# Patient Record
Sex: Female | Born: 1965 | Race: Black or African American | Hispanic: No | Marital: Single | Smoking: Never smoker
Health system: Southern US, Community
[De-identification: ages and names within clinical notes are randomized; demographics above are authoritative.]

## PROBLEM LIST (undated history)

## (undated) DIAGNOSIS — R0602 Shortness of breath: Secondary | ICD-10-CM

## (undated) DIAGNOSIS — G932 Benign intracranial hypertension: Secondary | ICD-10-CM

## (undated) DIAGNOSIS — Z9989 Dependence on other enabling machines and devices: Secondary | ICD-10-CM

## (undated) DIAGNOSIS — M549 Dorsalgia, unspecified: Secondary | ICD-10-CM

## (undated) DIAGNOSIS — G4733 Obstructive sleep apnea (adult) (pediatric): Secondary | ICD-10-CM

## (undated) DIAGNOSIS — R1013 Epigastric pain: Secondary | ICD-10-CM

## (undated) DIAGNOSIS — I10 Essential (primary) hypertension: Secondary | ICD-10-CM

## (undated) DIAGNOSIS — G43909 Migraine, unspecified, not intractable, without status migrainosus: Secondary | ICD-10-CM

## (undated) HISTORY — DX: Epigastric pain: R10.13

## (undated) HISTORY — DX: Obstructive sleep apnea (adult) (pediatric): G47.33

## (undated) HISTORY — DX: Dependence on other enabling machines and devices: Z99.89

## (undated) HISTORY — DX: Migraine, unspecified, not intractable, without status migrainosus: G43.909

## (undated) HISTORY — DX: Benign intracranial hypertension: G93.2

## (undated) HISTORY — DX: Dorsalgia, unspecified: M54.9

## (undated) HISTORY — DX: Shortness of breath: R06.02

---

## 2010-02-16 ENCOUNTER — Encounter: Admission: RE | Admit: 2010-02-16 | Discharge: 2010-02-16 | Payer: Self-pay | Admitting: Obstetrics and Gynecology

## 2010-03-04 ENCOUNTER — Ambulatory Visit (HOSPITAL_COMMUNITY): Admission: RE | Admit: 2010-03-04 | Discharge: 2010-03-04 | Payer: Self-pay | Admitting: Cardiology

## 2010-03-04 ENCOUNTER — Encounter (INDEPENDENT_AMBULATORY_CARE_PROVIDER_SITE_OTHER): Payer: Self-pay | Admitting: Cardiology

## 2010-04-12 ENCOUNTER — Encounter: Admission: RE | Admit: 2010-04-12 | Discharge: 2010-04-12 | Payer: Self-pay | Admitting: Obstetrics and Gynecology

## 2010-07-27 ENCOUNTER — Inpatient Hospital Stay (HOSPITAL_COMMUNITY): Admission: AD | Admit: 2010-07-27 | Discharge: 2010-07-27 | Payer: Self-pay | Admitting: Family Medicine

## 2010-10-21 ENCOUNTER — Ambulatory Visit
Admission: RE | Admit: 2010-10-21 | Discharge: 2010-10-21 | Payer: Self-pay | Source: Home / Self Care | Attending: Internal Medicine | Admitting: Internal Medicine

## 2011-01-20 LAB — WET PREP, GENITAL: Yeast Wet Prep HPF POC: NONE SEEN

## 2011-01-20 LAB — URINALYSIS, ROUTINE W REFLEX MICROSCOPIC
Bilirubin Urine: NEGATIVE
Ketones, ur: NEGATIVE mg/dL
Leukocytes, UA: NEGATIVE
Nitrite: NEGATIVE
Protein, ur: NEGATIVE mg/dL
pH: 5.5 (ref 5.0–8.0)

## 2011-01-20 LAB — GC/CHLAMYDIA PROBE AMP, GENITAL
Chlamydia, DNA Probe: NEGATIVE
GC Probe Amp, Genital: NEGATIVE

## 2011-01-20 LAB — URINE MICROSCOPIC-ADD ON

## 2011-03-28 ENCOUNTER — Other Ambulatory Visit: Payer: Self-pay | Admitting: Obstetrics and Gynecology

## 2011-03-30 ENCOUNTER — Other Ambulatory Visit: Payer: Self-pay

## 2011-03-30 ENCOUNTER — Other Ambulatory Visit: Payer: Self-pay | Admitting: Obstetrics and Gynecology

## 2011-03-30 ENCOUNTER — Other Ambulatory Visit (HOSPITAL_COMMUNITY)
Admission: RE | Admit: 2011-03-30 | Discharge: 2011-03-30 | Disposition: A | Discharge status: Home / Self Care | Payer: Medicaid Other | Source: Ambulatory Visit | Attending: Obstetrics and Gynecology | Admitting: Obstetrics and Gynecology

## 2011-03-30 DIAGNOSIS — Z1231 Encounter for screening mammogram for malignant neoplasm of breast: Secondary | ICD-10-CM

## 2011-03-30 DIAGNOSIS — Z113 Encounter for screening for infections with a predominantly sexual mode of transmission: Secondary | ICD-10-CM | POA: Insufficient documentation

## 2011-03-30 DIAGNOSIS — Z124 Encounter for screening for malignant neoplasm of cervix: Secondary | ICD-10-CM | POA: Insufficient documentation

## 2011-04-07 ENCOUNTER — Ambulatory Visit
Admission: RE | Admit: 2011-04-07 | Discharge: 2011-04-07 | Disposition: A | Discharge status: Home / Self Care | Payer: Medicaid Other | Source: Ambulatory Visit | Attending: Obstetrics and Gynecology | Admitting: Obstetrics and Gynecology

## 2011-04-07 DIAGNOSIS — Z1231 Encounter for screening mammogram for malignant neoplasm of breast: Secondary | ICD-10-CM

## 2011-07-29 ENCOUNTER — Emergency Department (HOSPITAL_COMMUNITY): Payer: Medicaid Other

## 2011-07-29 ENCOUNTER — Encounter (HOSPITAL_COMMUNITY): Payer: Self-pay | Admitting: Radiology

## 2011-07-29 ENCOUNTER — Emergency Department (HOSPITAL_COMMUNITY)
Admission: EM | Admit: 2011-07-29 | Discharge: 2011-07-29 | Disposition: A | Discharge status: Home / Self Care | Payer: Medicaid Other | Attending: Emergency Medicine | Admitting: Emergency Medicine

## 2011-07-29 DIAGNOSIS — I10 Essential (primary) hypertension: Secondary | ICD-10-CM | POA: Insufficient documentation

## 2011-07-29 DIAGNOSIS — R079 Chest pain, unspecified: Secondary | ICD-10-CM | POA: Insufficient documentation

## 2011-07-29 DIAGNOSIS — R51 Headache: Secondary | ICD-10-CM | POA: Insufficient documentation

## 2011-07-29 DIAGNOSIS — G932 Benign intracranial hypertension: Secondary | ICD-10-CM | POA: Insufficient documentation

## 2011-07-29 HISTORY — DX: Essential (primary) hypertension: I10

## 2011-07-29 LAB — D-DIMER, QUANTITATIVE: D-Dimer, Quant: 0.39 ug/mL-FEU (ref 0.00–0.48)

## 2011-07-29 LAB — DIFFERENTIAL
Eosinophils Absolute: 0.1 10*3/uL (ref 0.0–0.7)
Eosinophils Relative: 2 % (ref 0–5)
Lymphocytes Relative: 36 % (ref 12–46)
Lymphs Abs: 1.5 10*3/uL (ref 0.7–4.0)
Monocytes Absolute: 0.4 10*3/uL (ref 0.1–1.0)

## 2011-07-29 LAB — COMPREHENSIVE METABOLIC PANEL
BUN: 12 mg/dL (ref 6–23)
CO2: 26 mEq/L (ref 19–32)
Calcium: 8.9 mg/dL (ref 8.4–10.5)
Creatinine, Ser: 0.85 mg/dL (ref 0.50–1.10)
GFR calc Af Amer: >60 mL/min (ref >60)
GFR calc non Af Amer: >60 mL/min (ref >60)
Glucose, Bld: 115 mg/dL — ABNORMAL HIGH (ref 70–99)
Total Protein: 7.4 g/dL (ref 6.0–8.3)

## 2011-07-29 LAB — CBC
HCT: 35.7 % — ABNORMAL LOW (ref 36.0–46.0)
MCHC: 33.3 g/dL (ref 30.0–36.0)
MCV: 87.5 fL (ref 78.0–100.0)
Platelets: 248 10*3/uL (ref 150–400)
RDW: 12.6 % (ref 11.5–15.5)

## 2011-07-29 LAB — POCT I-STAT TROPONIN I: Troponin i, poc: 0 ng/mL (ref 0.00–0.08)

## 2011-12-01 ENCOUNTER — Encounter (HOSPITAL_COMMUNITY): Payer: Self-pay

## 2011-12-01 ENCOUNTER — Other Ambulatory Visit: Payer: Self-pay

## 2011-12-01 ENCOUNTER — Emergency Department (HOSPITAL_COMMUNITY): Payer: Medicaid Other

## 2011-12-01 ENCOUNTER — Emergency Department (HOSPITAL_COMMUNITY)
Admission: EM | Admit: 2011-12-01 | Discharge: 2011-12-01 | Disposition: A | Discharge status: Home / Self Care | Payer: Medicaid Other | Attending: Emergency Medicine | Admitting: Emergency Medicine

## 2011-12-01 DIAGNOSIS — R079 Chest pain, unspecified: Secondary | ICD-10-CM | POA: Insufficient documentation

## 2011-12-01 DIAGNOSIS — R609 Edema, unspecified: Secondary | ICD-10-CM | POA: Insufficient documentation

## 2011-12-01 DIAGNOSIS — M25473 Effusion, unspecified ankle: Secondary | ICD-10-CM | POA: Insufficient documentation

## 2011-12-01 DIAGNOSIS — I10 Essential (primary) hypertension: Secondary | ICD-10-CM | POA: Insufficient documentation

## 2011-12-01 DIAGNOSIS — M25476 Effusion, unspecified foot: Secondary | ICD-10-CM | POA: Insufficient documentation

## 2011-12-01 DIAGNOSIS — R0602 Shortness of breath: Secondary | ICD-10-CM | POA: Insufficient documentation

## 2011-12-01 LAB — COMPREHENSIVE METABOLIC PANEL
AST: 30 U/L (ref 0–37)
BUN: 12 mg/dL (ref 6–23)
CO2: 25 mEq/L (ref 19–32)
Calcium: 9.2 mg/dL (ref 8.4–10.5)
Creatinine, Ser: 0.88 mg/dL (ref 0.50–1.10)
GFR calc Af Amer: >90 mL/min (ref >90)
GFR calc non Af Amer: 79 mL/min — ABNORMAL LOW (ref >90)

## 2011-12-01 LAB — CBC
MCH: 29 pg (ref 26.0–34.0)
MCV: 89.4 fL (ref 78.0–100.0)
Platelets: 248 10*3/uL (ref 150–400)
RBC: 4.17 MIL/uL (ref 3.87–5.11)

## 2011-12-01 LAB — POCT I-STAT TROPONIN I
Troponin i, poc: 0 ng/mL (ref 0.00–0.08)
Troponin i, poc: 0 ng/mL (ref 0.00–0.08)

## 2011-12-01 MED ORDER — ASPIRIN 81 MG PO CHEW
324.0000 mg | CHEWABLE_TABLET | Freq: Once | ORAL | Status: AC
  Administered 2011-12-01: 324 mg via ORAL
  Filled 2011-12-01: qty 4

## 2011-12-01 MED ORDER — ONDANSETRON HCL 4 MG/2ML IJ SOLN
4.0000 mg | Freq: Once | INTRAMUSCULAR | Status: AC
  Administered 2011-12-01: 4 mg via INTRAVENOUS
  Filled 2011-12-01: qty 2

## 2011-12-01 MED ORDER — SODIUM CHLORIDE 0.9 % IV SOLN
20.0000 mL | INTRAVENOUS | Status: DC
  Administered 2011-12-01: 20 mL via INTRAVENOUS

## 2011-12-01 MED ORDER — MORPHINE SULFATE 4 MG/ML IJ SOLN: 4.0000 mg | Freq: Once | INTRAMUSCULAR | Status: DC

## 2011-12-01 NOTE — ED Provider Notes (Signed)
History     CSN: [id number]  Arrival date & time 12/01/11  0907   First MD Initiated Contact with Patient 12/01/11 [phone]      Chief Complaint  Patient presents with  . Chest Pain    (Consider location/radiation/quality/duration/timing/severity/associated sxs/prior treatment) HPI Comments: Patient w a hx of HTN and obesity presents to the emergency department with a chief complaint of left-sided chest pain.  Her pain started last night was intermittent in nature and lasts a couple of minutes.  Patient states she woke up this morning and the pain still comes and goes.  She describes it as a pinching on and off, ranks it as a 4/10 in severity and denies radiation.  The patient did not have any left arm pain or jaw pain.  Patient states that her pain did not get worse with exercise or deep breathing.  Patient has not taken any medications to alleviate symptoms and is unaware of alleviating factors.  Patient denies shortness of breath or dyspnea on exertion and claudication.  She states that she has chronic bilateral ankle swelling occasionally.  Patient also reports that she has sleep apnea but does not currently use a machine.  Patient's last echo was performed on able 28th 2011 showing an ejection fraction of 60-65%.  At this time she had a stress test as well within normal limits.  The patient's cardiologist is Dr. Mayford Knife but she has not seen her since this last evaluation  Patient is a 46 y.o. female presenting with chest pain.  Chest Pain Primary symptoms include shortness of breath. Pertinent negatives for primary symptoms include no fever, no fatigue, no cough, no wheezing, no palpitations, no abdominal pain, no nausea, no vomiting and no dizziness.  Pertinent negatives for associated symptoms include no diaphoresis.     Past Medical History  Diagnosis Date  . Hypertension     History reviewed. No pertinent past surgical history.  No family history on file.  History  Substance Use  Topics  . Smoking status: Not on file  . Smokeless tobacco: Not on file  . Alcohol Use: Not on file    OB History    Grav Para Term Preterm Abortions TAB SAB Ect Mult Living                  Review of Systems  Constitutional: Positive for activity change. Negative for fever, chills, diaphoresis, fatigue and unexpected weight change.  HENT: Negative for congestion, neck pain and neck stiffness.   Eyes: Negative for visual disturbance.  Respiratory: Positive for chest tightness and shortness of breath. Negative for apnea, cough, wheezing and stridor.   Cardiovascular: Positive for chest pain. Negative for palpitations and leg swelling.  Gastrointestinal: Negative for nausea, vomiting, abdominal pain, diarrhea and blood in stool.  Genitourinary: Negative for dysuria, urgency, hematuria and flank pain.  Musculoskeletal: Negative for myalgias, back pain and gait problem.  Skin: Negative for pallor.  Neurological: Negative for dizziness, syncope, light-headedness and headaches.  All other systems reviewed and are negative.    Allergies  Lotrel  Home Medications  No current outpatient prescriptions on file.  BP 131/75  Pulse 86  Temp(Src) 98 F (36.7 C) (Oral)  Resp 21  SpO2 96%  LMP 11/24/2011  Physical Exam  Nursing note and vitals reviewed. Constitutional: She appears well-developed and well-nourished. No distress.  HENT:  Head: Normocephalic and atraumatic.  Eyes: Conjunctivae and EOM are normal. Pupils are equal, round, and reactive to light.  Neck: Normal  range of motion. Neck supple. Normal carotid pulses and no JVD present. Carotid bruit is not present. No rigidity. Normal range of motion present.  Cardiovascular: Normal rate, regular rhythm, S1 normal, S2 normal, normal heart sounds, intact distal pulses and normal pulses.  Exam reveals no gallop and no friction rub.   No murmur heard.      No pitting edema bilaterally, RRR, no aberrant sounds on auscultations,  distal pulses intact, no carotid bruit or JVD.   Pulmonary/Chest: Effort normal and breath sounds normal. No accessory muscle usage or stridor. No respiratory distress. She exhibits no tenderness and no bony tenderness.       Chest symmetry with respirations, no wheezes, rhonchi or rales, No reproducible chest wall tenderness.   Abdominal: Bowel sounds are normal.       Soft non tender. Non pulsatile aorta.   Skin: Skin is warm, dry and intact. No rash noted. She is not diaphoretic. No cyanosis. Nails show no clubbing.    ED Course  Procedures (including critical care time)  Labs Reviewed - No data to display No results found.   No diagnosis found.   Date: 12/01/2011  Rate: 81  Rhythm: normal sinus rhythm  QRS Axis: normal  Intervals: normal  ST/T Wave abnormalities: normal  Conduction Disutrbances:none  Narrative Interpretation:   Old EKG Reviewed: No significant changes noted  Plan is to check troponin x2, labs & CXR. If WNL pt will be dc with instructions to f-u w Dr. Mayford Knife, her cardiologist.  Re evaluated prior to dc: Gen NAD; Heart RRR, nml S1,S2, no m/r/g; Lungs CTAB; Abd soft, NT, no rebound or guarding; Ext 2+ pedal pulses bilaterally, no edema.  MDM  Chest Pain  Pt being discharged with instructions to follow up w cardiologist next week for further work up and labs. Pts pain is non exertional, findings in the ED are negative for acute MI, and pt appears to be reliable to follow up as an OP for her CP. Reasons to return to ED have been discussed. Pt is agreeable with above plan.      Linda Cunningham, [place] 12/01/11 1252

## 2011-12-01 NOTE — ED Notes (Signed)
Patient transported to X-ray 

## 2011-12-01 NOTE — ED Notes (Signed)
Urine sample collected from patient if needed.

## 2011-12-01 NOTE — ED Notes (Signed)
Pt states left upper chest pain started last night continuing through the night into this morning. She also stated that she has had this same pain before over the last few months. No SOB per pt.

## 2011-12-01 NOTE — ED Provider Notes (Signed)
Medical screening examination/treatment/procedure(s) were conducted as a shared visit with non-physician practitioner(s) and myself.  I personally evaluated the patient during the encounter  Cyndra Numbers, MD 12/01/11 2242

## 2011-12-01 NOTE — ED Notes (Signed)
Pt presents with L sided chest pain since last night.  Pt reports pain is intermittent and does not radiate. -shortness of breath or nausea.  Pt has had similar episodes.

## 2011-12-01 NOTE — ED Notes (Signed)
NAD noted at d/c. Pt verbalized understanding of d/c instructions  

## 2012-04-02 ENCOUNTER — Other Ambulatory Visit (HOSPITAL_COMMUNITY)
Admission: RE | Admit: 2012-04-02 | Discharge: 2012-04-02 | Disposition: A | Discharge status: Home / Self Care | Payer: Medicaid Other | Source: Ambulatory Visit | Attending: Obstetrics and Gynecology | Admitting: Obstetrics and Gynecology

## 2012-04-02 ENCOUNTER — Other Ambulatory Visit: Payer: Self-pay | Admitting: Obstetrics and Gynecology

## 2012-04-02 DIAGNOSIS — Z124 Encounter for screening for malignant neoplasm of cervix: Secondary | ICD-10-CM | POA: Insufficient documentation

## 2012-04-04 ENCOUNTER — Other Ambulatory Visit: Payer: Self-pay | Admitting: Obstetrics and Gynecology

## 2012-04-04 DIAGNOSIS — Z1231 Encounter for screening mammogram for malignant neoplasm of breast: Secondary | ICD-10-CM

## 2012-04-13 ENCOUNTER — Ambulatory Visit
Admission: RE | Admit: 2012-04-13 | Discharge: 2012-04-13 | Disposition: A | Discharge status: Home / Self Care | Payer: Medicaid Other | Source: Ambulatory Visit | Attending: Obstetrics and Gynecology | Admitting: Obstetrics and Gynecology

## 2012-04-13 DIAGNOSIS — Z1231 Encounter for screening mammogram for malignant neoplasm of breast: Secondary | ICD-10-CM

## 2012-04-27 ENCOUNTER — Ambulatory Visit: Payer: Medicaid Other

## 2013-03-13 ENCOUNTER — Other Ambulatory Visit: Payer: Self-pay

## 2013-03-18 ENCOUNTER — Other Ambulatory Visit: Payer: Self-pay | Admitting: Family Medicine

## 2013-03-18 DIAGNOSIS — N644 Mastodynia: Secondary | ICD-10-CM

## 2013-04-15 ENCOUNTER — Other Ambulatory Visit: Payer: Self-pay | Admitting: Family Medicine

## 2013-04-15 ENCOUNTER — Ambulatory Visit
Admission: RE | Admit: 2013-04-15 | Discharge: 2013-04-15 | Disposition: A | Discharge status: Home / Self Care | Payer: Medicaid Other | Source: Ambulatory Visit | Attending: Family Medicine | Admitting: Family Medicine

## 2013-04-15 DIAGNOSIS — N644 Mastodynia: Secondary | ICD-10-CM

## 2013-11-15 ENCOUNTER — Other Ambulatory Visit: Payer: Self-pay | Admitting: Family Medicine

## 2013-11-15 DIAGNOSIS — N644 Mastodynia: Secondary | ICD-10-CM

## 2013-11-25 ENCOUNTER — Other Ambulatory Visit: Payer: Self-pay | Admitting: Family Medicine

## 2013-11-25 ENCOUNTER — Ambulatory Visit
Admission: RE | Admit: 2013-11-25 | Discharge: 2013-11-25 | Disposition: A | Discharge status: Home / Self Care | Payer: Medicaid Other | Source: Ambulatory Visit | Attending: Family Medicine | Admitting: Family Medicine

## 2013-11-25 DIAGNOSIS — N644 Mastodynia: Secondary | ICD-10-CM

## 2013-12-09 ENCOUNTER — Other Ambulatory Visit: Payer: Self-pay | Admitting: Family Medicine

## 2013-12-09 DIAGNOSIS — N644 Mastodynia: Secondary | ICD-10-CM

## 2013-12-11 ENCOUNTER — Encounter: Payer: Self-pay | Admitting: Cardiology

## 2013-12-11 ENCOUNTER — Encounter: Payer: Self-pay | Admitting: General Surgery

## 2013-12-11 ENCOUNTER — Ambulatory Visit
Admission: RE | Admit: 2013-12-11 | Discharge: 2013-12-11 | Disposition: A | Discharge status: Home / Self Care | Payer: Medicaid Other | Source: Ambulatory Visit | Attending: Family Medicine | Admitting: Family Medicine

## 2013-12-11 DIAGNOSIS — N644 Mastodynia: Secondary | ICD-10-CM

## 2013-12-11 DIAGNOSIS — I10 Essential (primary) hypertension: Secondary | ICD-10-CM

## 2013-12-11 HISTORY — DX: Essential (primary) hypertension: I10

## 2013-12-24 ENCOUNTER — Ambulatory Visit: Payer: Medicaid Other | Admitting: Cardiology

## 2014-01-21 ENCOUNTER — Encounter: Payer: Self-pay | Admitting: Cardiology

## 2014-01-21 ENCOUNTER — Ambulatory Visit (INDEPENDENT_AMBULATORY_CARE_PROVIDER_SITE_OTHER): Payer: Medicaid Other | Admitting: Cardiology

## 2014-01-21 VITALS — BP 138/74 | HR 82 | Ht 60.0 in | Wt 244.0 lb

## 2014-01-21 DIAGNOSIS — I10 Essential (primary) hypertension: Secondary | ICD-10-CM

## 2014-01-21 NOTE — Patient Instructions (Addendum)
Your physician recommends that you continue on your current medications as directed. Please refer to the Current Medication list given to you today.  Your physician wants you to follow-up in: 12 Months with Dr Turner You will receive a reminder letter in the mail two months in advance. If you don't receive a letter, please call our office to schedule the follow-up appointment.  

## 2014-01-21 NOTE — Progress Notes (Signed)
  [address] [place], [place]  [address] Phone: [phone] Fax:  [phone]  Date:  01/21/2014   ID:  Margarita Ranandrea D Adebayo, DOB 1966/05/23, MRN [id number]  PCP:  Jackie PlumSEI-BONSU,GEORGE, MD  Cardiologist:  Armanda Magicraci Turner, MD     History of Present Illness: Margarita Ranandrea D Borsuk is a 48 y.o. female with a history of HTN who presents today for followup.  She is doing well.  She denies any SOB, DOE, LE edema, dizziness, palpitations or syncope.  She occasionally has a pinching sensation in her chest over her sternum.   Wt Readings from Last 3 Encounters:  01/21/14 244 lb (110.678 kg)     Past Medical History  Diagnosis Date  . Hypertension   . Pseudotumor cerebri     Current Outpatient Prescriptions  Medication Sig Dispense Refill  . acetaZOLAMIDE (DIAMOX) 125 MG tablet Take 125 mg by mouth 2 (two) times daily.      Marland Kitchen. amLODipine (NORVASC) 5 MG tablet Take 5 mg by mouth daily.      Marland Kitchen. ibuprofen (ADVIL,MOTRIN) 200 MG tablet Take 400 mg by mouth every 6 (six) hours as needed. For pain      . olmesartan (BENICAR) 20 MG tablet Take 20 mg by mouth daily.      Marland Kitchen. topiramate (TOPAMAX) 25 MG capsule Take 75 mg by mouth daily.       No current facility-administered medications for this visit.    Allergies:    Allergies  Allergen Reactions  . Amlodipine Besy-Benazepril Hcl Swelling    LIP swelling    Social History:  The patient  reports that she has never smoked. She does not have any smokeless tobacco history on file. She reports that she does not drink alcohol or use illicit drugs.   Family History:  The patient's family history includes Coronary artery disease in her brother; Hypertension in her brother, mother, and sister.   ROS:  Please see the history of present illness.      All other systems reviewed and negative.   PHYSICAL EXAM: VS:  BP 138/74  Pulse 82  Ht 5' (1.524 m)  Wt 244 lb (110.678 kg)  BMI 47.65 kg/m2 Well nourished, well developed, in no acute  distress HEENT: normal Neck: no JVD Cardiac:  normal S1, S2; RRR; no murmur Lungs:  clear to auscultation bilaterally, no wheezing, rhonchi or rales Abd: soft, nontender, no hepatomegaly Ext: no edema Skin: warm and dry Neuro:  CNs 2-12 intact, no focal abnormalities noted      EKG:  NSR  ASSESSMENT AND PLAN:  1. HTN well controlled on current regimen.  - continue amlodipine/Benicar  Followup with me in 6 months  Signed, Armanda Magicraci Turner, MD 01/21/2014 8:28 AM

## 2014-04-09 ENCOUNTER — Other Ambulatory Visit: Payer: Self-pay

## 2014-04-09 DIAGNOSIS — Z1231 Encounter for screening mammogram for malignant neoplasm of breast: Secondary | ICD-10-CM

## 2014-04-21 ENCOUNTER — Ambulatory Visit
Admission: RE | Admit: 2014-04-21 | Discharge: 2014-04-21 | Disposition: A | Discharge status: Home / Self Care | Payer: Medicaid Other | Source: Ambulatory Visit

## 2014-04-21 DIAGNOSIS — Z1231 Encounter for screening mammogram for malignant neoplasm of breast: Secondary | ICD-10-CM

## 2014-07-10 ENCOUNTER — Telehealth: Payer: Self-pay | Admitting: Cardiology

## 2014-07-10 NOTE — Telephone Encounter (Signed)
New message      Pt said we have her PCP listed wrong.  Her PCP is Dr Anna Genre at [facility] [phone]

## 2014-07-10 NOTE — Telephone Encounter (Signed)
Pt's chart updated with correct PCP.

## 2014-07-21 ENCOUNTER — Encounter: Payer: Self-pay | Admitting: *Deleted

## 2014-07-21 ENCOUNTER — Encounter: Payer: Medicaid Other | Attending: Family Medicine | Admitting: *Deleted

## 2014-07-21 VITALS — Ht <= 58 in | Wt 247.6 lb

## 2014-07-21 DIAGNOSIS — Z6841 Body Mass Index (BMI) 40.0 and over, adult: Secondary | ICD-10-CM | POA: Diagnosis not present

## 2014-07-21 DIAGNOSIS — E669 Obesity, unspecified: Secondary | ICD-10-CM | POA: Insufficient documentation

## 2014-07-21 DIAGNOSIS — Z713 Dietary counseling and surveillance: Secondary | ICD-10-CM | POA: Diagnosis not present

## 2014-07-21 NOTE — Progress Notes (Signed)
Medical Nutrition Therapy:  Appt start time: 1045 end time:  1145  Assessment:  Patient here today for weight management. Patient desires weight loss. Her weight is down about 3.5 pounds over the last month. She tries to cook healthy, but states that she isn't always sure what to eat. She also has a sweet tooth, finding it hard to resist eating sweets when they are brought into the house. 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 Although, she does plan on joining the aquatic center to do pool exercises.   MEDICATIONS: See list   DIETARY INTAKE:   Usual eating pattern includes 3 meals and 2 snacks per day.  24-hr recall:  B ( AM): Oatmeal with 2% milk and pumpkin/sweet potato puree, raisins, water, juice Snk ( AM): None L ( PM): Sandwich ([place], cheese, mayo), fruit, water Snk ( PM): Chips, sweets, PB crackers D ( PM): [place] chili/meatloaf, non-starchy vegetables OR spaghetti, water/sweet tea Snk ( PM): Same Beverages: Water, juice, sweet tea, Kool-aid  Usual physical activity: None currently, planning on swimming  Estimated energy needs: 1500 calories 188 g carbohydrates 94 g protein 42 g fat  Progress Towards Goal(s):  In progress.   Nutritional Diagnosis:  [place]-3.3 Overweight/obesity As related to excessive intake of sweets and physical inactivity.  As evidenced by BMI >30.    Intervention:  Nutrition counseling. We discussed strategies for weight loss, including balancing nutrients (carbs, protein, fat), portion control, healthy snacks, and exercise.   Goals:  1. 1 pound weight loss per week. Goal weight: <200 pounds 2. Use plate method for meal planning 3. Read food labels for calories and serving size.  4. Choose healthy snacks 1-2 times daily for a maximum of 300 calories per day (fruit, lactose-free yogurt, graham crackers) 5. Work up to swimming and/or walking 20-30 minutes 3 days weekly.  6. Limit intake of juice and sweetened drinks.   Handouts  given during visit include:  Weight loss tips  Meal plan card  Monitoring/Evaluation:  Dietary intake, exercise, and body weight prn.

## 2014-11-13 ENCOUNTER — Encounter: Payer: Self-pay | Admitting: Cardiology

## 2014-12-15 ENCOUNTER — Telehealth: Payer: Self-pay | Admitting: Cardiology

## 2014-12-15 NOTE — Telephone Encounter (Signed)
Patient  C/o moderate chest tightness for 2 days.  She st it started a couple days ago when [place] and it stayed for the evening. She has had intermittent mild to moderate chest tightness and discomfort since then. Pt st she has gained about 5 pounds in the last couple weeks. She thinks some might be swelling but she has also been eating extremely poorly as well.  She does not have any SOB or other symptoms.  To Dr. Mayford Knifeurner for recommendations.

## 2014-12-15 NOTE — Telephone Encounter (Signed)
Needs to go to ER

## 2014-12-15 NOTE — Telephone Encounter (Signed)
New problem    Pt stated she is having chest pressure for a few days. Pt stated it isn't chest pain. Pt want to speak to nurse.

## 2014-12-15 NOTE — Telephone Encounter (Signed)
Informed patient to go to the ER. Patient st she is not having any symptoms now, but will go if she does. Instructed patient to F/U with us later this week.

## 2014-12-21 ENCOUNTER — Encounter (HOSPITAL_COMMUNITY): Payer: Self-pay | Admitting: Emergency Medicine

## 2014-12-21 ENCOUNTER — Emergency Department (HOSPITAL_COMMUNITY)
Admission: EM | Admit: 2014-12-21 | Discharge: 2014-12-21 | Disposition: A | Discharge status: Home / Self Care | Payer: Medicaid Other | Attending: Emergency Medicine | Admitting: Emergency Medicine

## 2014-12-21 ENCOUNTER — Emergency Department (HOSPITAL_COMMUNITY): Payer: Medicaid Other

## 2014-12-21 DIAGNOSIS — Z79899 Other long term (current) drug therapy: Secondary | ICD-10-CM | POA: Diagnosis not present

## 2014-12-21 DIAGNOSIS — R079 Chest pain, unspecified: Secondary | ICD-10-CM | POA: Insufficient documentation

## 2014-12-21 DIAGNOSIS — I10 Essential (primary) hypertension: Secondary | ICD-10-CM | POA: Diagnosis not present

## 2014-12-21 DIAGNOSIS — Z8669 Personal history of other diseases of the nervous system and sense organs: Secondary | ICD-10-CM | POA: Diagnosis not present

## 2014-12-21 LAB — I-STAT TROPONIN, ED: Troponin i, poc: 0 ng/mL (ref 0.00–0.08)

## 2014-12-21 LAB — CBC
HCT: 37.7 % (ref 36.0–46.0)
Hemoglobin: 11.7 g/dL — ABNORMAL LOW (ref 12.0–15.0)
MCH: 28.6 pg (ref 26.0–34.0)
MCHC: 31 g/dL (ref 30.0–36.0)
MCV: 92.2 fL (ref 78.0–100.0)
PLATELETS: 309 10*3/uL (ref 150–400)
RBC: 4.09 MIL/uL (ref 3.87–5.11)
RDW: 13.1 % (ref 11.5–15.5)
WBC: 4.6 10*3/uL (ref 4.0–10.5)

## 2014-12-21 LAB — BASIC METABOLIC PANEL
Anion gap: 6 (ref 5–15)
BUN: 17 mg/dL (ref 6–23)
CO2: 23 mmol/L (ref 19–32)
Calcium: 9 mg/dL (ref 8.4–10.5)
Chloride: 107 mmol/L (ref 96–112)
Creatinine, Ser: 0.98 mg/dL (ref 0.50–1.10)
GFR calc Af Amer: 79 mL/min — ABNORMAL LOW (ref >90)
GFR, EST NON AFRICAN AMERICAN: 68 mL/min — AB (ref >90)
Glucose, Bld: 119 mg/dL — ABNORMAL HIGH (ref 70–99)
POTASSIUM: 3.9 mmol/L (ref 3.5–5.1)
Sodium: 136 mmol/L (ref 135–145)

## 2014-12-21 LAB — BRAIN NATRIURETIC PEPTIDE: B Natriuretic Peptide: 28.4 pg/mL (ref 0.0–100.0)

## 2014-12-21 MED ORDER — ASPIRIN 81 MG PO CHEW
324.0000 mg | CHEWABLE_TABLET | Freq: Once | ORAL | Status: AC
  Administered 2014-12-21: 324 mg via ORAL
  Filled 2014-12-21: qty 4

## 2014-12-21 MED ORDER — GI COCKTAIL ~~LOC~~
30.0000 mL | Freq: Once | ORAL | Status: AC
  Administered 2014-12-21: 30 mL via ORAL
  Filled 2014-12-21: qty 30

## 2014-12-21 NOTE — Discharge Instructions (Signed)
Take maalox or pepcid at night time. Take aspirin 81mg  daily. Follow up with Dr. Mayford Knifeurner in the office as soon as able. Call tomorrow. Return if worsening symptoms.    Chest Pain (Nonspecific) It is often hard to give a specific diagnosis for the cause of chest pain. There is always a chance that your pain could be related to something serious, such as a heart attack or a blood clot in the lungs. You need to follow up with your health care provider for further evaluation. CAUSES   Heartburn.  Pneumonia or bronchitis.  Anxiety or stress.  Inflammation around your heart (pericarditis) or lung (pleuritis or pleurisy).  A blood clot in the lung.  A collapsed lung (pneumothorax). It can develop suddenly on its own (spontaneous pneumothorax) or from trauma to the chest.  Shingles infection (herpes zoster virus). The chest wall is composed of bones, muscles, and cartilage. Any of these can be the source of the pain.  The bones can be bruised by injury.  The muscles or cartilage can be strained by coughing or overwork.  The cartilage can be affected by inflammation and become sore (costochondritis). DIAGNOSIS  Lab tests or other studies may be needed to find the cause of your pain. Your health care provider may have you take a test called an ambulatory electrocardiogram (ECG). An ECG records your heartbeat patterns over a 24-hour period. You may also have other tests, such as:  Transthoracic echocardiogram (TTE). During echocardiography, sound waves are used to evaluate how blood flows through your heart.  Transesophageal echocardiogram (TEE).  Cardiac monitoring. This allows your health care provider to monitor your heart rate and rhythm in real time.  Holter monitor. This is a portable device that records your heartbeat and can help diagnose heart arrhythmias. It allows your health care provider to track your heart activity for several days, if needed.  Stress tests by exercise or by  giving medicine that makes the heart beat faster. TREATMENT   Treatment depends on what may be causing your chest pain. Treatment may include:  Acid blockers for heartburn.  Anti-inflammatory medicine.  Pain medicine for inflammatory conditions.  Antibiotics if an infection is present.  You may be advised to change lifestyle habits. This includes stopping smoking and avoiding alcohol, caffeine, and chocolate.  You may be advised to keep your head raised (elevated) when sleeping. This reduces the chance of acid going backward from your stomach into your esophagus. Most of the time, nonspecific chest pain will improve within 2-3 days with rest and mild pain medicine.  HOME CARE INSTRUCTIONS   If antibiotics were prescribed, take them as directed. Finish them even if you start to feel better.  For the next few days, avoid physical activities that bring on chest pain. Continue physical activities as directed.  Do not use any tobacco products, including cigarettes, chewing tobacco, or electronic cigarettes.  Avoid drinking alcohol.  Only take medicine as directed by your health care provider.  Follow your health care provider's suggestions for further testing if your chest pain does not go away.  Keep any follow-up appointments you made. If you do not go to an appointment, you could develop lasting (chronic) problems with pain. If there is any problem keeping an appointment, call to reschedule. SEEK MEDICAL CARE IF:   Your chest pain does not go away, even after treatment.  You have a rash with blisters on your chest.  You have a fever. SEEK IMMEDIATE MEDICAL CARE IF:  You have increased chest pain or pain that spreads to your arm, neck, jaw, back, or abdomen.  You have shortness of breath.  You have an increasing cough, or you cough up blood.  You have severe back or abdominal pain.  You feel nauseous or vomit.  You have severe weakness.  You faint.  You have  chills. This is an emergency. Do not wait to see if the pain will go away. Get medical help at once. Call your local emergency services (911 in [place] Do not drive yourself to the hospital. MAKE SURE YOU:   Understand these instructions.  Will watch your condition.  Will get help right away if you are not doing well or get worse. Document Released: 08/03/2005 Document Revised: 10/29/2013 Document Reviewed: 05/29/2008 [facility] Patient Information 2015 [place], [place]. This information is not intended to replace advice given to you by your health care provider. Make sure you discuss any questions you have with your health care provider.

## 2014-12-21 NOTE — ED Provider Notes (Signed)
CSN: [id number]     Arrival date & time 12/21/14  1908 History   First MD Initiated Contact with Patient 12/21/14 1921     Chief Complaint  Patient presents with  . Chest Pain     (Consider location/radiation/quality/duration/timing/severity/associated sxs/prior Treatment) HPI Linda Cunningham is a 49 y.o. female  With hx of hypertension and pseudotumor cerebri, presents to ED complaining of chest pain. Pt states pain is 'more of a discomfort' in the left chest radiating down left arm. States arm feels "tingly." States started this morning. Reports similar episodes in the last several days, that are worse with exertion. Some associated SOB. No dizziness or diaphoresis. Hx of prior work up for cp, states had stress test that was normal several years ago. States she is not a smoker. Unsure of her cholesterol. Heart disease in mother and brother.    Past Medical History  Diagnosis Date  . Hypertension   . Pseudotumor cerebri    Past Surgical History  Procedure Laterality Date  . Cesarean section     Family History  Problem Relation Age of Onset  . Hypertension Mother   . Diabetes Mother   . Hypertension Sister   . Hypertension Brother   . Coronary artery disease Brother    History  Substance Use Topics  . Smoking status: Never Smoker   . Smokeless tobacco: Not on file  . Alcohol Use: No   OB History    No data available     Review of Systems  Constitutional: Negative for fever and chills.  Respiratory: Positive for chest tightness. Negative for cough and shortness of breath.   Cardiovascular: Positive for chest pain. Negative for palpitations and leg swelling.  Gastrointestinal: Negative for nausea, vomiting, abdominal pain and diarrhea.  Genitourinary: Negative for dysuria, flank pain and pelvic pain.  Musculoskeletal: Negative for myalgias, arthralgias, neck pain and neck stiffness.  Skin: Negative for rash.  Neurological: Negative for dizziness, weakness and headaches.   All other systems reviewed and are negative.     Allergies  Amlodipine besy-benazepril hcl  Home Medications   Prior to Admission medications   Medication Sig Start Date End Date Taking? Authorizing Provider  acetaZOLAMIDE (DIAMOX) 125 MG tablet Take 125 mg by mouth 2 (two) times daily.   Yes Historical Provider, MD  amLODipine (NORVASC) 5 MG tablet Take 5 mg by mouth daily.   Yes Historical Provider, MD  Ascorbic Acid (VITAMIN C PO) Take 1 tablet by mouth daily.   Yes Historical Provider, MD  Cholecalciferol (VITAMIN D PO) Take 1 tablet by mouth daily.   Yes Historical Provider, MD  olmesartan (BENICAR) 20 MG tablet Take 20 mg by mouth daily.   Yes Historical Provider, MD  topiramate (TOPAMAX) 25 MG capsule Take 75 mg by mouth daily.   Yes Historical Provider, MD  ibuprofen (ADVIL,MOTRIN) 200 MG tablet Take 400 mg by mouth every 6 (six) hours as needed. For pain    Historical Provider, MD   BP 141/78 mmHg  Pulse 100  Temp(Src) 97.8 F (36.6 C) (Oral)  Resp 18  SpO2 99%  LMP 12/06/2014 (Exact Date) Physical Exam  Constitutional: She is oriented to person, place, and time. She appears well-developed and well-nourished. No distress.  HENT:  Head: Normocephalic.  Eyes: Conjunctivae are normal.  Neck: Neck supple.  Cardiovascular: Normal rate, regular rhythm and normal heart sounds.   Pulmonary/Chest: Effort normal and breath sounds normal. No respiratory distress. She has no wheezes. She has no rales. She  exhibits no tenderness.  Musculoskeletal: She exhibits no edema.  Normal left arm. Distal radial pulses intact.   Neurological: She is alert and oriented to person, place, and time.  Skin: Skin is warm and dry.  Psychiatric: She has a normal mood and affect. Her behavior is normal.  Nursing note and vitals reviewed.   ED Course  Procedures (including critical care time) Labs Review Labs Reviewed  CBC - Abnormal; Notable for the following:    Hemoglobin 11.7 (*)    All  other components within normal limits  BASIC METABOLIC PANEL  BRAIN NATRIURETIC PEPTIDE  I-STAT TROPOININ, ED    Imaging Review Dg Chest 2 View  12/21/2014   CLINICAL DATA:  Left upper chest and left arm pain for at least 2 months.  EXAM: CHEST  2 VIEW  COMPARISON:  PA and lateral chest 10/30/2012.  FINDINGS: Heart size and mediastinal contours are within normal limits. Both lungs are clear. Visualized skeletal structures are unremarkable.  IMPRESSION: Negative exam.   Electronically Signed   By: Drusilla Kannerhomas  Dalessio M.D.   On: 12/21/2014 20:47     EKG Interpretation   Date/Time:  Sunday December 21 2014 19:22:43 EST Ventricular Rate:  77 PR Interval:  136 QRS Duration: 76 QT Interval:  375 QTC Calculation: 424 R Axis:   65 Text Interpretation:  Sinus rhythm Confirmed by Fayrene FearingJAMES  MD, MARK ([id number]) on  12/21/2014 7:44:31 PM      MDM   Final diagnoses:  Chest pain, unspecified chest pain type    Pt here with chest discomfort and left arm discomfort that has been constant since yearly this morning. Exam unremarkable. Pt is overweight. Risk factors includes hypertension, family hx of heard disease. Will get labs, trop, cxr. Aspirin ordered.   9:18 PM Labs and CXR negative. Pt is heart score of 3. Still low risk. Doubt PE, not hypoxic, tachycardic, tachypnec. One trop negative, do not think she needs a delta trop, pain constant since this morning. Pt is followed by Dr. Mayford Knifeurner with Cardiology for htn. Discussed with Dr. Fayrene FearingJames. Will d/c home with close cardiology outpatient follow up.   Filed Vitals:   12/21/14 1917  BP: 141/78  Pulse: 100  Temp: 97.8 F (36.6 C)  TempSrc: Oral  Resp: 18  SpO2: 99%       Lottie Musselatyana A Darriana Deboy, PA-C 12/21/14 2348  Rolland PorterMark James, MD 12/23/14 [phone]

## 2014-12-21 NOTE — ED Notes (Signed)
Pt states she is having some discomfort in her left chest  Pt says it is not pain only some slight discomfort  Denies any other sxs to go with this  Pt states she had the same a couple weeks ago

## 2014-12-23 ENCOUNTER — Telehealth: Payer: Self-pay | Admitting: Cardiology

## 2014-12-23 NOTE — Telephone Encounter (Signed)
New Message        Pt calling stating that she was seen in the ER on Sunday and told to f/u w/ Dr. Mayford Knifeurner for a stress test. No order in Epic, please contact pt and advise.

## 2014-12-23 NOTE — Telephone Encounter (Signed)
Informed patient Dr. Mayford Knifeurner recommends an OV with a provider or NP tomorrow.  Told patient she will be called in the morning with possible appointment times.

## 2014-12-24 NOTE — Telephone Encounter (Signed)
Patient refuses appointment today. 1 year OV with Dr.Turner rescheduled to a sooner date (2/29). Patient agrees with treatment plan.

## 2014-12-25 ENCOUNTER — Ambulatory Visit: Payer: Medicaid Other | Admitting: Physician Assistant

## 2015-01-05 ENCOUNTER — Encounter: Payer: Self-pay | Admitting: Cardiology

## 2015-01-05 ENCOUNTER — Ambulatory Visit (INDEPENDENT_AMBULATORY_CARE_PROVIDER_SITE_OTHER): Payer: Medicaid Other | Admitting: Cardiology

## 2015-01-05 VITALS — BP 108/72 | HR 95 | Ht <= 58 in | Wt 260.0 lb

## 2015-01-05 DIAGNOSIS — R079 Chest pain, unspecified: Secondary | ICD-10-CM

## 2015-01-05 DIAGNOSIS — I10 Essential (primary) hypertension: Secondary | ICD-10-CM

## 2015-01-05 DIAGNOSIS — R0609 Other forms of dyspnea: Secondary | ICD-10-CM

## 2015-01-05 HISTORY — DX: Chest pain, unspecified: R07.9

## 2015-01-05 NOTE — Progress Notes (Signed)
Cardiology Office Note   Date:  01/05/2015   ID:  Linda Ranandrea D Lyster, DOB 1966-08-05, MRN [id number]  PCP:  Ihor GullyPowers, Stephanie J  Cardiologist:   Quintella ReichertURNER,Abdias Hickam R, MD   Chief Complaint  Patient presents with  . Hypertension  . Chest Pain      History of Present Illness: Linda Cunningham is a 49 y.o. female with a history of HTN who presents today for followup. She is doing well. She was seen in the ER on 12/21/2014 with complaints of chest pain that she described as a discomfort with radiation down her left arm that felt tingly.  She had had similar episodes in the days preceeding the ER visit along with SOB.  Pain had been constant for over 6 hours with normal troponin.  She was told to followup with Cardiology.  Since her ER visit she denies any dizziness, palpitations or syncope.  Her CP has improved but she is still having it when she goes up stairs and walking fast.  She also has had some DOE but only with exertion.  She has chronic LE edema that is controlled on diuretics.     Past Medical History  Diagnosis Date  . Hypertension   . Pseudotumor cerebri     Past Surgical History  Procedure Laterality Date  . Cesarean section       Current Outpatient Prescriptions  Medication Sig Dispense Refill  . acetaZOLAMIDE (DIAMOX) 125 MG tablet Take 125 mg by mouth daily.     Marland Kitchen. amLODipine (NORVASC) 5 MG tablet Take 5 mg by mouth daily.    . Ascorbic Acid (VITAMIN C PO) Take 1 tablet by mouth daily.    . Cholecalciferol (VITAMIN D PO) Take 1 tablet by mouth daily.    Marland Kitchen. ibuprofen (ADVIL,MOTRIN) 200 MG tablet Take 400 mg by mouth every 6 (six) hours as needed. For pain    . Multiple Vitamin (MULTIVITAMIN WITH MINERALS) TABS tablet Take 1 tablet by mouth daily.    Marland Kitchen. olmesartan (BENICAR) 20 MG tablet Take 20 mg by mouth daily.    . pantoprazole (PROTONIX) 40 MG tablet Take 40 mg by mouth every morning.    . ranitidine (ZANTAC) 150 MG tablet Take 150 mg by mouth at bedtime.    .  topiramate (TOPAMAX) 25 MG capsule Take 75 mg by mouth daily.     No current facility-administered medications for this visit.    Allergies:   Amlodipine besy-benazepril hcl; Amlodipine besy-benazepril hcl; Dust mite extract; and Lactase    Social History:  The patient  reports that she has never smoked. She does not have any smokeless tobacco history on file. She reports that she does not drink alcohol or use illicit drugs.   Family History:  The patient's family history includes Coronary artery disease in her brother; Diabetes in her mother; Hypertension in her brother, mother, and sister.    ROS:  Please see the history of present illness.   Otherwise, review of systems are positive for none.   All other systems are reviewed and negative.    PHYSICAL EXAM: VS:  BP 108/72 mmHg  Pulse 95  Ht 4\' 10"  (1.473 m)  Wt 260 lb (117.935 kg)  BMI 54.35 kg/m2  SpO2 96%  LMP 12/06/2014 , BMI Body mass index is 54.35 kg/(m^2). GEN: Well nourished, well developed, in no acute distress HEENT: normal Neck: no JVD, carotid bruits, or masses Cardiac: RRR; no murmurs, rubs, or gallops,no edema  Respiratory:  clear  to auscultation bilaterally, normal work of breathing GI: soft, nontender, nondistended, + BS MS: no deformity or atrophy Skin: warm and dry, no rash Neuro:  Strength and sensation are intact Psych: euthymic mood, full affect   EKG:  EKG is not ordered today.    Recent Labs: 12/21/2014: B Natriuretic Peptide 28.4; BUN 17; Creatinine 0.98; Hemoglobin 11.7*; Platelets 309; Potassium 3.9; Sodium 136    Lipid Panel No results found for: CHOL, TRIG, HDL, CHOLHDL, VLDL, LDLCALC, LDLDIRECT    Wt Readings from Last 3 Encounters:  01/05/15 260 lb (117.935 kg)  07/21/14 247 lb 9.6 oz (112.311 kg)  01/21/14 244 lb (110.678 kg)    ASSESSMENT AND PLAN:  1. HTN well controlled on current regimen. - continue amlodipine/Benicar     2.       Chest pain with recent ER  visit with normal Trop despite prolonged chest pain.   - Will get an ETT to rule out ischemia   3.       DOE ? Related to obesity.  Will check 2D echo   Current medicines are reviewed at length with the patient today.  The patient does not have concerns regarding medicines.  The following changes have been made:  no change  Labs/ tests ordered today include: ETT    Disposition:   FU with me in 6 months   Signed, Quintella Reichert, MD  01/05/2015 4:19 PM    [facility] [address] [place], [place], [place]  [address] Phone: [phone]; Fax: [phone]

## 2015-01-05 NOTE — Patient Instructions (Signed)
Your physician has requested that you have an echocardiogram. Echocardiography is a painless test that uses sound waves to create images of your heart. It provides your doctor with information about the size and shape of your heart and how well your heart's chambers and valves are working. This procedure takes approximately one hour. There are no restrictions for this procedure.  Your physician has requested that you have an exercise tolerance test. For further information please visit [email or web address]. Please also follow instruction sheet, as given.  Your physician wants you to follow-up in: 1 year with Dr. Turner. You will receive a reminder letter in the mail two months in advance. If you don't receive a letter, please call our office to schedule the follow-up appointment. 

## 2015-01-21 ENCOUNTER — Ambulatory Visit: Payer: Medicaid Other | Admitting: Cardiology

## 2015-02-09 ENCOUNTER — Ambulatory Visit (HOSPITAL_COMMUNITY): Payer: Medicaid Other | Attending: Cardiology | Admitting: Radiology

## 2015-02-09 ENCOUNTER — Ambulatory Visit (INDEPENDENT_AMBULATORY_CARE_PROVIDER_SITE_OTHER): Payer: Medicaid Other | Admitting: Physician Assistant

## 2015-02-09 DIAGNOSIS — R079 Chest pain, unspecified: Secondary | ICD-10-CM | POA: Diagnosis not present

## 2015-02-09 DIAGNOSIS — R0609 Other forms of dyspnea: Secondary | ICD-10-CM | POA: Diagnosis not present

## 2015-02-09 NOTE — Progress Notes (Signed)
Exercise Treadmill Test  Pre-Exercise Testing Evaluation Rhythm: normal sinus  Rate: 78     Test  Exercise Tolerance Test Ordering MD: Armanda Magicraci Turner, MD  Interpreting MD: Jacolyn ReedyMichele Lenze, PA-C  Unique Test No: 1  Treadmill:  1  Indication for ETT: chest pain - rule out ischemia  Contraindication to ETT: No   Stress Modality: exercise - treadmill  Cardiac Imaging Performed: non   Protocol: standard Bruce - maximal  Max BP:  154/69  Max MPHR (bpm):  174 85% MPR (bpm):  148  MPHR obtained (bpm): 176 % MPHR obtained: 101%  Reached 85% MPHR (min:sec): 1:45 Total Exercise Time (min-sec):  6:05  Workload in METS:  7.1 Borg Scale: 19  Reason ETT Terminated:  dyspnea    ST Segment Analysis At Rest: normal ST segments - no evidence of significant ST depression With Exercise: no evidence of significant ST depression  Other Information Arrhythmia:  No Angina during ETT:  absent (0) Quality of ETT:  diagnostic  ETT Interpretation:  normal - no evidence of ischemia by ST analysis  Comments: Poor exercise tolerance. Obtained 85%  MPHR at 1:45 min into exercise. No EKG changes or chest pain.  Recommendations: Regular exercise program. F/u with Dr. Mayford Knifeurner.

## 2015-02-09 NOTE — Progress Notes (Signed)
Echocardiogram performed.  

## 2015-02-09 NOTE — Progress Notes (Signed)
Please make sure patient knows that stress test was fine.  Please find out if she has had any further CP

## 2015-02-17 ENCOUNTER — Telehealth: Payer: Self-pay

## 2015-02-17 NOTE — Telephone Encounter (Signed)
Quintella Reichertraci R Turner, MD at 02/09/2015 1:35 PM     Status: Signed       Expand All Collapse All   Please make sure patient knows that stress test was fine. Please find out if she has had any further CP        Patient st she has not had any symptoms except for when she went on a hike with her son's [employer] group. She st she over-exerted herself and experienced moderate SOB and a "small chest ache" with pain <5/10.  It resolved with rest and she has not experienced it again.  To Dr. Mayford Knifeurner.

## 2015-02-17 NOTE — Telephone Encounter (Signed)
Please have her let us know if she has any further CP

## 2015-02-17 NOTE — Telephone Encounter (Signed)
Instructed patient to call if she has any further chest pain. Patient agrees with treatment plan and is grateful for callback.

## 2015-02-17 NOTE — Telephone Encounter (Signed)
-----   Message from Quintella Reichertraci R Turner, MD sent at 02/09/2015  1:36 PM EDT -----   ----- Message -----    From: Dyann KiefMichele M Lenze, PA-C    Sent: 02/09/2015  12:40 PM      To: Quintella Reichertraci R Turner, MD

## 2015-03-18 ENCOUNTER — Telehealth: Payer: Self-pay | Admitting: Cardiology

## 2015-03-18 NOTE — Telephone Encounter (Signed)
Walk in pt Form-[facility], Linda BurKaty back Thursday will give to her then

## 2015-03-20 ENCOUNTER — Encounter: Payer: Self-pay | Admitting: Cardiology

## 2015-03-20 NOTE — Telephone Encounter (Signed)
This encounter was created in error - please disregard.

## 2015-04-08 ENCOUNTER — Other Ambulatory Visit: Payer: Self-pay

## 2015-04-08 DIAGNOSIS — Z1231 Encounter for screening mammogram for malignant neoplasm of breast: Secondary | ICD-10-CM

## 2015-04-23 ENCOUNTER — Encounter (INDEPENDENT_AMBULATORY_CARE_PROVIDER_SITE_OTHER): Payer: Self-pay

## 2015-04-23 ENCOUNTER — Ambulatory Visit
Admission: RE | Admit: 2015-04-23 | Discharge: 2015-04-23 | Disposition: A | Discharge status: Home / Self Care | Payer: Medicaid Other | Source: Ambulatory Visit

## 2015-04-23 DIAGNOSIS — Z1231 Encounter for screening mammogram for malignant neoplasm of breast: Secondary | ICD-10-CM

## 2015-08-13 ENCOUNTER — Encounter (HOSPITAL_COMMUNITY): Payer: Self-pay | Admitting: Emergency Medicine

## 2015-08-13 ENCOUNTER — Emergency Department (HOSPITAL_COMMUNITY)
Admission: EM | Admit: 2015-08-13 | Discharge: 2015-08-14 | Disposition: A | Discharge status: Home / Self Care | Payer: Medicaid Other | Attending: Emergency Medicine | Admitting: Emergency Medicine

## 2015-08-13 ENCOUNTER — Emergency Department (HOSPITAL_COMMUNITY): Payer: Medicaid Other

## 2015-08-13 ENCOUNTER — Telehealth: Payer: Self-pay | Admitting: Cardiology

## 2015-08-13 DIAGNOSIS — R0602 Shortness of breath: Secondary | ICD-10-CM | POA: Diagnosis present

## 2015-08-13 DIAGNOSIS — Z8669 Personal history of other diseases of the nervous system and sense organs: Secondary | ICD-10-CM | POA: Insufficient documentation

## 2015-08-13 DIAGNOSIS — R0981 Nasal congestion: Secondary | ICD-10-CM | POA: Insufficient documentation

## 2015-08-13 DIAGNOSIS — Z79899 Other long term (current) drug therapy: Secondary | ICD-10-CM | POA: Insufficient documentation

## 2015-08-13 DIAGNOSIS — R0600 Dyspnea, unspecified: Secondary | ICD-10-CM | POA: Diagnosis not present

## 2015-08-13 DIAGNOSIS — I10 Essential (primary) hypertension: Secondary | ICD-10-CM | POA: Diagnosis not present

## 2015-08-13 LAB — URINE MICROSCOPIC-ADD ON

## 2015-08-13 LAB — I-STAT TROPONIN, ED
Troponin i, poc: 0 ng/mL (ref 0.00–0.08)
Troponin i, poc: 0 ng/mL (ref 0.00–0.08)

## 2015-08-13 LAB — URINALYSIS, ROUTINE W REFLEX MICROSCOPIC
Bilirubin Urine: NEGATIVE
Glucose, UA: NEGATIVE mg/dL
Ketones, ur: 15 mg/dL — AB
NITRITE: NEGATIVE
PROTEIN: 30 mg/dL — AB
SPECIFIC GRAVITY, URINE: 1.02 (ref 1.005–1.030)
UROBILINOGEN UA: 1 mg/dL (ref 0.0–1.0)
pH: 5.5 (ref 5.0–8.0)

## 2015-08-13 LAB — BASIC METABOLIC PANEL
Anion gap: 8 (ref 5–15)
BUN: 13 mg/dL (ref 6–20)
CALCIUM: 9.1 mg/dL (ref 8.9–10.3)
CO2: 20 mmol/L — ABNORMAL LOW (ref 22–32)
Chloride: 110 mmol/L (ref 101–111)
Creatinine, Ser: 1.09 mg/dL — ABNORMAL HIGH (ref 0.44–1.00)
GFR, EST NON AFRICAN AMERICAN: 60 mL/min — AB (ref >60)
Glucose, Bld: 99 mg/dL (ref 65–99)
Potassium: 4.4 mmol/L (ref 3.5–5.1)
Sodium: 138 mmol/L (ref 135–145)

## 2015-08-13 LAB — CBC
HCT: 39 % (ref 36.0–46.0)
HEMOGLOBIN: 12.4 g/dL (ref 12.0–15.0)
MCH: 28.6 pg (ref 26.0–34.0)
MCHC: 31.8 g/dL (ref 30.0–36.0)
MCV: 90.1 fL (ref 78.0–100.0)
Platelets: 295 10*3/uL (ref 150–400)
RBC: 4.33 MIL/uL (ref 3.87–5.11)
RDW: 13.1 % (ref 11.5–15.5)
WBC: 5 10*3/uL (ref 4.0–10.5)

## 2015-08-13 LAB — D-DIMER, QUANTITATIVE: D-Dimer, Quant: 0.44 ug/mL-FEU (ref 0.00–0.48)

## 2015-08-13 NOTE — Telephone Encounter (Signed)
New message    Pt c/o Shortness Of Breath: STAT if SOB developed within the last 24 hours or pt is noticeably SOB on the phone  1. Are you currently SOB (can you hear that pt is SOB on the phone)? No - patient states she having sob now   2. How long have you been experiencing SOB? Couple of week   3. Are you SOB when sitting or when up moving around? Sob   4. Are you currently experiencing any other symptoms? Not feeling like herself

## 2015-08-13 NOTE — Discharge Instructions (Signed)

## 2015-08-13 NOTE — ED Notes (Signed)
Pt c/o shob x couple weeks. Pt states that she went to PCP yesterday and had cxr, walked around office while monitoring her O2 level, pt states everything was good and wasn't prescribed any medications for her trouble breathing but was prescribed for something else. Pt states that she having worsening shob as she wa driving here. Pt answers in complete sentences and appears to be in NAD.

## 2015-08-13 NOTE — ED Provider Notes (Signed)
CSN: [id number]     Arrival date & time 08/13/15  4098 History   First MD Initiated Contact with Patient 08/13/15 [phone]     Chief Complaint  Patient presents with  . Shortness of Breath     (Consider location/radiation/quality/duration/timing/severity/associated sxs/prior Treatment) HPI Comments: 49 y.o. Female with history of HTN, pseudotumor presents for shortness of breath.  The patient reports that over the last few weeks she has been short of breath.  She says that nothing seems to make it better or worse.  Activity does not make it worse.  She denies fever, chills, nausea, vomiting, diarrhea. No chest pain or leg swelling.  No cough..  No recent travel, surgery, or immobility.  Patient has been seen by her PCP for this issue and has a scheduled appointment with a cardiology office tomorrow.  She is not a smoker.  She believes these symptoms started around the time that she was started on CPAP.  On further history taking she does report sinus congestion over this time.   Past Medical History  Diagnosis Date  . Hypertension   . Pseudotumor cerebri    Past Surgical History  Procedure Laterality Date  . Cesarean section     Family History  Problem Relation Age of Onset  . Hypertension Mother   . Diabetes Mother   . Hypertension Sister   . Hypertension Brother   . Coronary artery disease Brother    Social History  Substance Use Topics  . Smoking status: Never Smoker   . Smokeless tobacco: None  . Alcohol Use: No   OB History    No data available     Review of Systems  Constitutional: Negative for fever, chills, appetite change and fatigue.  HENT: Positive for congestion. Negative for ear pain, postnasal drip, rhinorrhea, tinnitus and voice change.   Respiratory: Positive for shortness of breath. Negative for cough, chest tightness and wheezing.   Cardiovascular: Negative for chest pain, palpitations and leg swelling.  Gastrointestinal: Negative for nausea, vomiting,  abdominal pain and diarrhea.  Genitourinary: Negative for dysuria, urgency and flank pain.  Musculoskeletal: Negative for myalgias and back pain.  Skin: Negative for rash.  Neurological: Negative for dizziness, weakness, light-headedness and headaches.  Hematological: Does not bruise/bleed easily.      Allergies  Amlodipine besy-benazepril hcl; Amlodipine besy-benazepril hcl; Dust mite extract; and Lactase  Home Medications   Prior to Admission medications   Medication Sig Start Date End Date Taking? Authorizing Provider  acetaZOLAMIDE (DIAMOX) 125 MG tablet Take 125 mg by mouth daily.    Yes Historical Provider, MD  amLODipine (NORVASC) 5 MG tablet Take 5 mg by mouth daily.   Yes Historical Provider, MD  Ascorbic Acid (VITAMIN C PO) Take 1 tablet by mouth daily.   Yes Historical Provider, MD  Cholecalciferol (VITAMIN D PO) Take 1 tablet by mouth daily.   Yes Historical Provider, MD  ibuprofen (ADVIL,MOTRIN) 200 MG tablet Take 400 mg by mouth every 6 (six) hours as needed. For pain   Yes Historical Provider, MD  levonorgestrel (MIRENA) 20 MCG/24HR IUD 1 each by Intrauterine route once.   Yes Historical Provider, MD  Multiple Vitamin (MULTIVITAMIN WITH MINERALS) TABS tablet Take 1 tablet by mouth daily.   Yes Historical Provider, MD  olmesartan (BENICAR) 40 MG tablet Take 40 mg by mouth daily. 04/01/15  Yes Historical Provider, MD  pantoprazole (PROTONIX) 40 MG tablet Take 40 mg by mouth every morning.   Yes Historical Provider, MD  ranitidine (ZANTAC)  150 MG tablet Take 150 mg by mouth at bedtime.   Yes Historical Provider, MD  topiramate (TOPAMAX) 25 MG capsule Take 75 mg by mouth daily.   Yes Historical Provider, MD   BP 109/74 mmHg  Pulse 81  Temp(Src) 98.4 F (36.9 C) (Oral)  Resp 20  Ht 4\' 11"  (1.499 m)  Wt 255 lb (115.667 kg)  BMI 51.48 kg/m2  SpO2 98% Physical Exam  Constitutional: She is oriented to person, place, and time. She appears well-developed and well-nourished.  No distress.  HENT:  Head: Normocephalic and atraumatic.  Right Ear: External ear normal.  Left Ear: External ear normal.  Nose: Nose normal.  Mouth/Throat: Oropharynx is clear and moist. No oropharyngeal exudate.  Eyes: EOM are normal. Pupils are equal, round, and reactive to light.  Neck: Normal range of motion. Neck supple.  Cardiovascular: Normal rate, regular rhythm, normal heart sounds and intact distal pulses.   No murmur heard. Pulmonary/Chest: Effort normal. No respiratory distress. She has no wheezes. She has no rales.  Abdominal: Soft. She exhibits no distension. There is no tenderness.  Musculoskeletal: Normal range of motion. She exhibits no edema or tenderness.  Neurological: She is alert and oriented to person, place, and time.  Skin: Skin is warm and dry. No rash noted. She is not diaphoretic.  Vitals reviewed.   ED Course  Procedures (including critical care time) Labs Review Labs Reviewed  BASIC METABOLIC PANEL - Abnormal; Notable for the following:    CO2 20 (*)    Creatinine, Ser 1.09 (*)    GFR calc non Af Amer 60 (*)    All other components within normal limits  URINALYSIS, ROUTINE W REFLEX MICROSCOPIC (NOT AT [facility]) - Abnormal; Notable for the following:    APPearance CLOUDY (*)    Hgb urine dipstick LARGE (*)    Ketones, ur 15 (*)    Protein, ur 30 (*)    Leukocytes, UA SMALL (*)    All other components within normal limits  URINE MICROSCOPIC-ADD ON - Abnormal; Notable for the following:    Squamous Epithelial / LPF MANY (*)    Bacteria, UA MANY (*)    All other components within normal limits  CBC  D-DIMER, QUANTITATIVE (NOT AT [facility])  Rosezena Sensor, ED    Imaging Review Dg Chest 2 View  08/13/2015   CLINICAL DATA:  Shortness of breath.  EXAM: CHEST  2 VIEW  COMPARISON:  December 21, 2014.  FINDINGS: The heart size and mediastinal contours are within normal limits. Both lungs are clear. No pneumothorax or pleural effusion is noted. The  visualized skeletal structures are unremarkable.  IMPRESSION: No active cardiopulmonary disease.   Electronically Signed   By: Lupita Raider, M.D.   On: 08/13/2015 10:01   I have personally reviewed and evaluated these images and lab results as part of my medical decision-making.   EKG Interpretation   Date/Time:  Thursday August 13 2015 09:37:45 EDT Ventricular Rate:  91 PR Interval:  133 QRS Duration: 80 QT Interval:  361 QTC Calculation: 444 R Axis:   14 Text Interpretation:  Sinus rhythm Low voltage, precordial leads No  significant change since last tracing Confirmed by NGUYEN, EMILY ([id number])  on 08/13/2015 10:04:31 AM      MDM  Patient was seen and evaluated in stable condition.  Normal vitals.  Patient on hormone replacement with Mirena.  Troponin and D dimer negative.  Chest xray without acute process.  12:12 PM Patient walked in  hallway without difficulty.  Normal vitals afterwards - normal pulse ox and HR.    Serial troponin unremarkable.  Patient with follow up appointment tomorrow with cardiology.  Patient was discharged home in stable condition with instruction to follow up with her PCP and to keep her cardiology appointment.  Strict return precautions given.  All questions answered prior to discharge.  Final diagnoses:  None    1. Dyspnea      Leta Baptist, MD 08/13/15 [phone]

## 2015-08-13 NOTE — Telephone Encounter (Signed)
Patient st she is "not feeling like herself lately." A few weeks ago, she got a bad cold and was really congested. She thinks this is when her breathing issues started. Then, she started her CPAP about a month ago.  She st her breathing "got a little worse" about 2 weeks ago.  Patient has difficulty describing the breathing problems she's having. She st she is NOT short of breath, but maybe she is not holding as much air as before. She is not in distress on the phone. She also st she might still be a little congested.  She saw her PCP yesterday, who cleared her and told her everything is fine. The OV from this visit has been requested. The patient insists something "is not right." Will send Christoper Allegra an order for recent download as well.  Prior to call coming to triage, patient requested an appointment and was scheduled tomorrow with Carlean Jews.

## 2015-08-14 ENCOUNTER — Ambulatory Visit (INDEPENDENT_AMBULATORY_CARE_PROVIDER_SITE_OTHER): Payer: Medicaid Other | Admitting: Physician Assistant

## 2015-08-14 ENCOUNTER — Encounter: Payer: Self-pay | Admitting: Physician Assistant

## 2015-08-14 VITALS — BP 108/60 | HR 99 | Ht 59.0 in | Wt 251.6 lb

## 2015-08-14 DIAGNOSIS — R0602 Shortness of breath: Secondary | ICD-10-CM | POA: Diagnosis not present

## 2015-08-14 NOTE — Patient Instructions (Addendum)
Medication Instructions:   Your physician recommends that you continue on your current medications as directed. Please refer to the Current Medication list given to you today.  Labwork:  NONE ORDER TODAY  Testing/Procedures:  Your physician has requested that you have en exercise stress myoview. NEXT WEEK For further information please visit [email or web address] Please follow instruction sheet, as given.  Follow-Up:  Your physician wants you to follow-up in: ONE YEAR WITH  DR Sherlyn Lick will receive a reminder letter in the mail two months in advance. If you don't receive a letter, please call our office to schedule the follow-up appointment.     Any Other Special Instructions Will Be Listed Below (If Applicable).

## 2015-08-14 NOTE — Progress Notes (Signed)
Cardiology Office Note    Date:  08/14/2015   ID:  Linda Cunningham, DOB 08-Jul-1966, MRN [id number]  PCP:  Delbert Harness, MD  Cardiologist:  Dr. Mayford Knife     History of Present Illness: Linda Cunningham is a 49 y.o. female  with a history of HTN, OSA on CPAP, obesity, pseudotumor cerebri and chest pain who was added onto my schedule today for evaluation of SOB.  She was last seen by Dr. Mayford Knife in 12/2014 after a visit to the ED on 12/21/2014 with complaints of chest pain that she described as a discomfort with radiation down her left arm that felt tingly. She had had similar episodes in the days preceeding the ER visit along with SOB. Pain had been constant for over 6 hours with normal troponin. She was told to followup with Cardiology.Dr. Mayford Knife ordered an ETT and ECHO both of which returned normal.   Seen in the ED on 07/25/15 for SOB per PCP note and CXR normal.   Seen by her PCP on 08/12/15 and then in th ED yesterday. PCP felt dyspnea due to deconditioning and body habitus as well as recent URI. No restrictive lung disease on PFT. In the ED 08/13/15 serial troponin and D dimer negative. CXR clear. Sent home to follow up today.  The patient presents for follow up with me today. The patient reports that over the last few weeks she has been short of breath. She says that nothing seems to make it better or worse. Activity does not make it worse. Sometimes it is all day and sometimes comes and go. She denies fever, chills, nausea, vomiting, diarrhea. No chest pain or leg swelling. No cough. No recent travel, surgery, or immobility. On further history taking she does report sinus congestion over this time. She does not exercise at all.   Studies:  - 2D echo (02/09/15) w/ normal LVF with mildly calcified AV leaflets  - ETT (02/09/15) Normal with no ECG changes or CP.   Recent Labs/Images:   Recent Labs  12/21/14 1926 08/13/15 1049  NA 136 138  K 3.9 4.4  BUN 17 13  CREATININE 0.98  1.09*  HGB 11.7* 12.4  BNP 28.4  --      Dg Chest 2 View  08/13/2015   CLINICAL DATA:  Shortness of breath.  EXAM: CHEST  2 VIEW  COMPARISON:  December 21, 2014.  FINDINGS: The heart size and mediastinal contours are within normal limits. Both lungs are clear. No pneumothorax or pleural effusion is noted. The visualized skeletal structures are unremarkable.  IMPRESSION: No active cardiopulmonary disease.   Electronically Signed   By: Lupita Raider, M.D.   On: 08/13/2015 10:01     Wt Readings from Last 3 Encounters:  08/13/15 255 lb (115.667 kg)  01/05/15 260 lb (117.935 kg)  07/21/14 247 lb 9.6 oz (112.311 kg)     Past Medical History  Diagnosis Date  . Hypertension   . Pseudotumor cerebri     Current Outpatient Prescriptions  Medication Sig Dispense Refill  . acetaZOLAMIDE (DIAMOX) 125 MG tablet Take 125 mg by mouth daily.     Marland Kitchen amLODipine (NORVASC) 5 MG tablet Take 5 mg by mouth daily.    . Ascorbic Acid (VITAMIN C PO) Take 1 tablet by mouth daily.    . Cholecalciferol (VITAMIN D PO) Take 1 tablet by mouth daily.    Marland Kitchen ibuprofen (ADVIL,MOTRIN) 200 MG tablet Take 400 mg by mouth every 6 (six) hours  as needed. For pain    . levonorgestrel (MIRENA) 20 MCG/24HR IUD 1 each by Intrauterine route once.    . Multiple Vitamin (MULTIVITAMIN WITH MINERALS) TABS tablet Take 1 tablet by mouth daily.    Marland Kitchen olmesartan (BENICAR) 40 MG tablet Take 40 mg by mouth daily.    . pantoprazole (PROTONIX) 40 MG tablet Take 40 mg by mouth every morning.    . ranitidine (ZANTAC) 150 MG tablet Take 150 mg by mouth at bedtime.    . topiramate (TOPAMAX) 25 MG capsule Take 75 mg by mouth daily.     No current facility-administered medications for this visit.     Allergies:   Amlodipine besy-benazepril hcl; Amlodipine besy-benazepril hcl; Dust mite extract; and Lactase   Social History:  The patient  reports that she has never smoked. She does not have any smokeless tobacco history on file. She reports  that she does not drink alcohol or use illicit drugs.   Family History:  The patient's family history includes Coronary artery disease in her brother; Diabetes in her mother; Hypertension in her brother, mother, and sister.   ROS:  Please see the history of present illness.    All other systems reviewed and negative.    PHYSICAL EXAM: VS:  There were no vitals taken for this visit. Well nourished, well developed, in no acute distress HEENT: normal Neck: no JVD Cardiac:  normal S1, S2; RRR; no murmur Lungs:  clear to auscultation bilaterally, no wheezing, rhonchi or rales Abd: soft, nontender, no hepatomegaly Ext: no edema Skin: warm and dry Neuro:  CNs 2-12 intact, no focal abnormalities noted  EKG:  None today      ASSESSMENT AND PLAN:  Linda Cunningham is a 49 y.o. female  with a history of HTN, OSA on CPAP, obesity, pseudotumor cerebri and chest pain who was added onto my schedule today for evaluation of SOB  Chest pain- today she does not complain of chest pain. More concerned with SOB. Recent negative GXT and normal 2D ECHO (01/2015). Seen in the ED last night and ruled out. Doubt ischemia but will plan for ETT nuclear study for further evaluation.   DOE ? Related to obesity.Maybe an anxiety component. 2D echo (02/09/15) w/ normal LVF with mildly calcified AV leaflets. Counseled on diet and exercise. Loosing weight will probably help her dyspnea. Stress test with nuclear imaging as above.  HTN well controlled on current regimen. 108/60 today -- Continue amlodipine/Benicar   OSA on CPAP- follow up with Dr. Catalina Lunger, her neurologist who ordered the sleep study.   Disposition:  FU with Dr. Mayford Knife in 1 year if stress test negative. Otherwise we will her come back to talk about results.    Signed, Eustace Pen, PA-C, MHS 08/14/2015 6:33 AM    [facility] [address] [place], [place], [place]  [address] Phone: [phone]; Fax: [phone]

## 2015-08-26 ENCOUNTER — Ambulatory Visit (HOSPITAL_COMMUNITY): Payer: Medicaid Other

## 2015-08-27 ENCOUNTER — Ambulatory Visit (HOSPITAL_COMMUNITY): Payer: Medicaid Other

## 2015-09-01 ENCOUNTER — Ambulatory Visit (HOSPITAL_COMMUNITY): Payer: Medicaid Other

## 2015-09-02 ENCOUNTER — Ambulatory Visit (HOSPITAL_COMMUNITY): Payer: Medicaid Other

## 2015-09-04 ENCOUNTER — Telehealth (HOSPITAL_COMMUNITY): Payer: Self-pay

## 2015-09-04 NOTE — Telephone Encounter (Signed)
Encounter complete. 

## 2015-09-09 ENCOUNTER — Ambulatory Visit (HOSPITAL_COMMUNITY)
Admission: RE | Admit: 2015-09-09 | Discharge: 2015-09-09 | Disposition: A | Discharge status: Home / Self Care | Payer: Medicaid Other | Source: Ambulatory Visit | Attending: Internal Medicine | Admitting: Internal Medicine

## 2015-09-09 DIAGNOSIS — E669 Obesity, unspecified: Secondary | ICD-10-CM | POA: Diagnosis not present

## 2015-09-09 DIAGNOSIS — Z8249 Family history of ischemic heart disease and other diseases of the circulatory system: Secondary | ICD-10-CM | POA: Diagnosis not present

## 2015-09-09 DIAGNOSIS — I10 Essential (primary) hypertension: Secondary | ICD-10-CM | POA: Insufficient documentation

## 2015-09-09 DIAGNOSIS — Z6841 Body Mass Index (BMI) 40.0 and over, adult: Secondary | ICD-10-CM | POA: Diagnosis not present

## 2015-09-09 DIAGNOSIS — G4733 Obstructive sleep apnea (adult) (pediatric): Secondary | ICD-10-CM | POA: Insufficient documentation

## 2015-09-09 DIAGNOSIS — R9439 Abnormal result of other cardiovascular function study: Secondary | ICD-10-CM | POA: Diagnosis not present

## 2015-09-09 DIAGNOSIS — R0602 Shortness of breath: Secondary | ICD-10-CM

## 2015-09-09 DIAGNOSIS — R0609 Other forms of dyspnea: Secondary | ICD-10-CM | POA: Diagnosis not present

## 2015-09-09 MED ORDER — TECHNETIUM TC 99M SESTAMIBI GENERIC - CARDIOLITE
29.0000 | Freq: Once | INTRAVENOUS | Status: AC | PRN
  Administered 2015-09-09: 29 via INTRAVENOUS

## 2015-09-09 MED ORDER — REGADENOSON 0.4 MG/5ML IV SOLN
0.4000 mg | Freq: Once | INTRAVENOUS | Status: AC
  Administered 2015-09-09: 0.4 mg via INTRAVENOUS

## 2015-09-10 ENCOUNTER — Encounter: Payer: Self-pay | Admitting: Cardiology

## 2015-09-10 ENCOUNTER — Ambulatory Visit (HOSPITAL_COMMUNITY)
Admission: RE | Admit: 2015-09-10 | Discharge: 2015-09-10 | Disposition: A | Discharge status: Home / Self Care | Payer: Medicaid Other | Source: Ambulatory Visit | Attending: Cardiovascular Disease | Admitting: Cardiovascular Disease

## 2015-09-10 LAB — MYOCARDIAL PERFUSION IMAGING
CHL CUP NUCLEAR SRS: 5
CHL CUP NUCLEAR SSS: 6
LV dias vol: 83 mL
LV sys vol: 36 mL
NUC STRESS TID: 1.15
Peak HR: 126 {beats}/min
Rest HR: 72 {beats}/min
SDS: 1

## 2015-09-10 MED ORDER — REGADENOSON 0.4 MG/5ML IV SOLN: 0.4000 mg | Freq: Once | INTRAVENOUS | Status: DC

## 2015-09-10 MED ORDER — TECHNETIUM TC 99M SESTAMIBI GENERIC - CARDIOLITE
31.2000 | Freq: Once | INTRAVENOUS | Status: AC | PRN
  Administered 2015-09-10: 31.2 via INTRAVENOUS

## 2015-09-14 NOTE — Progress Notes (Signed)
Cardiology Office Note   Date:  09/16/2015   ID:  Linda Cunningham, DOB 13-Jan-1966, MRN [id number]  PCP:  Delbert Harness, MD  Cardiologist:  Dr. Mayford Knife    Abnormal nuclear stress test.     History of Present Illness: Linda Cunningham is a 49 y.o. female with a history of HTN, OSA on CPAP, obesity, pseudotumor cerebri and chest pain who was added onto my schedule today to discuss recent abnormal nuclear perfusion study.  She was last seen by Dr. Mayford Knife in 12/2014 after a visit to the ED on 12/21/2014 with complaints of chest pain that she described as a discomfort with radiation down her left arm that felt tingly. She had had similar episodes in the days preceeding the ER visit along with SOB. Pain had been constant for over 6 hours with normal troponin. She was told to followup with Cardiology.Dr. Mayford Knife ordered an ETT and ECHO both of which returned normal.   Seen in the ED on 07/25/15 for SOB per PCP note and CXR normal.   Seen by her PCP on 08/12/15 and then in th ED 08/13/15. PCP felt dyspnea due to deconditioning and body habitus as well as recent URI. No restrictive lung disease on PFT. In the ED 08/13/15 serial troponin and D dimer negative. CXR clear. Sent home to follow up with me on 08/14/15. She continued to have DOE and I ordered an exercise nuclear stress test which returned abnormal and consistent with ischemia ( report below).   The patient presents for follow up with me today to discuss her abnormal stress test. She still has some SOB but no chest pain. Currently feeling well. No LE edema, orthopnea, PND, palpations on syncope. We talked about cardiac catheterization as next step. She has many questions and would like to think about it.   Studies: - 2D ECHO (02/09/15): w/ normal LVF with mildly calcified AV leaflets - ETT (02/09/15): Normal with no ECG changes or CP.  - ETT Myoview (09/09/15): EF 56%, No ST segment deviation during stress. Defect 1: There is a medium defect of  moderate severity present in the basal inferoseptal, basal inferior, mid inferior and apex location. This is an intermediate risk study. Findings consistent with ischemia.   Past Medical History  Diagnosis Date  . Hypertension   . Pseudotumor cerebri     Past Surgical History  Procedure Laterality Date  . Cesarean section       Current Outpatient Prescriptions  Medication Sig Dispense Refill  . acetaZOLAMIDE (DIAMOX) 125 MG tablet Take 125 mg by mouth daily.     Marland Kitchen amLODipine (NORVASC) 5 MG tablet Take 5 mg by mouth daily.    . Ascorbic Acid (VITAMIN C PO) Take 1 tablet by mouth daily. Pt do not take every day    . Cholecalciferol (VITAMIN D PO) Take 1 tablet by mouth daily. Pt do not take every day    . ibuprofen (ADVIL,MOTRIN) 200 MG tablet Take 400 mg by mouth every 6 (six) hours as needed. For pain    . olmesartan (BENICAR) 40 MG tablet Take 40 mg by mouth daily.    . ranitidine (ZANTAC) 150 MG tablet Take 150 mg by mouth at bedtime.    . topiramate (TOPAMAX) 25 MG capsule Take 75 mg by mouth daily.     No current facility-administered medications for this visit.    Allergies:   Amlodipine besy-benazepril hcl; Amlodipine besy-benazepril hcl; Dust mite extract; and Lactase  Social History:  The patient  reports that she has never smoked. She does not have any smokeless tobacco history on file. She reports that she does not drink alcohol or use illicit drugs.   Family History:  The patient's family history includes Coronary artery disease in her brother; Diabetes in her mother; Hypertension in her brother, mother, and sister.    ROS:  Please see the history of present illness.   Otherwise, review of systems are positive for NONE.   All other systems are reviewed and negative.    PHYSICAL EXAM: VS:  BP 115/78 mmHg  Pulse 96  Ht  (1.499 m)  Wt 253 lb 12.8 oz (115.123 kg)  BMI 51.23 kg/m2 , BMI Body mass index is 51.23 kg/(m^2). GEN: Well nourished, well  developed, in no acute distressobese HEENT: normal Neck: no JVD, carotid bruits, or masses Cardiac: RRR; no murmurs, rubs, or gallops,no edema  Respiratory:  clear to auscultation bilaterally, normal work of breathing GI: soft, nontender, nondistended, + BS MS: no deformity or atrophy Skin: warm and dry, no rash Neuro:  Strength and sensation are intact Psych: euthymic mood, full affect   EKG:  EKG is not ordered today.    Recent Labs: 12/21/2014: B Natriuretic Peptide 28.4 08/13/2015: BUN 13; Creatinine, Ser 1.09*; Hemoglobin 12.4; Platelets 295; Potassium 4.4; Sodium 138    Lipid Panel No results found for: CHOL, TRIG, HDL, CHOLHDL, VLDL, LDLCALC, LDLDIRECT    Wt Readings from Last 3 Encounters:  09/16/15 253 lb 12.8 oz (115.123 kg)  09/09/15 251 lb (113.853 kg)  08/14/15 251 lb 9.6 oz (114.125 kg)      Other studies Reviewed: Additional studies/ records that were reviewed today include: 2D ECHO, Ett nuclear study, GXT Review of the above records demonstrates: see HPI   ASSESSMENT AND PLAN:  Linda Cunningham is a 49 y.o. female with a history of HTN, OSA on CPAP, obesity, pseudotumor cerebri and chest pain who was added onto my schedule today to discuss recent abnormal nuclear perfusion study.  Abnormal stress- ETT Myoview (09/09/15): EF 56%, No ST segment deviation during stress. Defect 1: There is a medium defect of moderate severity present in the basal inferoseptal, basal inferior, mid inferior and apex location. This is an intermediate risk study. Findings consistent with ischemia. -- I have reviewed the risks, indications, and alternatives to cardiac catheterization and possible angioplasty/stenting with the patient. Risks include but are not limited to bleeding, infection, vascular injury, stroke, myocardial infection, arrhythmia, kidney injury, radiation-related injury in the case of prolonged fluoroscopy use, emergency cardiac surgery, and death. The patient  understands the risks of serious complication is low (<1%).  -- She is unsure she wants to proceed with cardiac catheterization. She would like to discuss with her mother and call us back to arrange. She is aware that she will need to come back in for lab work.   HTN well controlled on current regimen. 115/78 mm Hg today -- Continue amlodipine/Benicar   OSA on CPAP- follow up with Dr. Catalina Lunger, her neurologist who ordered the sleep study.   Disposition:Disposition based on cath findings.    Current medicines are reviewed at length with the patient today.  The patient does not have concerns regarding medicines.  The following changes have been made:  no change  Labs/ tests ordered today include:  No orders of the defined types were placed in this encounter.     Disposition:   FU with Dr. Mayford Knife based on decision  to proceed with cardiac cath.   Charlestine MassedSigned, Baeleigh Devincent R, PA-C  09/16/2015 8:56 AM    [facility] [address], [place], [place]  [address] Phone: [phone]; Fax: [phone]

## 2015-09-16 ENCOUNTER — Encounter: Payer: Self-pay | Admitting: Physician Assistant

## 2015-09-16 ENCOUNTER — Ambulatory Visit (INDEPENDENT_AMBULATORY_CARE_PROVIDER_SITE_OTHER): Payer: Medicaid Other | Admitting: Physician Assistant

## 2015-09-16 VITALS — BP 115/78 | HR 96 | Ht 59.0 in | Wt 253.8 lb

## 2015-09-16 DIAGNOSIS — R9439 Abnormal result of other cardiovascular function study: Secondary | ICD-10-CM | POA: Diagnosis not present

## 2015-09-16 LAB — CBC WITH DIFFERENTIAL/PLATELET
BASOS ABS: 0 10*3/uL (ref 0.0–0.1)
BASOS PCT: 1 % (ref 0–1)
Eosinophils Absolute: 0 10*3/uL (ref 0.0–0.7)
Eosinophils Relative: 1 % (ref 0–5)
HCT: 38.8 % (ref 36.0–46.0)
HEMOGLOBIN: 12.6 g/dL (ref 12.0–15.0)
LYMPHS ABS: 1.4 10*3/uL (ref 0.7–4.0)
Lymphocytes Relative: 30 % (ref 12–46)
MCH: 29.2 pg (ref 26.0–34.0)
MCHC: 32.5 g/dL (ref 30.0–36.0)
MCV: 89.8 fL (ref 78.0–100.0)
MONOS PCT: 9 % (ref 3–12)
MPV: 9.7 fL (ref 8.6–12.4)
Monocytes Absolute: 0.4 10*3/uL (ref 0.1–1.0)
NEUTROS ABS: 2.8 10*3/uL (ref 1.7–7.7)
NEUTROS PCT: 59 % (ref 43–77)
Platelets: 323 10*3/uL (ref 150–400)
RBC: 4.32 MIL/uL (ref 3.87–5.11)
RDW: 13.2 % (ref 11.5–15.5)
WBC: 4.8 10*3/uL (ref 4.0–10.5)

## 2015-09-16 LAB — BASIC METABOLIC PANEL
BUN: 14 mg/dL (ref 7–25)
CHLORIDE: 108 mmol/L (ref 98–110)
CO2: 21 mmol/L (ref 20–31)
Calcium: 8.8 mg/dL (ref 8.6–10.2)
Creat: 0.95 mg/dL (ref 0.50–1.10)
Glucose, Bld: 100 mg/dL — ABNORMAL HIGH (ref 65–99)
Potassium: 3.9 mmol/L (ref 3.5–5.3)
SODIUM: 138 mmol/L (ref 135–146)

## 2015-09-16 LAB — PROTIME-INR
INR: 0.99 (ref <1.50)
Prothrombin Time: 13.2 seconds (ref 11.6–15.2)

## 2015-09-16 LAB — APTT: APTT: 32 s (ref 24–37)

## 2015-09-16 NOTE — Addendum Note (Signed)
Addended by: Brien MatesGONZALEZ, Thersea Manfredonia R on: 09/16/2015 10:10 AM   Modules accepted: Orders

## 2015-09-16 NOTE — Patient Instructions (Addendum)
Medication Instructions:   Your physician recommends that you continue on your current medications as directed. Please refer to the Current Medication list given to you today.   Labwork: Your physician recommends that you have lab work in: bmet/cbc/pt/inr/ptt   Testing/Procedures: You are scheduled for a cardiac catheterization on Friday, November 18 with Dr. Thomasene LotJordon or associate.  [facility] Friday, November 18  At 5:30 am .  Enter thru [address] A No food or drink after midnight on Thursday, November 17. You may take your medications with a sip of water on the day of your procedure.    Follow-Up: Your physician recommends that you keep your scheduled  follow-up appointment with Dr. Mayford Knifeurner.   Any Other Special Instructions Will Be Listed Below (If Applicable).      If you need a refill on your cardiac medications before your next appointment, please call your pharmacy.  Coronary Angiogram A coronary angiogram, also called coronary angiography, is an X-ray procedure used to look at the arteries in the heart. In this procedure, a dye (contrast dye) is injected through a long, hollow tube (catheter). The catheter is about the size of a piece of cooked spaghetti and is inserted through your groin, wrist, or arm. The dye is injected into each artery, and X-rays are then taken to show if there is a blockage in the arteries of your heart. LET [facility] Franziska Podgurski KNOW ABOUT:  Any allergies you have, including allergies to shellfish or contrast dye.   All medicines you are taking, including vitamins, herbs, eye drops, creams, and over-the-counter medicines.   Previous problems you or members of your family have had with the use of anesthetics.   Any blood disorders you have.   Previous surgeries you have had.  History of kidney problems or failure.   Other medical conditions you have. RISKS AND COMPLICATIONS  Generally, a  coronary angiogram is a safe procedure. However, problems can occur and include:  Allergic reaction to the dye.  Bleeding from the access site or other locations.  Kidney injury, especially in people with impaired kidney function.  Stroke (rare).  Heart attack (rare). BEFORE THE PROCEDURE   Do not eat or drink anything after midnight the night before the procedure or as directed by your health care Ladale Sherburn.   Ask your health care Shulamis Wenberg about changing or stopping your regular medicines. This is especially important if you are taking diabetes medicines or blood thinners. PROCEDURE  You may be given a medicine to help you relax (sedative) before the procedure. This medicine is given through an intravenous (IV) access tube that is inserted into one of your veins.   The area where the catheter will be inserted will be washed and shaved. This is usually done in the groin but may be done in the fold of your arm (near your elbow) or in the wrist.   A medicine will be given to numb the area where the catheter will be inserted (local anesthetic).   The health care Selmer Adduci will insert the catheter into an artery. The catheter will be guided by using a special type of X-ray (fluoroscopy) of the blood vessel being examined.   A special dye will then be injected into the catheter, and X-rays will be taken. The dye will help to show where any narrowing or blockages are located in the heart arteries.  AFTER THE PROCEDURE   If the procedure is done  through the leg, you will be kept in bed lying flat for several hours. You will be instructed to not bend or cross your legs.  The insertion site will be checked frequently.   The pulse in your feet or wrist will be checked frequently.   Additional blood tests, X-rays, and an electrocardiogram may be done.    This information is not intended to replace advice given to you by your health care Ryett Hamman. Make sure you discuss any questions  you have with your health care Lesa Vandall.   Document Released: 04/30/2003 Document Revised: 11/14/2014 Document Reviewed: 03/18/2013 Elsevier Interactive Patient Education [employer]

## 2015-09-21 ENCOUNTER — Telehealth: Payer: Self-pay | Admitting: Cardiology

## 2015-09-21 NOTE — Telephone Encounter (Signed)
Informed patient of results and verbal understanding expressed.   Patient has questions regarding how long cath will last to give her ride a time to pick her up. Informed patient that since her procedure is first case, she will be there at least until lunch time recovering.  Patient understands that if they have to place a stent or if anything does not go as planned she will have to stay longer. Patient grateful for callback.

## 2015-09-21 NOTE — Telephone Encounter (Signed)
New problem    Pt want to know results of her labs she had done 11.8.16. Pt also as questions concerning upcoming procedure. Please advise pt.

## 2015-09-25 ENCOUNTER — Encounter (HOSPITAL_COMMUNITY)
Admission: RE | Disposition: A | Discharge status: Home / Self Care | Payer: Self-pay | Source: Ambulatory Visit | Attending: Cardiology

## 2015-09-25 ENCOUNTER — Ambulatory Visit (HOSPITAL_COMMUNITY)
Admission: RE | Admit: 2015-09-25 | Discharge: 2015-09-25 | Disposition: A | Discharge status: Home / Self Care | Payer: Medicaid Other | Source: Ambulatory Visit | Attending: Cardiology | Admitting: Cardiology

## 2015-09-25 ENCOUNTER — Encounter (HOSPITAL_COMMUNITY): Payer: Self-pay | Admitting: Cardiology

## 2015-09-25 DIAGNOSIS — Z8249 Family history of ischemic heart disease and other diseases of the circulatory system: Secondary | ICD-10-CM | POA: Diagnosis not present

## 2015-09-25 DIAGNOSIS — R079 Chest pain, unspecified: Secondary | ICD-10-CM | POA: Diagnosis present

## 2015-09-25 DIAGNOSIS — R9439 Abnormal result of other cardiovascular function study: Secondary | ICD-10-CM

## 2015-09-25 DIAGNOSIS — G932 Benign intracranial hypertension: Secondary | ICD-10-CM | POA: Diagnosis not present

## 2015-09-25 DIAGNOSIS — G4733 Obstructive sleep apnea (adult) (pediatric): Secondary | ICD-10-CM | POA: Diagnosis not present

## 2015-09-25 DIAGNOSIS — E669 Obesity, unspecified: Secondary | ICD-10-CM | POA: Diagnosis not present

## 2015-09-25 DIAGNOSIS — Z6841 Body Mass Index (BMI) 40.0 and over, adult: Secondary | ICD-10-CM | POA: Insufficient documentation

## 2015-09-25 DIAGNOSIS — R072 Precordial pain: Secondary | ICD-10-CM | POA: Diagnosis not present

## 2015-09-25 DIAGNOSIS — R931 Abnormal findings on diagnostic imaging of heart and coronary circulation: Secondary | ICD-10-CM | POA: Diagnosis not present

## 2015-09-25 DIAGNOSIS — I10 Essential (primary) hypertension: Secondary | ICD-10-CM | POA: Diagnosis present

## 2015-09-25 HISTORY — PX: CARDIAC CATHETERIZATION: SHX172

## 2015-09-25 HISTORY — DX: Abnormal result of other cardiovascular function study: R94.39

## 2015-09-25 LAB — PREGNANCY, URINE: PREG TEST UR: NEGATIVE

## 2015-09-25 SURGERY — LEFT HEART CATH AND CORONARY ANGIOGRAPHY

## 2015-09-25 MED ORDER — VERAPAMIL HCL 2.5 MG/ML IV SOLN
INTRAVENOUS | Status: AC
  Filled 2015-09-25: qty 4

## 2015-09-25 MED ORDER — SODIUM CHLORIDE 0.9 % IV SOLN: 250.0000 mL | INTRAVENOUS | Status: DC | PRN

## 2015-09-25 MED ORDER — HEPARIN SODIUM (PORCINE) 1000 UNIT/ML IJ SOLN
INTRAMUSCULAR | Status: DC | PRN
  Administered 2015-09-25: 5000 [IU] via INTRAVENOUS

## 2015-09-25 MED ORDER — ASPIRIN 81 MG PO CHEW
81.0000 mg | CHEWABLE_TABLET | ORAL | Status: AC
  Administered 2015-09-25: 81 mg via ORAL

## 2015-09-25 MED ORDER — LIDOCAINE HCL (PF) 1 % IJ SOLN
INTRAMUSCULAR | Status: AC
  Filled 2015-09-25: qty 30

## 2015-09-25 MED ORDER — SODIUM CHLORIDE 0.9 % WEIGHT BASED INFUSION
3.0000 mL/kg/h | INTRAVENOUS | Status: AC
  Administered 2015-09-25: 3 mL/kg/h via INTRAVENOUS

## 2015-09-25 MED ORDER — MIDAZOLAM HCL 2 MG/2ML IJ SOLN
INTRAMUSCULAR | Status: AC
  Filled 2015-09-25: qty 2

## 2015-09-25 MED ORDER — VERAPAMIL HCL 2.5 MG/ML IV SOLN
INTRAVENOUS | Status: DC | PRN
  Administered 2015-09-25: 08:00:00 via INTRA_ARTERIAL

## 2015-09-25 MED ORDER — NITROGLYCERIN 1 MG/10 ML FOR IR/CATH LAB
INTRA_ARTERIAL | Status: DC | PRN
  Administered 2015-09-25: 08:00:00

## 2015-09-25 MED ORDER — SODIUM CHLORIDE 0.9 % IJ SOLN: 3.0000 mL | INTRAMUSCULAR | Status: DC | PRN

## 2015-09-25 MED ORDER — SODIUM CHLORIDE 0.9 % WEIGHT BASED INFUSION: 3.0000 mL/kg/h | INTRAVENOUS | Status: AC

## 2015-09-25 MED ORDER — SODIUM CHLORIDE 0.9 % IJ SOLN: 3.0000 mL | Freq: Two times a day (BID) | INTRAMUSCULAR | Status: DC

## 2015-09-25 MED ORDER — SODIUM CHLORIDE 0.9 % WEIGHT BASED INFUSION: 1.0000 mL/kg/h | INTRAVENOUS | Status: DC

## 2015-09-25 MED ORDER — FENTANYL CITRATE (PF) 100 MCG/2ML IJ SOLN
INTRAMUSCULAR | Status: AC
Start: 2015-09-25 — End: 2015-09-25
  Filled 2015-09-25: qty 2

## 2015-09-25 MED ORDER — LIDOCAINE HCL (PF) 1 % IJ SOLN
INTRAMUSCULAR | Status: DC | PRN
  Administered 2015-09-25: 3 mL

## 2015-09-25 MED ORDER — FENTANYL CITRATE (PF) 100 MCG/2ML IJ SOLN
INTRAMUSCULAR | Status: DC | PRN
  Administered 2015-09-25: 25 ug via INTRAVENOUS

## 2015-09-25 MED ORDER — IOHEXOL 350 MG/ML SOLN
INTRAVENOUS | Status: DC | PRN
  Administered 2015-09-25: 100 mL via INTRAVENOUS

## 2015-09-25 MED ORDER — ASPIRIN 81 MG PO CHEW
CHEWABLE_TABLET | ORAL | Status: AC
  Administered 2015-09-25: 81 mg via ORAL
  Filled 2015-09-25: qty 1

## 2015-09-25 MED ORDER — HEPARIN (PORCINE) IN NACL 2-0.9 UNIT/ML-% IJ SOLN
INTRAMUSCULAR | Status: AC
  Filled 2015-09-25: qty 1000

## 2015-09-25 MED ORDER — NITROGLYCERIN 1 MG/10 ML FOR IR/CATH LAB
INTRA_ARTERIAL | Status: AC
Start: 2015-09-25 — End: 2015-09-25
  Filled 2015-09-25: qty 10

## 2015-09-25 MED ORDER — MIDAZOLAM HCL 2 MG/2ML IJ SOLN
INTRAMUSCULAR | Status: DC | PRN
  Administered 2015-09-25: 2 mg via INTRAVENOUS

## 2015-09-25 MED ORDER — HEPARIN SODIUM (PORCINE) 1000 UNIT/ML IJ SOLN
INTRAMUSCULAR | Status: AC
  Filled 2015-09-25: qty 1

## 2015-09-25 SURGICAL SUPPLY — 12 items
CATH INFINITI 5 FR JL3.5 (CATHETERS) ×3 IMPLANT
CATH INFINITI 5FR ANG PIGTAIL (CATHETERS) ×3 IMPLANT
CATH INFINITI JR4 5F (CATHETERS) ×3 IMPLANT
DEVICE RAD COMP TR BAND LRG (VASCULAR PRODUCTS) ×3 IMPLANT
GLIDESHEATH SLEND SS 6F .021 (SHEATH) ×3 IMPLANT
HOVERMATT SINGLE USE (MISCELLANEOUS) ×3 IMPLANT
KIT HEART LEFT (KITS) ×3 IMPLANT
PACK CARDIAC CATHETERIZATION (CUSTOM PROCEDURE TRAY) ×3 IMPLANT
SYR MEDRAD MARK V 150ML (SYRINGE) ×3 IMPLANT
TRANSDUCER W/STOPCOCK (MISCELLANEOUS) ×3 IMPLANT
TUBING CIL FLEX 10 FLL-RA (TUBING) ×3 IMPLANT
WIRE SAFE-T 1.5MM-J .035X260CM (WIRE) ×3 IMPLANT

## 2015-09-25 NOTE — H&P (View-Only) (Signed)
Cardiology Office Note   Date:  09/16/2015   ID:  Linda Cunningham, DOB February 02, 1966, MRN [id number]  PCP:  Delbert Harness, MD  Cardiologist:  Dr. Mayford Knife    Abnormal nuclear stress test.     History of Present Illness: Linda Cunningham is a 49 y.o. female with a history of HTN, OSA on CPAP, obesity, pseudotumor cerebri and chest pain who was added onto my schedule today to discuss recent abnormal nuclear perfusion study.  She was last seen by Dr. Mayford Knife in 12/2014 after a visit to the ED on 12/21/2014 with complaints of chest pain that she described as a discomfort with radiation down her left arm that felt tingly. She had had similar episodes in the days preceeding the ER visit along with SOB. Pain had been constant for over 6 hours with normal troponin. She was told to followup with Cardiology.Dr. Mayford Knife ordered an ETT and ECHO both of which returned normal.   Seen in the ED on 07/25/15 for SOB per PCP note and CXR normal.   Seen by her PCP on 08/12/15 and then in th ED 08/13/15. PCP felt dyspnea due to deconditioning and body habitus as well as recent URI. No restrictive lung disease on PFT. In the ED 08/13/15 serial troponin and D dimer negative. CXR clear. Sent home to follow up with me on 08/14/15. She continued to have DOE and I ordered an exercise nuclear stress test which returned abnormal and consistent with ischemia ( report below).   The patient presents for follow up with me today to discuss her abnormal stress test. She still has some SOB but no chest pain. Currently feeling well. No LE edema, orthopnea, PND, palpations on syncope. We talked about cardiac catheterization as next step. She has many questions and would like to think about it.   Studies: - 2D ECHO (02/09/15): w/ normal LVF with mildly calcified AV leaflets - ETT (02/09/15): Normal with no ECG changes or CP.  - ETT Myoview (09/09/15): EF 56%, No ST segment deviation during stress. Defect 1: There is a medium defect of  moderate severity present in the basal inferoseptal, basal inferior, mid inferior and apex location. This is an intermediate risk study. Findings consistent with ischemia.   Past Medical History  Diagnosis Date  . Hypertension   . Pseudotumor cerebri     Past Surgical History  Procedure Laterality Date  . Cesarean section       Current Outpatient Prescriptions  Medication Sig Dispense Refill  . acetaZOLAMIDE (DIAMOX) 125 MG tablet Take 125 mg by mouth daily.     Marland Kitchen amLODipine (NORVASC) 5 MG tablet Take 5 mg by mouth daily.    . Ascorbic Acid (VITAMIN C PO) Take 1 tablet by mouth daily. Pt do not take every day    . Cholecalciferol (VITAMIN D PO) Take 1 tablet by mouth daily. Pt do not take every day    . ibuprofen (ADVIL,MOTRIN) 200 MG tablet Take 400 mg by mouth every 6 (six) hours as needed. For pain    . olmesartan (BENICAR) 40 MG tablet Take 40 mg by mouth daily.    . ranitidine (ZANTAC) 150 MG tablet Take 150 mg by mouth at bedtime.    . topiramate (TOPAMAX) 25 MG capsule Take 75 mg by mouth daily.     No current facility-administered medications for this visit.    Allergies:   Amlodipine besy-benazepril hcl; Amlodipine besy-benazepril hcl; Dust mite extract; and Lactase  Social History:  The patient  reports that she has never smoked. She does not have any smokeless tobacco history on file. She reports that she does not drink alcohol or use illicit drugs.   Family History:  The patient's family history includes Coronary artery disease in her brother; Diabetes in her mother; Hypertension in her brother, mother, and sister.    ROS:  Please see the history of present illness.   Otherwise, review of systems are positive for NONE.   All other systems are reviewed and negative.    PHYSICAL EXAM: VS:  BP 115/78 mmHg  Pulse 96  Ht 4' 11" (1.499 m)  Wt 253 lb 12.8 oz (115.123 kg)  BMI 51.23 kg/m2 , BMI Body mass index is 51.23 kg/(m^2). GEN: Well nourished, well  developed, in no acute distressobese HEENT: normal Neck: no JVD, carotid bruits, or masses Cardiac: RRR; no murmurs, rubs, or gallops,no edema  Respiratory:  clear to auscultation bilaterally, normal work of breathing GI: soft, nontender, nondistended, + BS MS: no deformity or atrophy Skin: warm and dry, no rash Neuro:  Strength and sensation are intact Psych: euthymic mood, full affect   EKG:  EKG is not ordered today.    Recent Labs: 12/21/2014: B Natriuretic Peptide 28.4 08/13/2015: BUN 13; Creatinine, Ser 1.09*; Hemoglobin 12.4; Platelets 295; Potassium 4.4; Sodium 138    Lipid Panel No results found for: CHOL, TRIG, HDL, CHOLHDL, VLDL, LDLCALC, LDLDIRECT    Wt Readings from Last 3 Encounters:  09/16/15 253 lb 12.8 oz (115.123 kg)  09/09/15 251 lb (113.853 kg)  08/14/15 251 lb 9.6 oz (114.125 kg)      Other studies Reviewed: Additional studies/ records that were reviewed today include: 2D ECHO, Ett nuclear study, GXT Review of the above records demonstrates: see HPI   ASSESSMENT AND PLAN:  Linda Cunningham is a 48 y.o. female with a history of HTN, OSA on CPAP, obesity, pseudotumor cerebri and chest pain who was added onto my schedule today to discuss recent abnormal nuclear perfusion study.  Abnormal stress- ETT Myoview (09/09/15): EF 56%, No ST segment deviation during stress. Defect 1: There is a medium defect of moderate severity present in the basal inferoseptal, basal inferior, mid inferior and apex location. This is an intermediate risk study. Findings consistent with ischemia. -- I have reviewed the risks, indications, and alternatives to cardiac catheterization and possible angioplasty/stenting with the patient. Risks include but are not limited to bleeding, infection, vascular injury, stroke, myocardial infection, arrhythmia, kidney injury, radiation-related injury in the case of prolonged fluoroscopy use, emergency cardiac surgery, and death. The patient  understands the risks of serious complication is low (<1%).  -- She is unsure she wants to proceed with cardiac catheterization. She would like to discuss with her mother and call us back to arrange. She is aware that she will need to come back in for lab work.   HTN well controlled on current regimen. 115/78 mm Hg today -- Continue amlodipine/Benicar   OSA on CPAP- follow up with Dr. Hagan, her neurologist who ordered the sleep study.   Disposition:Disposition based on cath findings.    Current medicines are reviewed at length with the patient today.  The patient does not have concerns regarding medicines.  The following changes have been made:  no change  Labs/ tests ordered today include:  No orders of the defined types were placed in this encounter.     Disposition:   FU with Dr. Turner based on decision   to proceed with cardiac cath.   Charlestine MassedSigned, Brindley Madarang R, PA-C  09/16/2015 8:56 AM    [facility] [address], [place], [place]  [address] Phone: [phone]; Fax: [phone]

## 2015-09-25 NOTE — Interval H&P Note (Signed)
History and Physical Interval Note:  09/25/2015 7:25 AM  Linda RanaAndrea D Cunningham  has presented today for surgery, with the diagnosis of abnormal stress  The various methods of treatment have been discussed with the patient and family. After consideration of risks, benefits and other options for treatment, the patient has consented to  Procedure(s): Left Heart Cath and Coronary Angiography (N/A) as a surgical intervention .  The patient's history has been reviewed, patient examined, no change in status, stable for surgery.  I have reviewed the patient's chart and labs.  Questions were answered to the patient's satisfaction.   Cath Lab Visit (complete for each Cath Lab visit)  Clinical Evaluation Leading to the Procedure:   ACS: No.  Non-ACS:    Anginal Classification: CCS III  Anti-ischemic medical therapy: Minimal Therapy (1 class of medications)  Non-Invasive Test Results: Intermediate-risk stress test findings: cardiac mortality 1-3%/year  Prior CABG: No previous CABG        Theron AristaPeter Northeast Rehabilitation HospitalJordanMD,FACC 09/25/2015 7:25 AM

## 2015-09-25 NOTE — Discharge Instructions (Signed)
Radial Site Care °Refer to this sheet in the next few weeks. These instructions provide you with information about caring for yourself after your procedure. Your health care provider may also give you more specific instructions. Your treatment has been planned according to current medical practices, but problems sometimes occur. Call your health care provider if you have any problems or questions after your procedure. °WHAT TO EXPECT AFTER THE PROCEDURE °After your procedure, it is typical to have the following: °· Bruising at the radial site that usually fades within 1-2 weeks. °· Blood collecting in the tissue (hematoma) that may be painful to the touch. It should usually decrease in size and tenderness within 1-2 weeks. °HOME CARE INSTRUCTIONS °· Take medicines only as directed by your health care provider. °· You may shower 24-48 hours after the procedure or as directed by your health care provider. Remove the bandage (dressing) and gently wash the site with plain soap and water. Pat the area dry with a clean towel. Do not rub the site, because this may cause bleeding. °· Do not take baths, swim, or use a hot tub until your health care provider approves. °· Check your insertion site every day for redness, swelling, or drainage. °· Do not apply powder or lotion to the site. °· Do not flex or bend the affected arm for 24 hours or as directed by your health care provider. °· Do not push or pull heavy objects with the affected arm for 24 hours or as directed by your health care provider. °· Do not lift over 10 lb (4.5 kg) for 5 days after your procedure or as directed by your health care provider. °· Ask your health care provider when it is okay to: °¨ Return to work or school. °¨ Resume usual physical activities or sports. °¨ Resume sexual activity. °· Do not drive home if you are discharged the same day as the procedure. Have someone else drive you. °· You may drive 24 hours after the procedure unless otherwise  instructed by your health care provider. °· Do not operate machinery or power tools for 24 hours after the procedure. °· If your procedure was done as an outpatient procedure, which means that you went home the same day as your procedure, a responsible adult should be with you for the first 24 hours after you arrive home. °· Keep all follow-up visits as directed by your health care provider. This is important. °SEEK MEDICAL CARE IF: °· You have a fever. °· You have chills. °· You have increased bleeding from the radial site. Hold pressure on the site. °SEEK IMMEDIATE MEDICAL CARE IF: °· You have unusual pain at the radial site. °· You have redness, warmth, or swelling at the radial site. °· You have drainage (other than a small amount of blood on the dressing) from the radial site. °· The radial site is bleeding, and the bleeding does not stop after 30 minutes of holding steady pressure on the site. °· Your arm or hand becomes pale, cool, tingly, or numb. °  °This information is not intended to replace advice given to you by your health care provider. Make sure you discuss any questions you have with your health care provider. °  °Document Released: 11/26/2010 Document Revised: 11/14/2014 Document Reviewed: 05/12/2014 °Elsevier Interactive Patient Education ©2016 [employer] ° °

## 2015-09-28 ENCOUNTER — Telehealth: Payer: Self-pay

## 2015-09-28 NOTE — Telephone Encounter (Signed)
F/u with PCP.  

## 2015-09-28 NOTE — Telephone Encounter (Signed)
Normal cath. Patient does not need followup with me in January. SHe can followup with PCP for her HTN         Traci    ----- Message -----     From: Janetta HoraKathryn R Thompson, PA-C     Sent: 09/25/2015 10:12 AM      To: Quintella Reichertraci R Turner, MD        Normal cath. I don't think she requires long term cardiology follow up. She has an office visit with you in Jan.            ----- Message -----     From: Peter M SwazilandJordan, MD     Sent: 09/25/2015  8:14 AM      To: Quintella Reichertraci R Turner, MD, Janetta HoraKathryn R Thompson, PA-C         Informed patient of results and that January OV will be cancelled. Patient still concerned because she is having SOB "when she talks a lot." She wants further recommendations from Dr. Mayford Knifeurner before cancelling appointment.  To Dr. Mayford Knifeurner.

## 2015-09-29 NOTE — Telephone Encounter (Signed)
Instructed patient to follow-up with her PCP for SOB. Patient agrees with treatment plan.

## 2015-10-05 ENCOUNTER — Telehealth: Payer: Self-pay | Admitting: Cardiology

## 2015-10-05 NOTE — Telephone Encounter (Signed)
Per Dr. Mayford Knifeurner, informed patient January appointment is cancelled. (see 11/21 phone encounter)

## 2015-10-05 NOTE — Telephone Encounter (Signed)
New Message     Pt calling wanting to know if she needs to come in for her January appt w/ Dr. Mayford Knifeurner. Please call back and advise.

## 2015-10-20 ENCOUNTER — Ambulatory Visit: Payer: Medicaid Other | Admitting: Cardiology

## 2015-11-04 NOTE — Progress Notes (Unsigned)
Glucose: Non Fasting : 102 taken by Marcelline DeistSandra RakestrawRN

## 2015-11-18 ENCOUNTER — Ambulatory Visit: Payer: Medicaid Other | Admitting: Cardiology

## 2016-02-12 ENCOUNTER — Other Ambulatory Visit: Payer: Self-pay | Admitting: Family Medicine

## 2016-02-12 DIAGNOSIS — R5381 Other malaise: Secondary | ICD-10-CM

## 2016-03-03 ENCOUNTER — Other Ambulatory Visit: Payer: Self-pay | Admitting: Family Medicine

## 2016-03-03 DIAGNOSIS — N644 Mastodynia: Secondary | ICD-10-CM

## 2016-03-03 DIAGNOSIS — R5381 Other malaise: Secondary | ICD-10-CM

## 2016-03-09 ENCOUNTER — Ambulatory Visit
Admission: RE | Admit: 2016-03-09 | Discharge: 2016-03-09 | Disposition: A | Discharge status: Home / Self Care | Payer: Medicaid Other | Source: Ambulatory Visit | Attending: Family Medicine | Admitting: Family Medicine

## 2016-03-09 DIAGNOSIS — R5381 Other malaise: Secondary | ICD-10-CM

## 2016-03-09 DIAGNOSIS — N644 Mastodynia: Secondary | ICD-10-CM

## 2016-04-21 ENCOUNTER — Other Ambulatory Visit: Payer: Self-pay | Admitting: Family Medicine

## 2016-04-21 DIAGNOSIS — Z1231 Encounter for screening mammogram for malignant neoplasm of breast: Secondary | ICD-10-CM

## 2016-05-04 ENCOUNTER — Ambulatory Visit
Admission: RE | Admit: 2016-05-04 | Discharge: 2016-05-04 | Disposition: A | Discharge status: Home / Self Care | Payer: Medicaid Other | Source: Ambulatory Visit | Attending: Family Medicine | Admitting: Family Medicine

## 2016-05-04 DIAGNOSIS — Z1231 Encounter for screening mammogram for malignant neoplasm of breast: Secondary | ICD-10-CM

## 2016-05-09 IMAGING — CR DG CHEST 2V
2 series · 2 of 2 positions shown · non-contrast
Comparison: December 21, 2014.

CLINICAL DATA: Shortness of breath.

EXAM:
CHEST  2 VIEW

[w chest pa]
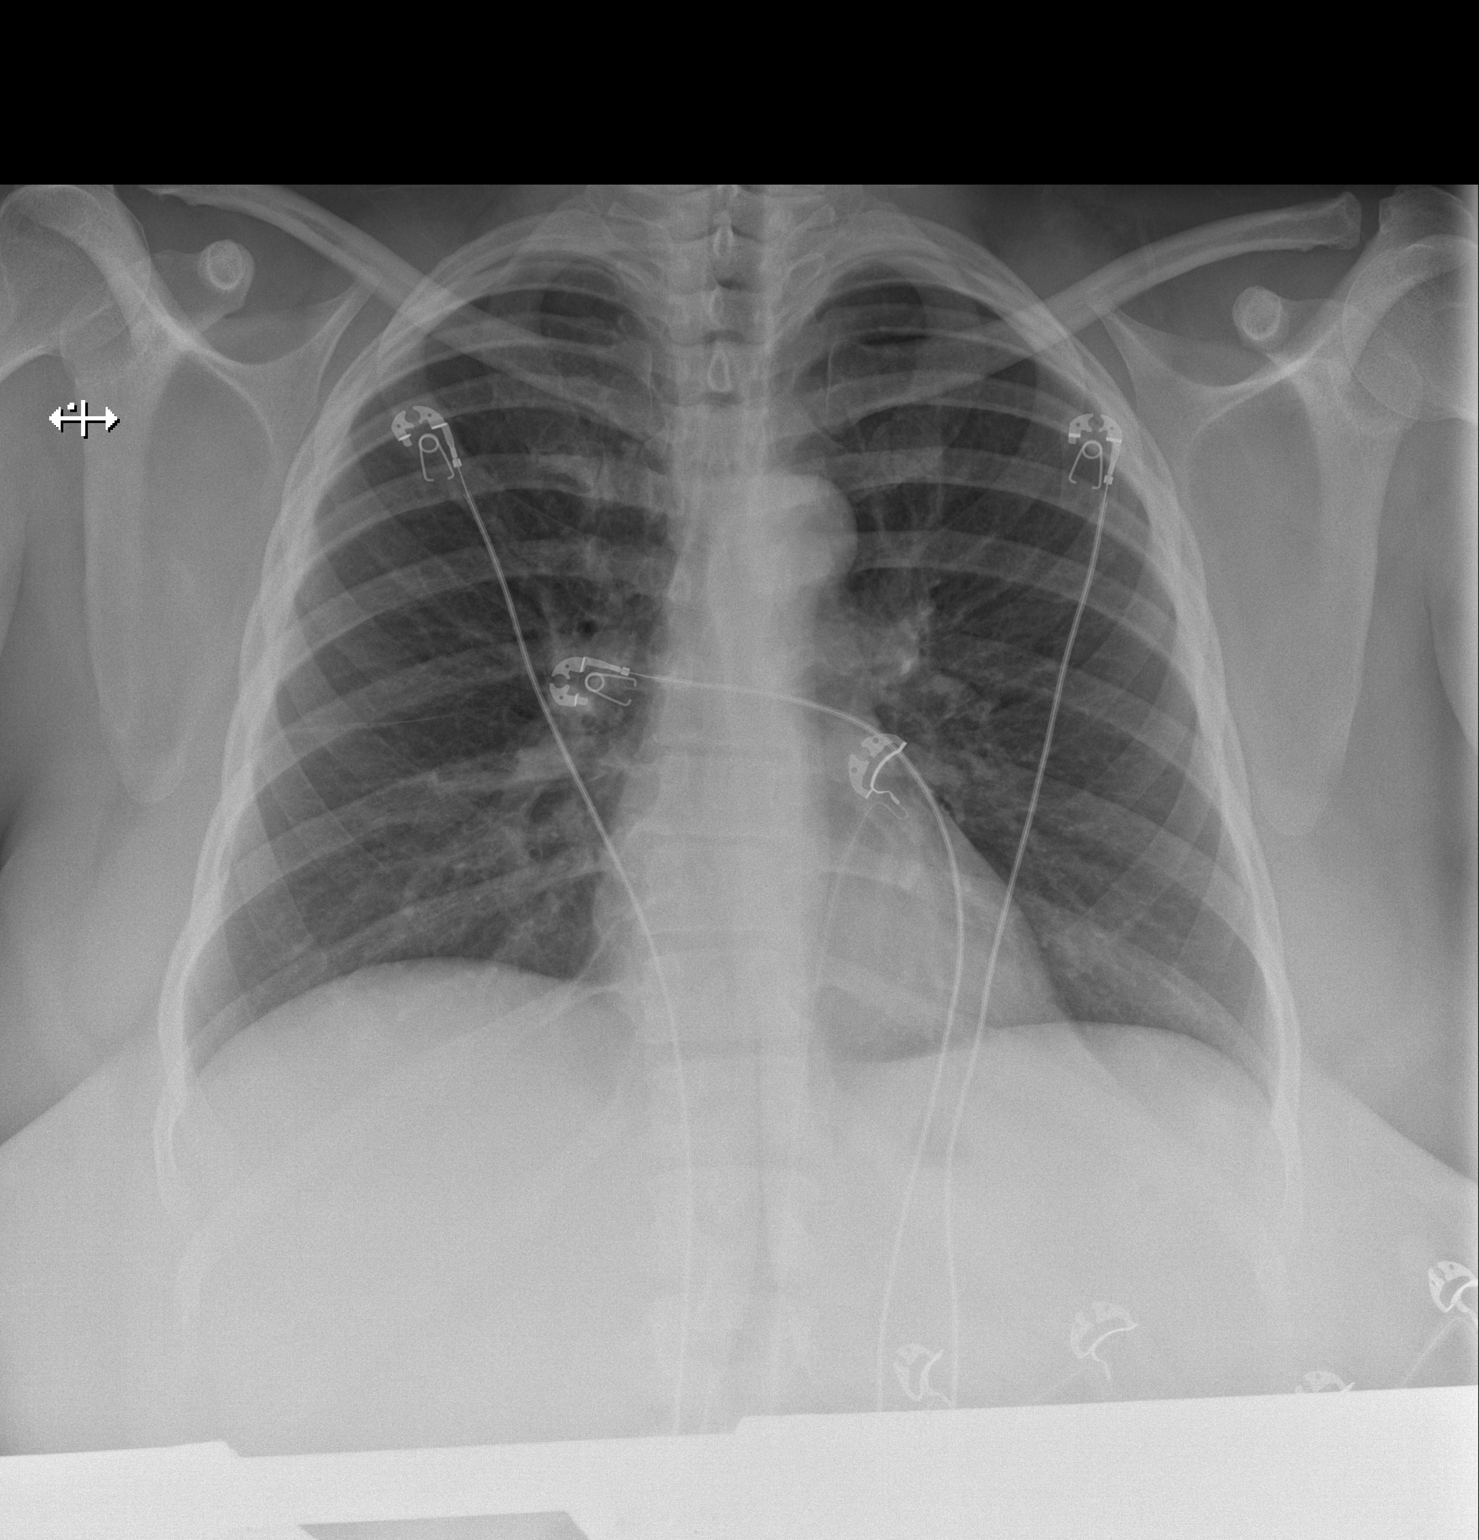

[w chest lat]
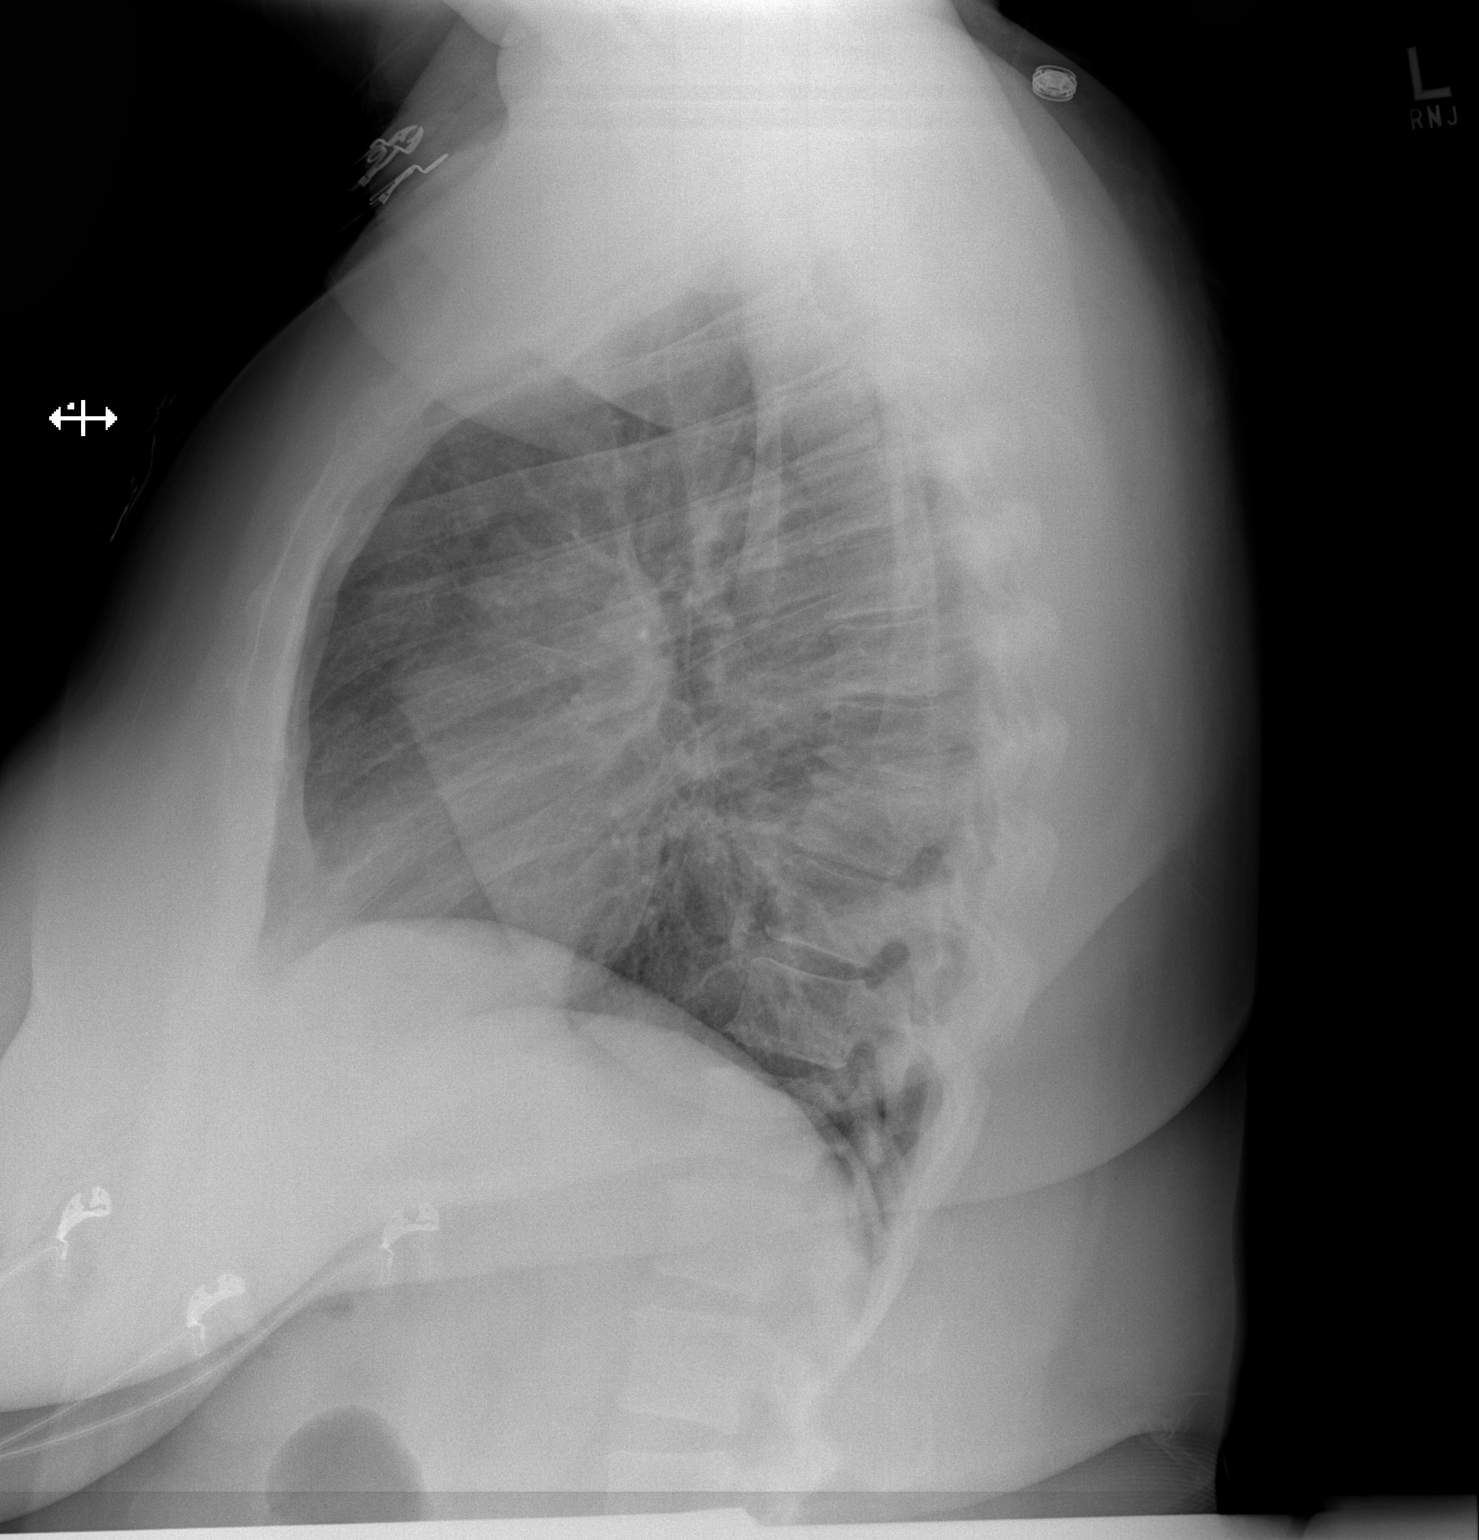

[2 of 2 positions shown; findings below may reference images not displayed]

FINDINGS: The heart size and mediastinal contours are within normal limits.
Both lungs are clear. No pneumothorax or pleural effusion is noted.
The visualized skeletal structures are unremarkable.
IMPRESSION: No active cardiopulmonary disease.

## 2016-09-16 ENCOUNTER — Encounter: Payer: Self-pay | Admitting: Cardiology

## 2016-09-16 ENCOUNTER — Ambulatory Visit (INDEPENDENT_AMBULATORY_CARE_PROVIDER_SITE_OTHER): Payer: Medicaid Other | Admitting: Cardiology

## 2016-09-16 VITALS — BP 138/84 | HR 81 | Ht 59.0 in | Wt 243.8 lb

## 2016-09-16 DIAGNOSIS — R079 Chest pain, unspecified: Secondary | ICD-10-CM

## 2016-09-16 DIAGNOSIS — R0602 Shortness of breath: Secondary | ICD-10-CM

## 2016-09-16 DIAGNOSIS — I10 Essential (primary) hypertension: Secondary | ICD-10-CM | POA: Diagnosis not present

## 2016-09-16 DIAGNOSIS — Z9989 Dependence on other enabling machines and devices: Secondary | ICD-10-CM

## 2016-09-16 DIAGNOSIS — G4733 Obstructive sleep apnea (adult) (pediatric): Secondary | ICD-10-CM | POA: Insufficient documentation

## 2016-09-16 HISTORY — DX: Shortness of breath: R06.02

## 2016-09-16 NOTE — Progress Notes (Signed)
Cardiology Office Note    Date:  09/16/2016   ID:  Linda Ranandrea D Macht, DOB [birth year], MRN [id number]  PCP:  Delbert HarnessBRISCOE, KIM, MD  Cardiologist:  Armanda Magicraci Turner, MD   No chief complaint on file.   History of Present Illness:  Linda Cunningham is a 50 y.o. female with a history of HTN and CP in 2016 with normal ETT and normal coronary arteries by cath 09/2015 (LVEDP 15mmHg) and SOB with normal 2D echo who presents today for followup.  She has lost 10lbs since I saw her a year ago.  She is doing well. She denies any anginal chest pain.  Occasionally she will have some discomfort in her epigastrium related to belching and has significant GERD.  She has chronic DOE that has not changed any.  This is likely from morbid obesity and deconditioning from not exercising.  She denies any LE edema, dizziness or syncope.      Past Medical History:  Diagnosis Date  . Hypertension   . OSA on CPAP    [facility]  . Pseudotumor cerebri   . SOB (shortness of breath) 09/16/2016    Past Surgical History:  Procedure Laterality Date  . CARDIAC CATHETERIZATION N/A 09/25/2015   Procedure: Left Heart Cath and Coronary Angiography;  Surgeon: Peter M SwazilandJordan, MD;  [facility];  Service: Cardiovascular;  Laterality: N/A;  . CESAREAN SECTION      Current Medications: Outpatient Medications Prior to Visit  Medication Sig Dispense Refill  . amLODipine (NORVASC) 5 MG tablet Take 5 mg by mouth daily.    . Ascorbic Acid (VITAMIN C PO) Take 1 tablet by mouth daily. Pt do not take every day    . Cholecalciferol (VITAMIN D PO) Take 1 tablet by mouth daily. Pt do not take every day    . ranitidine (ZANTAC) 150 MG tablet Take 150 mg by mouth at bedtime.    . topiramate (TOPAMAX) 25 MG capsule Take 75 mg by mouth daily.    Marland Kitchen. acetaZOLAMIDE (DIAMOX) 125 MG tablet Take 125 mg by mouth daily.     Marland Kitchen. ibuprofen (ADVIL,MOTRIN) 200 MG tablet Take 400 mg by mouth every 6 (six) hours as  needed. For pain    . olmesartan (BENICAR) 40 MG tablet Take 40 mg by mouth daily.     No facility-administered medications prior to visit.      Allergies:   Amlodipine besy-benazepril hcl; Amlodipine besy-benazepril hcl; Dust mite extract; and Lactase   Social History   Social History  . Marital status: Single    Spouse name: N/A  . Number of children: N/A  . Years of education: N/A   Social History Main Topics  . Smoking status: Never Smoker  . Smokeless tobacco: Never Used  . Alcohol use No  . Drug use: No  . Sexual activity: Not Asked   Other Topics Concern  . None   Social History Narrative  . None     Family History:  The patient's family history includes Coronary artery disease in her brother; Diabetes in her mother; Hypertension in her brother, mother, and sister.   ROS:   Please see the history of present illness.    ROS All other systems reviewed and are negative.  No flowsheet data found.     PHYSICAL EXAM:   VS:  BP 138/84   Pulse 81   Ht 4\' 11"  (1.499 m)   Wt 243 lb 12.8 oz (  110.6 kg)   BMI 49.24 kg/m    GEN: Well nourished, well developed, in no acute distress  HEENT: normal  Neck: no JVD, carotid bruits, or masses Cardiac: RRR; no murmurs, rubs, or gallops,no edema.  Intact distal pulses bilaterally.  Respiratory:  clear to auscultation bilaterally, normal work of breathing GI: soft, nontender, nondistended, + BS MS: no deformity or atrophy  Skin: warm and dry, no rash Neuro:  Alert and Oriented x 3, Strength and sensation are intact Psych: euthymic mood, full affect  Wt Readings from Last 3 Encounters:  09/16/16 243 lb 12.8 oz (110.6 kg)  09/25/15 252 lb 4 oz (114.4 kg)  09/16/15 253 lb 12.8 oz (115.1 kg)      Studies/Labs Reviewed:   EKG:  EKG is  ordered today.  The ekg ordered today demonstrates NSR with no ST changes and normal intervals  Recent Labs: No results found for requested labs within last 8760 hours.   Lipid  Panel No results found for: CHOL, TRIG, HDL, CHOLHDL, VLDL, LDLCALC, LDLDIRECT  Additional studies/ records that were reviewed today include:  none    ASSESSMENT:    1. Chest pain, unspecified type   2. Essential hypertension, benign   3. SOB (shortness of breath)      PLAN:  In order of problems listed above:  1. Chest pain - noncardiac with normal coronary arteries by cath and normal LVEDP.   2. HTN - Bp controlled on current meds. Continue amlodipine/ARB 3. SOB - this is chronic and related to morbid obesity and sedentary state.  Echo was normal.   I will see her back on a PRN basis  Medication Adjustments/Labs and Tests Ordered: Current medicines are reviewed at length with the patient today.  Concerns regarding medicines are outlined above.  Medication changes, Labs and Tests ordered today are listed in the Patient Instructions below.  Patient Instructions  Medication Instructions:  Your physician recommends that you continue on your current medications as directed. Please refer to the Current Medication list given to you today.   Labwork: None  Testing/Procedures: None  Follow-Up: Your physician recommends that you schedule a follow-up appointment AS NEEDED with Dr. Mayford Knifeurner.  Any Other Special Instructions Will Be Listed Below (If Applicable).     If you need a refill on your cardiac medications before your next appointment, please call your pharmacy.      Signed, Armanda Magicraci Turner, MD  09/16/2016 10:57 AM    [facility] [address], [place], [place]  [address] Phone: [phone]; Fax: [phone]

## 2016-09-16 NOTE — Patient Instructions (Signed)
Medication Instructions:  Your physician recommends that you continue on your current medications as directed. Please refer to the Current Medication list given to you today.   Labwork: None  Testing/Procedures: None  Follow-Up: Your physician recommends that you schedule a follow-up appointment AS NEEDED with Dr. Turner.  Any Other Special Instructions Will Be Listed Below (If Applicable).     If you need a refill on your cardiac medications before your next appointment, please call your pharmacy.   

## 2016-09-18 ENCOUNTER — Emergency Department (HOSPITAL_COMMUNITY)
Admission: EM | Admit: 2016-09-18 | Discharge: 2016-09-18 | Disposition: A | Discharge status: Home / Self Care | Payer: Medicaid Other | Attending: Emergency Medicine | Admitting: Emergency Medicine

## 2016-09-18 ENCOUNTER — Encounter (HOSPITAL_COMMUNITY): Payer: Self-pay | Admitting: *Deleted

## 2016-09-18 DIAGNOSIS — R51 Headache: Secondary | ICD-10-CM

## 2016-09-18 DIAGNOSIS — I10 Essential (primary) hypertension: Secondary | ICD-10-CM | POA: Diagnosis not present

## 2016-09-18 DIAGNOSIS — R519 Headache, unspecified: Secondary | ICD-10-CM

## 2016-09-18 MED ORDER — KETOROLAC TROMETHAMINE 60 MG/2ML IM SOLN
60.0000 mg | Freq: Once | INTRAMUSCULAR | Status: AC
  Administered 2016-09-18: 60 mg via INTRAMUSCULAR
  Filled 2016-09-18: qty 2
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
## 2016-09-18 NOTE — ED Triage Notes (Signed)
Pt woke up with high blood pressure (130/101) and also states she feels weird like when she has a high BP.  Pt also reports a headache.  Pt a/o x 4 and ambulatory. Pt reports taking all of her medications today.

## 2016-09-18 NOTE — Discharge Instructions (Signed)
Recommend to check your BP daily.  Ideally same time every day for most accurate comparisons. Follow-up with your primary care doctor about your blood pressure.  Also recommend to follow-up with your neurologist regarding your headaches as well. Return here for new concerns-- chest pain, shortness of breath, dizziness, weakness, etc

## 2016-10-11 ENCOUNTER — Emergency Department (HOSPITAL_COMMUNITY): Payer: Medicaid Other

## 2016-10-11 ENCOUNTER — Emergency Department (HOSPITAL_COMMUNITY)
Admission: EM | Admit: 2016-10-11 | Discharge: 2016-10-11 | Disposition: A | Discharge status: Home / Self Care | Payer: Medicaid Other | Attending: Emergency Medicine | Admitting: Emergency Medicine

## 2016-10-11 ENCOUNTER — Encounter (HOSPITAL_COMMUNITY): Payer: Self-pay | Admitting: Emergency Medicine

## 2016-10-11 DIAGNOSIS — I10 Essential (primary) hypertension: Secondary | ICD-10-CM | POA: Diagnosis not present

## 2016-10-11 DIAGNOSIS — Z79899 Other long term (current) drug therapy: Secondary | ICD-10-CM | POA: Insufficient documentation

## 2016-10-11 DIAGNOSIS — R0789 Other chest pain: Secondary | ICD-10-CM

## 2016-10-11 DIAGNOSIS — R0602 Shortness of breath: Secondary | ICD-10-CM | POA: Diagnosis present

## 2016-10-11 LAB — COMPREHENSIVE METABOLIC PANEL
ALK PHOS: 70 U/L (ref 38–126)
ALT: 16 U/L (ref 14–54)
AST: 21 U/L (ref 15–41)
Albumin: 4.2 g/dL (ref 3.5–5.0)
Anion gap: 6 (ref 5–15)
BUN: 16 mg/dL (ref 6–20)
CALCIUM: 8.9 mg/dL (ref 8.9–10.3)
CO2: 24 mmol/L (ref 22–32)
CREATININE: 1.04 mg/dL — AB (ref 0.44–1.00)
Chloride: 108 mmol/L (ref 101–111)
GFR calc non Af Amer: >60 mL/min (ref >60)
GLUCOSE: 135 mg/dL — AB (ref 65–99)
Potassium: 3.6 mmol/L (ref 3.5–5.1)
SODIUM: 138 mmol/L (ref 135–145)
Total Bilirubin: 0.5 mg/dL (ref 0.3–1.2)
Total Protein: 7.5 g/dL (ref 6.5–8.1)

## 2016-10-11 LAB — CBC WITH DIFFERENTIAL/PLATELET
Basophils Absolute: 0 10*3/uL (ref 0.0–0.1)
Basophils Relative: 1 %
EOS ABS: 0.1 10*3/uL (ref 0.0–0.7)
Eosinophils Relative: 1 %
HEMATOCRIT: 40.5 % (ref 36.0–46.0)
HEMOGLOBIN: 13.1 g/dL (ref 12.0–15.0)
LYMPHS ABS: 1.7 10*3/uL (ref 0.7–4.0)
LYMPHS PCT: 33 %
MCH: 29.8 pg (ref 26.0–34.0)
MCHC: 32.3 g/dL (ref 30.0–36.0)
MCV: 92 fL (ref 78.0–100.0)
Monocytes Absolute: 0.3 10*3/uL (ref 0.1–1.0)
Monocytes Relative: 6 %
NEUTROS ABS: 3.2 10*3/uL (ref 1.7–7.7)
NEUTROS PCT: 59 %
Platelets: 276 10*3/uL (ref 150–400)
RBC: 4.4 MIL/uL (ref 3.87–5.11)
RDW: 12.7 % (ref 11.5–15.5)
WBC: 5.3 10*3/uL (ref 4.0–10.5)

## 2016-10-11 LAB — D-DIMER, QUANTITATIVE: D-Dimer, Quant: 0.34 ug/mL-FEU (ref 0.00–0.50)

## 2016-10-11 LAB — I-STAT TROPONIN, ED
TROPONIN I, POC: 0 ng/mL (ref 0.00–0.08)
Troponin i, poc: 0 ng/mL (ref 0.00–0.08)

## 2016-10-11 NOTE — Discharge Instructions (Addendum)
The lab work and imaging we did in the ED today was negative.  Please see discharge paper to see the work up we did today.    The likelihood that you will have a major adverse cardiac event after discharge is less than 2%.   Please read attached discharge information on chest wall pain, non-specific chest pain, heartburn and angina pectoris.    It is important that you follow up with your primary care doctor within the next 2 days for further discussion of your symptoms and consideration of further cardiac work up and possible cardiology referral.  Please return to ED if you develop worsening chest pain associated with shortness of breath, light headedness, nausea, vomiting, sweating.

## 2016-10-11 NOTE — ED Notes (Signed)
Bed: WA21 Expected date:  Expected time:  Means of arrival:  Comments: 

## 2016-10-11 NOTE — ED Notes (Signed)
SANDWICH AND JUICE GIVEN AS REQUESTED.

## 2016-10-11 NOTE — ED Notes (Signed)
PA states to hold off on Blood work until she talks with MD

## 2016-10-11 NOTE — ED Provider Notes (Signed)
[facility] DEPT Provider Note   CSN: [id number] Arrival date & time: 10/11/16  1029  History   Chief Complaint Chief Complaint  Patient presents with  . Shortness of Breath    HPI Linda Cunningham is a 50 y.o. 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  Pt first noticed vague/dull L sided chest discomfort last week when she twisted/turned trunk to the R, this chest sensation has since changed and is more of an all over "chest heaviness" now.  Chest discomfort does not radiate to arms, jaw or back.  Pt has not found a pattern to chest discomfort, states there are no aggravating factors except pt occasionally feel this chest discomfort when taking deep breaths.  Pt states this chest sensation is makes taking deep breaths difficult causing her to feel short of breath.  Pt denies personal h/o Mi, PCI/CABG, CVA/TIA, PAD, tobacco use, high cholesterol, diabetes, family h/o CAD before age 52.  Pt reports adherence to GERD and HTN medications recently.  Pt reports acid reflux has been getting worse the last few days, especially at night, although she is adherent to BID PPis.   No fevers, n/v/d/c, no body aches, sweating, weakness/numbness of upper/lower extremities.  Pt denies lower extremity swelling or calf tenderness, no recent prolong travel, immobilization, surgery, no h/o PE/DVTs in the past. Pt never used tobacco, oral birth control medications.  No hemoptysis.    HPI  Past Medical History:  Diagnosis Date  . Hypertension   . OSA on CPAP    Follow[facility] by Dr. Catalina Lunger with [facility]  . Pseudotumor cerebri   . SOB (shortness of breath) 09/16/2016    Patient Active Problem List   Diagnosis Date Noted  . SOB (shortness of breath) 09/16/2016  . OSA on CPAP   . Abnormal nuclear stress test 09/25/2015  . Chest pain  01/05/2015  . Essential hypertension, benign 12/11/2013    Past Surgical History:  Procedure Laterality Date  . CARDIAC CATHETERIZATION N/A 09/25/2015   Procedure: Left Heart Cath and Coronary Angiography;  Surgeon: Peter M Swaziland, MD;  Location: [facility] INVASIVE CV LAB;  Service: Cardiovascular;  Laterality: N/A;  . CESAREAN SECTION      OB History    No data available       Home Medications    Prior to Admission medications   Medication Sig Start Date End Date Taking? Authorizing Provider  acetaZOLAMIDE (DIAMOX) 125 MG tablet Take 125 mg by mouth 2 (two) times daily. 09/22/16 09/22/17 Yes Historical Provider, MD  amLODipine (NORVASC) 5 MG tablet Take 5 mg by mouth every morning.    Yes Historical Provider, MD  Levonorgestrel (MIRENA, 52 MG, IU) by Intrauterine route.   Yes Historical Provider, MD  losartan (COZAAR) 100 MG tablet Take 100 mg by mouth every morning.    Yes Historical Provider, MD  pantoprazole (PROTONIX) 40 MG tablet Take 40 mg by mouth daily as needed (relux).    Yes Historical Provider, MD  ranitidine (ZANTAC) 150 MG tablet Take 150 mg by mouth at bedtime.   Yes Historical Provider, MD  rizatriptan (MAXALT) 10 MG tablet Take 10 mg by mouth as needed for migraine. May repeat in 2 hours if needed   Yes Historical Provider, MD  topiramate (TOPAMAX) 25 MG capsule Take 75 mg by mouth daily.   Yes Historical Provider, MD  Family History Family History  Problem Relation Age of Onset  . Hypertension Mother   . Diabetes Mother   . Hypertension Sister   . Hypertension Brother   . Coronary artery disease Brother     Social History Social History  Substance Use Topics  . Smoking status: Never Smoker  . Smokeless tobacco: Never Used  . Alcohol use No     Allergies   Amlodipine besy-benazepril hcl; Amlodipine besy-benazepril hcl; Dust mite extract; and Lactase   Review of Systems Review of Systems  All other systems reviewed and are negative.    Physical  Exam Updated Vital Signs BP 121/68 (BP Location: Right Arm)   Pulse 86   Temp 98.1 F (36.7 C) (Oral)   Resp 18   Ht 4\' 11"  (1.499 m)   Wt 110.2 kg   LMP 10/10/2016   SpO2 100%   BMI 49.08 kg/m   Physical Exam  Constitutional: She is oriented to person, place, and time. Vital signs are normal. She appears well-developed and well-nourished. No distress.  Obese pt found sitting at end of bed in no acute respiratory distress.  HENT:  Head: Normocephalic and atraumatic.  Nose: Nose normal.  Mouth/Throat: Oropharynx is clear and moist. No oropharyngeal exudate.  Eyes: EOM are normal. Pupils are equal, round, and reactive to light.  Neck: Normal range of motion. Neck supple. No JVD present.  Cardiovascular: Normal rate, regular rhythm, normal heart sounds and intact distal pulses.   No murmur heard. Pulmonary/Chest: Effort normal and breath sounds normal. No respiratory distress. She has no wheezes.  Abdominal: Soft. She exhibits no distension. There is no tenderness.  Musculoskeletal: Normal range of motion. She exhibits no edema (no lower extremity edema or calf tenderness) or tenderness.  Lymphadenopathy:    She has no cervical adenopathy.  Neurological: She is alert and oriented to person, place, and time.  Skin: Skin is warm and dry. Capillary refill takes less than 2 seconds.  Psychiatric: She has a normal mood and affect. Her behavior is normal.  Nursing note and vitals reviewed.    ED Treatments / Results  Labs (all labs ordered are listed, but only abnormal results are displayed) Labs Reviewed  COMPREHENSIVE METABOLIC PANEL - Abnormal; Notable for the following:       Result Value   Glucose, Bld 135 (*)    Creatinine, Ser 1.04 (*)    All other components within normal limits  CBC WITH DIFFERENTIAL/PLATELET  D-DIMER, QUANTITATIVE (NOT AT [facility])  Rosezena SensorI-STAT TROPOININ, ED  I-STAT TROPOININ, ED    EKG  EKG Interpretation  Date/Time:  Tuesday October 11 2016 10:46:52  EST Ventricular Rate:  80 PR Interval:    QRS Duration: 84 QT Interval:  360 QTC Calculation: 416 R Axis:   39 Text Interpretation:  Sinus rhythm Low voltage, precordial leads Confirmed by HAVILAND MD, JULIE ([id number]) on 10/11/2016 2:26:06 PM       Radiology Dg Chest 2 View  Result Date: 10/11/2016 CLINICAL DATA:  Chest pain EXAM: CHEST  2 VIEW COMPARISON:  08/13/2015 FINDINGS: The heart size and mediastinal contours are within normal limits. Both lungs are clear. The visualized skeletal structures are unremarkable. IMPRESSION: No active cardiopulmonary disease. Electronically Signed   By: Alcide CleverMark  Lukens M.D.   On: 10/11/2016 10:56    Procedures Procedures (including critical care time)  Medications Ordered in ED Medications - No data to display   Initial Impression / Assessment and Plan / ED Course  I have reviewed the  triage vital signs and the nursing notes.  Pertinent labs & imaging results that were available during my care of the patient were reviewed by me and considered in my medical decision making (see chart for details).  Clinical Course as of Oct 12 1136  Tue Oct 11, 2016  1437 Spoke to pt, pt initially wanted to leave to pick up son.  Explained to pt it would be too risky to discharge her with only a CXR and EKG.  Recommended she make phone call to get son a ride to wait for pending lab work.  Pt agreeable, understood and agreed to stay.  [CG]  1602 Re-evaluated pt, pt on the phone eating crackers.  VSS with slightly elevated SBP in 130s.  RRR, lung clear.  Pt denies chest discomfort currently.  Pending repeat troponin.  [CG]  1719 Pt discussed and signed off to oncoming PA, Fayrene Helper, who will follow up on repeat troponin.  If repeat troponin negative, VSS, dispo plan is to d/c home with close PCP follow up for further evaluation, possible cardiology referral and further testing.  [CG]    Clinical Course User Index [CG] Liberty Handy, PA-C    50 yo female  presents with intermittent, non exertional chest pain aggravated by taking deep breaths causing her to feel short of breath. Risk factors include obesity, HTN and prior stress test and L heart cath (09/2015) which showed medium defect c/w with ischemia with normal LVF with EF 56%.  I thoroughly reviewed patient's chart and it appears she presented to PCP and ED with similar chest pain last year, which started full cardiology work up, stress test and L heart cath.  Pt states today's chest pain is similar in character as previous episodes of chest pain last year however now she is having difficulty taking deep breaths, which concerned her and is why she came in to ED. From chart review, pt has seen a PCP every month this past year.  Pt denied chest pain and shortness of breath while in ED and was not administered any medications in ED. Chest pain is not likely of cardiac or pulmonary etiology d/t presentation, PERC negative, VSS, no tracheal deviation, no JVD or new murmur, RRR, breath sounds equal bilaterally, EKG without acute abnormalities, negative initial troponin (PENDING REPEAT TROPONIN), negative CXR, d-dimer negative.  CBC and BMP unremarkable. Heart Score=2, low with risk of MACE <2%. Pt has been advised continue taking PPI and return to the ED if CP becomes exertional, associated with diaphoresis or nausea, radiates to left jaw/arm, worsens or becomes concerning in any way or if shortness of breath worsens and/or occurs at rest. Pt appears reliable for follow up with PCP within the next 2-3 days, and is agreeable to discharge. Patient is in no acute distress. Vital Signs are stable prior to discharge. Patient is able to ambulate. Patient able to tolerate Po.  Patient is to be discharged with recommendation to follow up with PCP in regards to today's hospital visit. Discussed ED return precautions, pt verbalized understanding and agreed to dispo plan and close f/u with PCP.   Repeat troponin pending at end  of shift.  Discussed pending lab with pt, who understands dispo plan pending troponing.  Pt signed off to oncoming PA, Fayrene Helper, pending delta troponin.  If repeat troponin negative pt is to be discharged with close PCP f/u and ED return precautions.   Final Clinical Impressions(s) / ED Diagnoses   Final diagnoses:  Chest discomfort  New Prescriptions Discharge Medication List as of 10/11/2016  6:14 PM       Liberty HandyClaudia J Anelise Staron, PA-C 10/12/16 1137    Jacalyn LefevreJulie Haviland, MD 10/12/16 1358

## 2016-10-11 NOTE — ED Triage Notes (Signed)
Patient reports shortness of breath and chest congestion "for a few days."

## 2017-03-29 LAB — GLUCOSE, POCT (MANUAL RESULT ENTRY): POC Glucose: 106 mg/dl — AB (ref 70–99)

## 2017-03-30 ENCOUNTER — Other Ambulatory Visit: Payer: Self-pay | Admitting: Family Medicine

## 2017-03-30 DIAGNOSIS — Z1231 Encounter for screening mammogram for malignant neoplasm of breast: Secondary | ICD-10-CM

## 2017-05-05 ENCOUNTER — Ambulatory Visit
Admission: RE | Admit: 2017-05-05 | Discharge: 2017-05-05 | Disposition: A | Discharge status: Home / Self Care | Payer: Medicaid Other | Source: Ambulatory Visit | Attending: Family Medicine | Admitting: Family Medicine

## 2017-05-05 DIAGNOSIS — Z1231 Encounter for screening mammogram for malignant neoplasm of breast: Secondary | ICD-10-CM

## 2017-07-08 IMAGING — CR DG CHEST 2V
2 series · 2 of 2 positions shown · non-contrast
Comparison: 08/13/2015

CLINICAL DATA: Chest pain

EXAM:
CHEST  2 VIEW

[w chest pa]
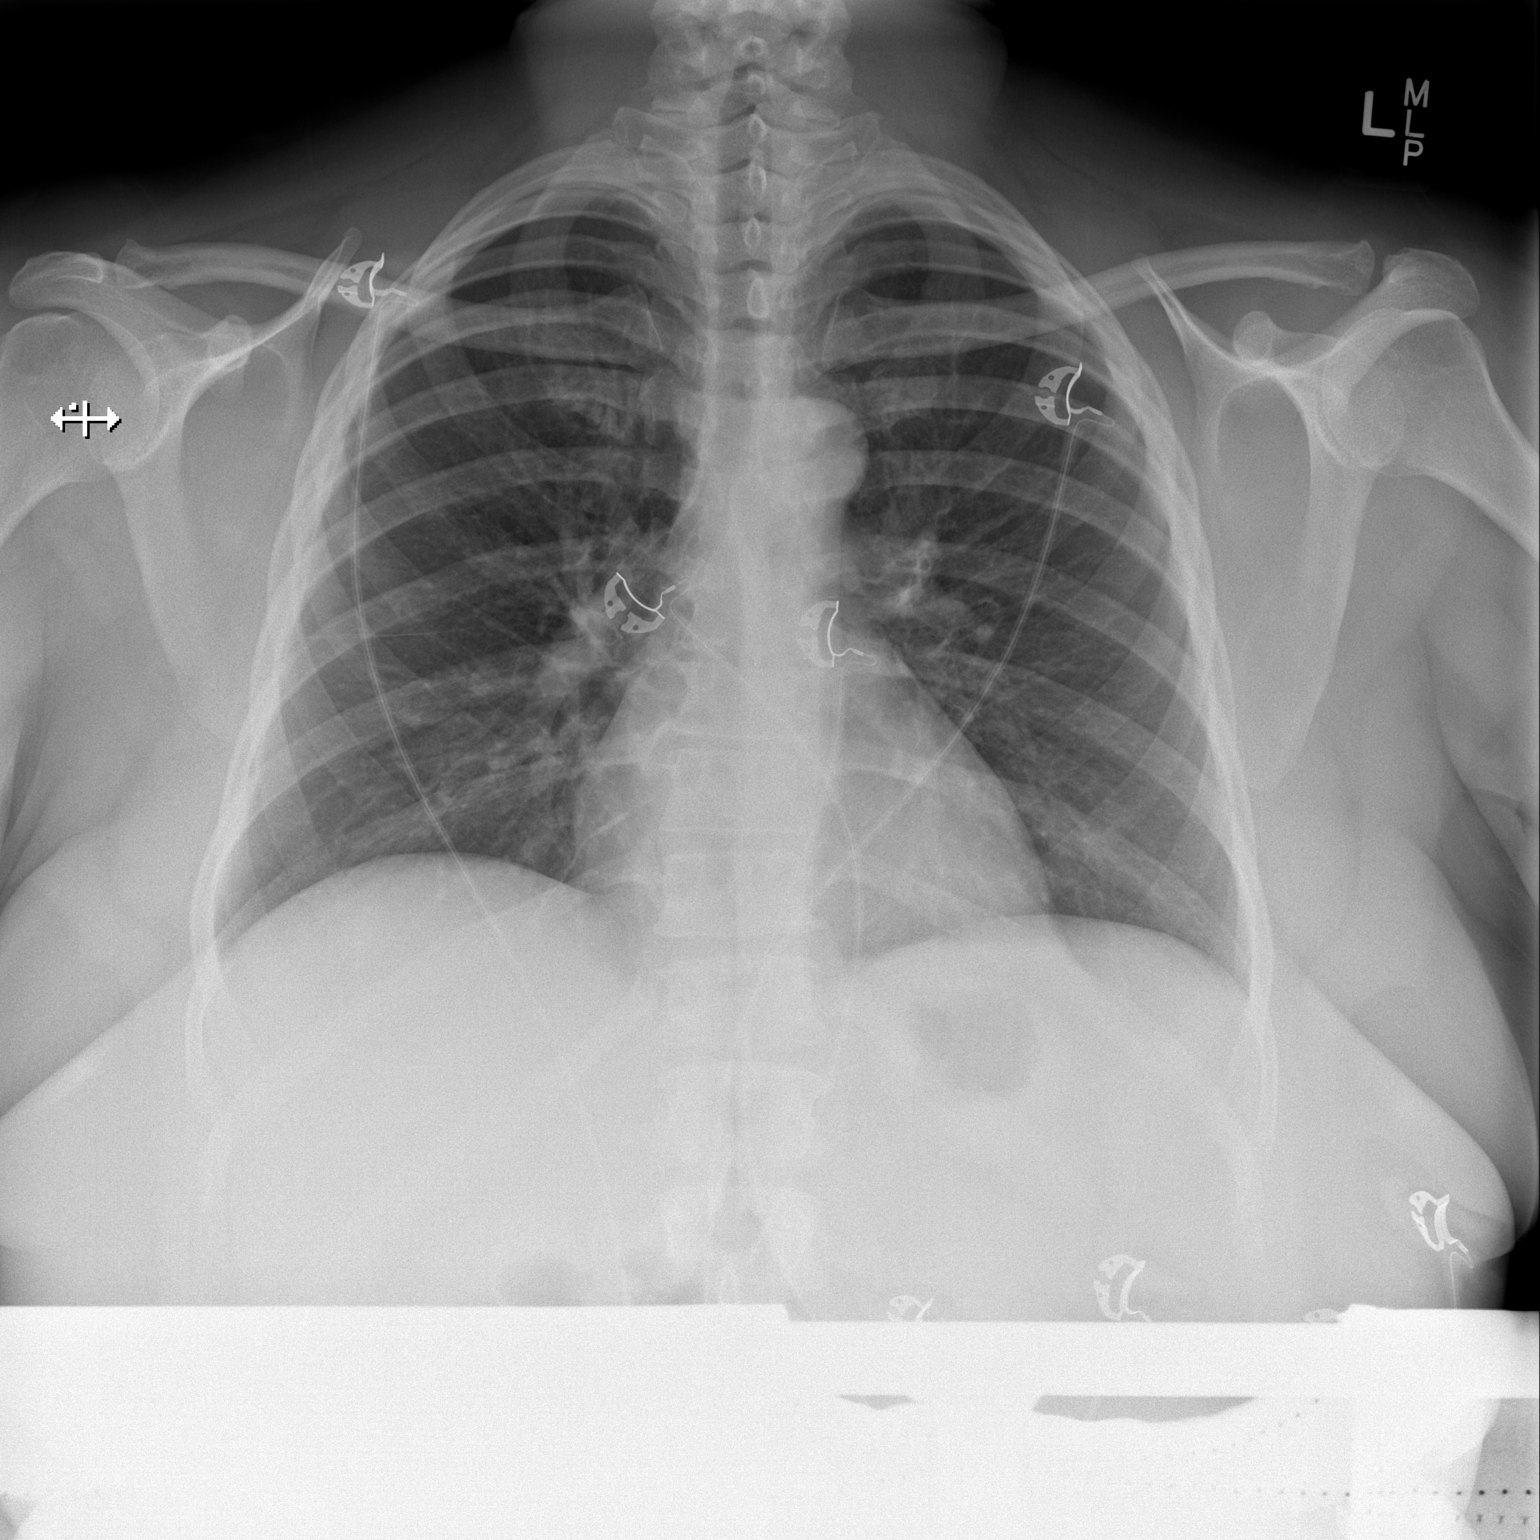

[w chest lat]
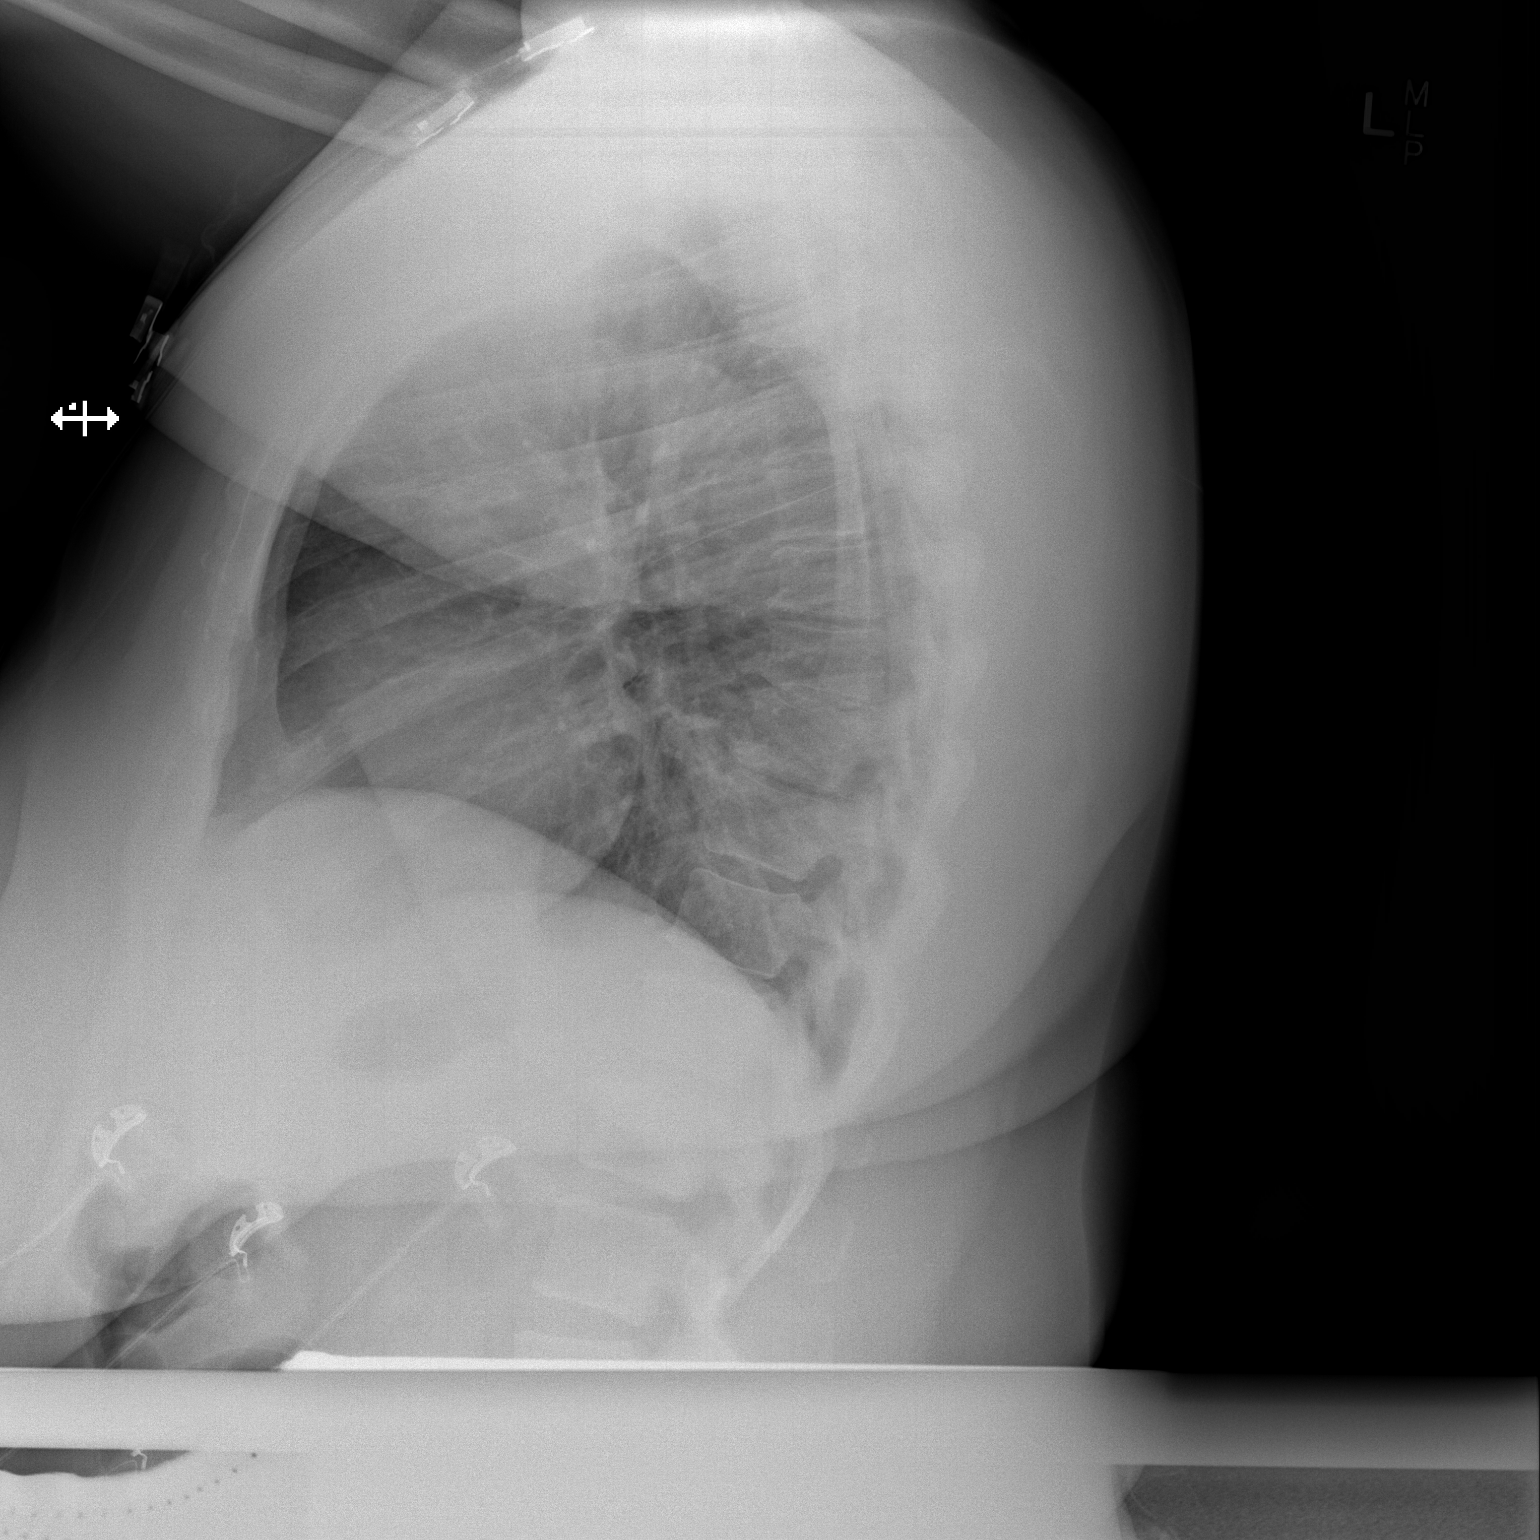

[2 of 2 positions shown; findings below may reference images not displayed]

FINDINGS: The heart size and mediastinal contours are within normal limits.
Both lungs are clear. The visualized skeletal structures are
unremarkable.
IMPRESSION: No active cardiopulmonary disease.

## 2018-04-10 ENCOUNTER — Other Ambulatory Visit: Payer: Self-pay | Admitting: Family Medicine

## 2018-04-10 DIAGNOSIS — Z1231 Encounter for screening mammogram for malignant neoplasm of breast: Secondary | ICD-10-CM

## 2018-05-07 ENCOUNTER — Ambulatory Visit
Admission: RE | Admit: 2018-05-07 | Discharge: 2018-05-07 | Disposition: A | Discharge status: Home / Self Care | Payer: Medicaid Other | Source: Ambulatory Visit | Attending: Family Medicine | Admitting: Family Medicine

## 2018-05-07 ENCOUNTER — Encounter: Payer: Self-pay | Admitting: Radiology

## 2018-05-07 DIAGNOSIS — Z1231 Encounter for screening mammogram for malignant neoplasm of breast: Secondary | ICD-10-CM

## 2019-04-08 ENCOUNTER — Other Ambulatory Visit: Payer: Self-pay | Admitting: Family Medicine

## 2019-04-08 DIAGNOSIS — Z1231 Encounter for screening mammogram for malignant neoplasm of breast: Secondary | ICD-10-CM

## 2019-05-24 ENCOUNTER — Ambulatory Visit
Admission: RE | Admit: 2019-05-24 | Discharge: 2019-05-24 | Disposition: A | Discharge status: Home / Self Care | Payer: Medicaid Other | Source: Ambulatory Visit | Attending: Family Medicine | Admitting: Family Medicine

## 2019-05-24 DIAGNOSIS — Z1231 Encounter for screening mammogram for malignant neoplasm of breast: Secondary | ICD-10-CM

## 2019-12-18 ENCOUNTER — Telehealth: Payer: Self-pay

## 2019-12-18 NOTE — Telephone Encounter (Signed)
NOTES ON FILE FROM [facility] [phone]

## 2020-01-07 DIAGNOSIS — M79603 Pain in arm, unspecified: Secondary | ICD-10-CM

## 2020-01-07 HISTORY — DX: Pain in arm, unspecified: M79.603

## 2020-01-07 NOTE — Progress Notes (Signed)
Cardiology Office Consult Note    Date:  01/08/2020   ID:  Linda Cunningham, DOB 10-18-1966, MRN [id number]  PCP:  [name], MD  Cardiologist:  Fransico Him, MD   No chief complaint on file.   History of Present Illness:  Linda Cunningham is a 54 y.o. female who is being seen today for the evaluation of chest pain at the request of [name], MD.  This is a 54yo obese female with a hx of HTN, GERD, OSA on PAP, Pseudotumor cerebri who is referred for evaluation of chest pain.  She has a hx of chest pain in the past and was evaluated by me about 4 years ago.  She is here today for evaluation of a "strange feeling" in her chest that she cannot really describe.  She says that mainly it occurs when she lays down at night to sleep.  It feels like she is getting cold in her chest with cough but she is not sick.  She has a lot of phlegm in her throat and is seeing GI for her GERD.  She is on CPAP for her OSA and is doing well with it. She tells me that she gets this sensation even when using her PAP device.  It usually starts at night and can last into the next day.  It is not worsened with exertion and not associated with palpitations, nausea or diaphoresis. She denies any dizziness.    Past Medical History:  Diagnosis Date  . Abnormal nuclear stress test 09/25/2015  . Chest pain 01/05/2015  . Epigastric pain   . Essential hypertension, benign 12/11/2013  . Hypertension   . OSA on CPAP    Followed by Dr. Sima Matas with [facility]  . Pain of upper extremity 01/07/2020  . Pseudotumor cerebri   . SOB (shortness of breath) 09/16/2016    Past Surgical History:  Procedure Laterality Date  . CARDIAC CATHETERIZATION N/A 09/25/2015   Procedure: Left Heart Cath and Coronary Angiography;  Surgeon: Peter M Martinique, MD;  Location: [place] [facility];  Service: Cardiovascular;  Laterality: N/A;  . CESAREAN SECTION      Current Medications: Current Meds  Medication Sig  . acetaminophen  (TYLENOL) 325 MG tablet Take 650 mg by mouth every 6 (six) hours as needed.  Marland Kitchen acetaZOLAMIDE (DIAMOX) 125 MG tablet Take 125 mg by mouth 2 (two) times daily.  Marland Kitchen amLODipine (NORVASC) 5 MG tablet Take 5 mg by mouth every morning.   . baclofen (LIORESAL) 10 MG tablet Take 10 mg by mouth as needed for muscle spasms.  . cetirizine (ZYRTEC) 10 MG tablet Take 10 mg by mouth daily.  . diclofenac Sodium (VOLTAREN) 1 % GEL Apply topically.  Eduard Roux (AIMOVIG) 140 MG/ML SOAJ Inject into the skin every 30 (thirty) days.  . famotidine (PEPCID) 20 MG tablet Take by mouth.  . fluticasone (FLONASE) 50 MCG/ACT nasal spray Place into the nose.  Marland Kitchen Fluticasone Furoate 50 MCG/ACT AEPB Inhale 50 mcg into the lungs daily.  . hydrocortisone 2.5 % cream Apply topically 2 (two) times daily.  Marland Kitchen ketoconazole (NIZORAL) 2 % cream Apply 1 application topically 2 (two) times daily.  . Levonorgestrel (MIRENA, 52 MG, IU) by Intrauterine route.  . lidocaine (XYLOCAINE) 1 % (with preservative) injection Inject into the muscle.  . losartan (COZAAR) 100 MG tablet Take 100 mg by mouth every morning.   . montelukast (SINGULAIR) 10 MG tablet Take by mouth.  . pantoprazole (PROTONIX)  40 MG tablet Take 40 mg by mouth daily as needed (relux).   . ranitidine (ZANTAC) 150 MG tablet Take 150 mg by mouth at bedtime.  . rizatriptan (MAXALT) 10 MG tablet Take 10 mg by mouth as needed for migraine. May repeat in 2 hours if needed  . topiramate (TOPAMAX) 25 MG capsule Take 75 mg by mouth daily.  Marland Kitchen triamcinolone cream (KENALOG) 0.1 % APPLY TO AREA ON LEG TWICE DAILY UNTIL IMPROVED    Allergies:   Amlodipine besy-benazepril hcl, Dust mite extract, Other, Amlodipine besy-benazepril hcl, and Lactase   Social History   Socioeconomic History  . Marital status: Single    Spouse name: Not on file  . Number of children: Not on file  . Years of education: Not on file  . Highest education level: Not on file  Occupational History  . Not  on file  Tobacco Use  . Smoking status: Never Smoker  . Smokeless tobacco: Never Used  Substance and Sexual Activity  . Alcohol use: No  . Drug use: No  . Sexual activity: Not on file  Other Topics Concern  . Not on file  Social History Narrative  . Not on file   Social Determinants of Health   Financial Resource Strain:   . Difficulty of Paying Living Expenses: Not on file  Food Insecurity:   . Worried About Programme researcher, broadcasting/film/video in the Last Year: Not on file  . Ran Out of Food in the Last Year: Not on file  Transportation Needs:   . Lack of Transportation (Medical): Not on file  . Lack of Transportation (Non-Medical): Not on file  Physical Activity:   . Days of Exercise per Week: Not on file  . Minutes of Exercise per Session: Not on file  Stress:   . Feeling of Stress : Not on file  Social Connections:   . Frequency of Communication with Friends and Family: Not on file  . Frequency of Social Gatherings with Friends and Family: Not on file  . Attends Religious Services: Not on file  . Active Member of Clubs or Organizations: Not on file  . Attends Banker Meetings: Not on file  . Marital Status: Not on file     Family History:  The patient's family history includes Coronary artery disease in her brother; Diabetes in her mother; Hypertension in her brother, mother, and sister.   ROS:   Please see the history of present illness.    ROS All other systems reviewed and are negative.  No flowsheet data found.     PHYSICAL EXAM:   VS:  BP 116/82   Pulse 75   Ht 4\' 11"  (1.499 m)   Wt 222 lb 12.8 oz (101.1 kg)   BMI 45.00 kg/m    GEN: Well nourished, well developed, in no acute distress  HEENT: normal  Neck: no JVD, carotid bruits, or masses Cardiac: RRR; no murmurs, rubs, or gallops,no edema.  Intact distal pulses bilaterally.  Respiratory:  clear to auscultation bilaterally, normal work of breathing GI: soft, nontender, nondistended, + BS MS: no  deformity or atrophy  Skin: warm and dry, no rash Neuro:  Alert and Oriented x 3, Strength and sensation are intact Psych: euthymic mood, full affect  Wt Readings from Last 3 Encounters:  01/08/20 222 lb 12.8 oz (101.1 kg)  10/11/16 243 lb (110.2 kg)  09/18/16 243 lb (110.2 kg)      Studies/Labs Reviewed:   EKG:  EKG is  ordered today.  The ekg ordered today demonstrates NSR with no ST changes  Recent Labs: No results found for requested labs within last 8760 hours.   Lipid Panel No results found for: CHOL, TRIG, HDL, CHOLHDL, VLDL, LDLCALC, LDLDIRECT  Additional studies/ records that were reviewed today include:  Office notes from PCP    ASSESSMENT:    1. Chest pain of uncertain etiology   2. SOB (shortness of breath)      PLAN:  In order of problems listed above:  1. Chest pain -her description is not really pain but a feeling that she is getting cold in her chest -she had a normal cath in 2016 so I do not think this is coronary ischemia -likely related to GERD -will check a chest xray and also get a 2D echo to assess LVF  2.  SOB -this occurs mainly at night but again, does not really sound like she is SOB but that she feels she is getting chest cold but is not sick - see above #1  2. Morbid obesity -I have encouraged her to get into a routine exercise program and cut back on carbs and portions.      Medication Adjustments/Labs and Tests Ordered: Current medicines are reviewed at length with the patient today.  Concerns regarding medicines are outlined above.  Medication changes, Labs and Tests ordered today are listed in the Patient Instructions below.  Patient Instructions  Medication Instructions:  Your physician recommends that you continue on your current medications as directed. Please refer to the Current Medication list given to you today.  *If you need a refill on your cardiac medications before your next appointment, please call your  pharmacy*   Testing/Procedures: A chest x-ray takes a picture of the organs and structures inside the chest, including the heart, lungs, and blood vessels. This test can show several things, including, whether the heart is enlarges; whether fluid is building up in the lungs; and whether pacemaker / defibrillator leads are still in place.  Your physician has requested that you have an echocardiogram. Echocardiography is a painless test that uses sound waves to create images of your heart. It provides your doctor with information about the size and shape of your heart and how well your heart's chambers and valves are working. This procedure takes approximately one hour. There are no restrictions for this procedure.  Follow-Up: At [facility], you and your health needs are our priority.  As part of our continuing mission to provide you with exceptional heart care, we have created designated Provider Care Teams.  These Care Teams include your primary Cardiologist (physician) and Advanced Practice Providers (APPs -  Physician Assistants and Nurse Practitioners) who all work together to provide you with the care you need, when you need it.  We recommend signing up for the patient portal called "MyChart".  Sign up information is provided on this After Visit Summary.  MyChart is used to connect with patients for Virtual Visits (Telemedicine).  Patients are able to view lab/test results, encounter notes, upcoming appointments, etc.  Non-urgent messages can be sent to your provider as well.   To learn more about what you can do with MyChart, go to [email or web address].      Your next appointment:   6 month(s)  The format for your next appointment:   In Person  Provider:   Armanda Magic, MD      Signed, Armanda Magic, MD  01/08/2020 2:08 PM      [facility] [place], [place], [place]  [address] Phone: [phone]; Fax: [phone]

## 2020-01-08 ENCOUNTER — Other Ambulatory Visit: Payer: Self-pay

## 2020-01-08 ENCOUNTER — Encounter: Payer: Self-pay | Admitting: Cardiology

## 2020-01-08 ENCOUNTER — Ambulatory Visit: Payer: Medicaid Other | Admitting: Cardiology

## 2020-01-08 VITALS — BP 116/82 | HR 75 | Ht 59.0 in | Wt 222.8 lb

## 2020-01-08 DIAGNOSIS — R079 Chest pain, unspecified: Secondary | ICD-10-CM | POA: Diagnosis not present

## 2020-01-08 DIAGNOSIS — R0602 Shortness of breath: Secondary | ICD-10-CM | POA: Diagnosis not present

## 2020-01-08 NOTE — Patient Instructions (Signed)
Medication Instructions:  Your physician recommends that you continue on your current medications as directed. Please refer to the Current Medication list given to you today.  *If you need a refill on your cardiac medications before your next appointment, please call your pharmacy*   Testing/Procedures: A chest x-ray takes a picture of the organs and structures inside the chest, including the heart, lungs, and blood vessels. This test can show several things, including, whether the heart is enlarges; whether fluid is building up in the lungs; and whether pacemaker / defibrillator leads are still in place.  Your physician has requested that you have an echocardiogram. Echocardiography is a painless test that uses sound waves to create images of your heart. It provides your doctor with information about the size and shape of your heart and how well your heart's chambers and valves are working. This procedure takes approximately one hour. There are no restrictions for this procedure.  Follow-Up: At [facility], you and your health needs are our priority.  As part of our continuing mission to provide you with exceptional heart care, we have created designated Provider Care Teams.  These Care Teams include your primary Cardiologist (physician) and Advanced Practice Providers (APPs -  Physician Assistants and Nurse Practitioners) who all work together to provide you with the care you need, when you need it.  We recommend signing up for the patient portal called "MyChart".  Sign up information is provided on this After Visit Summary.  MyChart is used to connect with patients for Virtual Visits (Telemedicine).  Patients are able to view lab/test results, encounter notes, upcoming appointments, etc.  Non-urgent messages can be sent to your provider as well.   To learn more about what you can do with MyChart, go to [email or web address].      Your next appointment:   6 month(s)  The format for your  next appointment:   In Person  Provider:   Armanda Magic, MD

## 2020-01-09 ENCOUNTER — Ambulatory Visit
Admission: RE | Admit: 2020-01-09 | Discharge: 2020-01-09 | Disposition: A | Discharge status: Home / Self Care | Payer: Medicaid Other | Source: Ambulatory Visit | Attending: Cardiology | Admitting: Cardiology

## 2020-01-09 DIAGNOSIS — R0602 Shortness of breath: Secondary | ICD-10-CM

## 2020-01-28 ENCOUNTER — Other Ambulatory Visit: Payer: Self-pay

## 2020-01-28 ENCOUNTER — Ambulatory Visit (HOSPITAL_COMMUNITY): Payer: Medicaid Other | Attending: Cardiology

## 2020-01-28 DIAGNOSIS — R0602 Shortness of breath: Secondary | ICD-10-CM | POA: Insufficient documentation

## 2020-03-31 ENCOUNTER — Other Ambulatory Visit: Payer: Self-pay | Admitting: Family Medicine

## 2020-05-07 ENCOUNTER — Other Ambulatory Visit: Payer: Self-pay | Admitting: Family Medicine

## 2020-05-07 DIAGNOSIS — Z1231 Encounter for screening mammogram for malignant neoplasm of breast: Secondary | ICD-10-CM

## 2020-05-27 ENCOUNTER — Other Ambulatory Visit: Payer: Self-pay

## 2020-05-27 ENCOUNTER — Ambulatory Visit
Admission: RE | Admit: 2020-05-27 | Discharge: 2020-05-27 | Disposition: A | Discharge status: Home / Self Care | Payer: Medicaid Other | Source: Ambulatory Visit | Attending: Family Medicine | Admitting: Family Medicine

## 2020-05-27 DIAGNOSIS — Z1231 Encounter for screening mammogram for malignant neoplasm of breast: Secondary | ICD-10-CM

## 2020-06-26 NOTE — Progress Notes (Signed)
Cardiology Office  Note    Date:  06/29/2020   ID:  Linda Cunningham, DOB 05/09/1966, MRN [id number]  PCP:  Macy Mis, MD  Cardiologist:  Armanda Magic, MD   Chief Complaint  Patient presents with  . Chest Pain  . Shortness of Breath    History of Present Illness:  Linda Cunningham is a 54yo obese female with a hx of HTN, GERD, OSA on PAP, Pseudotumor cerebri who I initially saw in March with complaints of Chest discomfort that she described as feeling like she had a cold in her chest.  She had an evaluation for chest pain in 2016 including LHC showing normal coronary arteries and normal LVF.   When I saw her a few months ago she had a lot of phlegm in her throat and was seeing GI for her GERD.  She is on CPAP for her OSA and is doing well with it. At last OV she said that the chest sensation was present even when using her PAP device.  It usually started at night and would last into the next day.  It was not worsened with exertion and not associated with palpitations, nausea or diaphoresis. It was felt to be related to GERD.  2D echo showed normal LVF with mild LVH and trivial MR.    She is here today for followup and is doing well.  She still has that sensation of chest discomfort in her chest that was similar to in March with no improvement with Protonix.  There is no radiation of the discomfort and it is nonexertional.  She still feels like it is a cold in her chest.  It is worse when she takes a deep breath in.  She still has some DOE as well.  She denies any PND, orthopnea, LE edema, dizziness, palpitations or syncope. She is compliant with her meds and is tolerating meds with no SE.    Past Medical History:  Diagnosis Date  . Abnormal nuclear stress test 09/25/2015  . Chest pain 01/05/2015  . Epigastric pain   . Essential hypertension, benign 12/11/2013  . Hypertension   . OSA on CPAP    Followed by Dr. Catalina Lunger with [facility]  . Pain of upper extremity 01/07/2020  . Pseudotumor  cerebri   . SOB (shortness of breath) 09/16/2016    Past Surgical History:  Procedure Laterality Date  . CARDIAC CATHETERIZATION N/A 09/25/2015   Procedure: Left Heart Cath and Coronary Angiography;  Surgeon: Peter M Swaziland, MD;  Location: [facility];  Service: Cardiovascular;  Laterality: N/A;  . CESAREAN SECTION      Current Medications: Current Meds  Medication Sig  . acetaminophen (TYLENOL) 325 MG tablet Take 650 mg by mouth every 6 (six) hours as needed.  Marland Kitchen amLODipine (NORVASC) 5 MG tablet Take 5 mg by mouth every morning.   . baclofen (LIORESAL) 10 MG tablet Take 10 mg by mouth as needed for muscle spasms.  . cetirizine (ZYRTEC) 10 MG tablet Take 10 mg by mouth daily.  . diclofenac Sodium (VOLTAREN) 1 % GEL Apply 2 g topically 4 (four) times daily as needed.   Dorise Hiss (AIMOVIG) 140 MG/ML SOAJ Inject into the skin every 30 (thirty) days.  . famotidine (PEPCID) 20 MG tablet Take 20 mg by mouth daily as needed for heartburn or indigestion.   . fluticasone (FLONASE) 50 MCG/ACT nasal spray Place 1 spray into the nose daily as needed for allergies.  . Fluticasone  Furoate 50 MCG/ACT AEPB Inhale 50 mcg into the lungs daily as needed.   . hydrocortisone 2.5 % cream Apply 1 application topically 2 (two) times daily as needed.   Marland Kitchen ketoconazole (NIZORAL) 2 % cream Apply 1 application topically 2 (two) times daily as needed.   Marland Kitchen ketorolac (TORADOL) 10 MG tablet Take 1 tablet by mouth every 6 (six) hours.  . Levonorgestrel (MIRENA, 52 MG, IU) by Intrauterine route.  Marland Kitchen losartan (COZAAR) 100 MG tablet Take 100 mg by mouth every morning.   . montelukast (SINGULAIR) 10 MG tablet Take 1 tablet by mouth daily.  . pantoprazole (PROTONIX) 40 MG tablet Take 1 tablet by mouth 2 (two) times daily.   . rizatriptan (MAXALT) 10 MG tablet Take 10 mg by mouth as needed for migraine. May repeat in 2 hours if needed  . topiramate (TOPAMAX) 25 MG capsule Take 75 mg by mouth daily.  Marland Kitchen  triamcinolone cream (KENALOG) 0.1 % APPLY TO AREA ON LEG TWICE DAILY UNTIL IMPROVED  . [DISCONTINUED] acetaZOLAMIDE (DIAMOX) 125 MG tablet Take 125 mg by mouth 2 (two) times daily.  . [DISCONTINUED] fluticasone (FLONASE) 50 MCG/ACT nasal spray Place into the nose.  . [DISCONTINUED] montelukast (SINGULAIR) 10 MG tablet Take by mouth.  . [DISCONTINUED] pantoprazole (PROTONIX) 40 MG tablet Take 40 mg by mouth daily as needed (relux).   . [DISCONTINUED] ranitidine (ZANTAC) 150 MG tablet Take 150 mg by mouth at bedtime.    Allergies:   Amlodipine besy-benazepril hcl, Dust mite extract, Other, Amlodipine besy-benazepril hcl, and Lactase   Social History   Socioeconomic History  . Marital status: Single    Spouse name: Not on file  . Number of children: Not on file  . Years of education: Not on file  . Highest education level: Not on file  Occupational History  . Not on file  Tobacco Use  . Smoking status: Never Smoker  . Smokeless tobacco: Never Used  Substance and Sexual Activity  . Alcohol use: No  . Drug use: No  . Sexual activity: Not on file  Other Topics Concern  . Not on file  Social History Narrative  . Not on file   Social Determinants of Health   Financial Resource Strain:   . Difficulty of Paying Living Expenses: Not on file  Food Insecurity:   . Worried About Programme researcher, broadcasting/film/video in the Last Year: Not on file  . Ran Out of Food in the Last Year: Not on file  Transportation Needs:   . Lack of Transportation (Medical): Not on file  . Lack of Transportation (Non-Medical): Not on file  Physical Activity:   . Days of Exercise per Week: Not on file  . Minutes of Exercise per Session: Not on file  Stress:   . Feeling of Stress : Not on file  Social Connections:   . Frequency of Communication with Friends and Family: Not on file  . Frequency of Social Gatherings with Friends and Family: Not on file  . Attends Religious Services: Not on file  . Active Member of Clubs or  Organizations: Not on file  . Attends Banker Meetings: Not on file  . Marital Status: Not on file     Family History:  The patient's family history includes Coronary artery disease in her brother; Diabetes in her mother; Hypertension in her brother, mother, and sister.   ROS:   Please see the history of present illness.    ROS All other systems reviewed  and are negative.  No flowsheet data found.     PHYSICAL EXAM:   VS:  BP 120/72   Pulse 74   Ht 4\' 11"  (1.499 m)   Wt 214 lb (97.1 kg)   SpO2 97%   BMI 43.22 kg/m    GEN: Well nourished, well developed in no acute distress HEENT: Normal NECK: No JVD; No carotid bruits LYMPHATICS: No lymphadenopathy CARDIAC:RRR, no murmurs, rubs, gallops RESPIRATORY:  Clear to auscultation without rales, wheezing or rhonchi  ABDOMEN: Soft, non-tender, non-distended MUSCULOSKELETAL:  No edema; No deformity  SKIN: Warm and dry NEUROLOGIC:  Alert and oriented x 3 PSYCHIATRIC:  Normal affect    Wt Readings from Last 3 Encounters:  06/29/20 214 lb (97.1 kg)  01/08/20 222 lb 12.8 oz (101.1 kg)  10/11/16 243 lb (110.2 kg)      Studies/Labs Reviewed:   EKG:  EKG is ordered today.  The ekg ordered today demonstrates NSR with no ST changes  Recent Labs: No results found for requested labs within last 8760 hours.   Lipid Panel No results found for: CHOL, TRIG, HDL, CHOLHDL, VLDL, LDLCALC, LDLDIRECT  Additional studies/ records that were reviewed today include:  Office notes from PCP    ASSESSMENT:    1. Atypical chest pain   2. SOB (shortness of breath)   3. Morbid obesity (HCC)      PLAN:  In order of problems listed above:  1.  Atypical chest pain -her description in March was not really pain but a feeling that she was getting cold in her chest -she had a normal cath in 2016 and 2D echo 01/2020 was normal except for LVH -her Protonix was increased but she still has the sensation in her chest that is  nonexertional -I will get a coronary CTA to assess for CAD  2.  SOB -she still has this ? Whether related to obesity -2D echo with normal LVF and Cxray was normal -I will refer her back to Pulmonary  2. Morbid obesity -I have encouraged her to get into a routine exercise program and cut back on carbs and portions.     Medication Adjustments/Labs and Tests Ordered: Current medicines are reviewed at length with the patient today.  Concerns regarding medicines are outlined above.  Medication changes, Labs and Tests ordered today are listed in the Patient Instructions below.  There are no Patient Instructions on file for this visit.   Signed, 02/2020, MD  06/29/2020 10:03 AM    [facility] [address] [place], [place], [place]  [place] Phone: [phone]; Fax: [phone]

## 2020-06-29 ENCOUNTER — Other Ambulatory Visit: Payer: Self-pay

## 2020-06-29 ENCOUNTER — Encounter: Payer: Self-pay | Admitting: Cardiology

## 2020-06-29 ENCOUNTER — Ambulatory Visit (INDEPENDENT_AMBULATORY_CARE_PROVIDER_SITE_OTHER): Payer: Medicaid Other | Admitting: Cardiology

## 2020-06-29 VITALS — BP 120/72 | HR 74 | Ht 59.0 in | Wt 214.0 lb

## 2020-06-29 DIAGNOSIS — R072 Precordial pain: Secondary | ICD-10-CM | POA: Diagnosis not present

## 2020-06-29 DIAGNOSIS — R0602 Shortness of breath: Secondary | ICD-10-CM

## 2020-06-29 DIAGNOSIS — R0789 Other chest pain: Secondary | ICD-10-CM

## 2020-06-29 MED ORDER — METOPROLOL TARTRATE 100 MG PO TABS: 100.0000 mg | ORAL_TABLET | Freq: Once | ORAL | 0 refills | Status: DC

## 2020-06-29 NOTE — Patient Instructions (Addendum)
Medication Instructions:  Your physician recommends that you continue on your current medications as directed. Please refer to the Current Medication list given to you today.  *If you need a refill on your cardiac medications before your next appointment, please call your pharmacy*   Follow-Up: At [facility], you and your health needs are our priority.  As part of our continuing mission to provide you with exceptional heart care, we have created designated Provider Care Teams.  These Care Teams include your primary Cardiologist (physician) and Advanced Practice Providers (APPs -  Physician Assistants and Nurse Practitioners) who all work together to provide you with the care you need, when you need it.  We recommend signing up for the patient portal called "MyChart".  Sign up information is provided on this After Visit Summary.  MyChart is used to connect with patients for Virtual Visits (Telemedicine).  Patients are able to view lab/test results, encounter notes, upcoming appointments, etc.  Non-urgent messages can be sent to your provider as well.   To learn more about what you can do with MyChart, go to [email or web address].    Your next appointment:   6 month(s)  The format for your next appointment:   In Person  Provider:   You may see Fransico Him, MD or one of the following Advanced Practice Providers on your designated Care Team:    Melina Copa, PA-C  Ermalinda Barrios, PA-C    Other Instructions Your cardiac CT will be scheduled at one of the below locations:   [facility] [address] [place], [place] [address] [phone]  Please arrive at the [facility] main entrance of [facility] 30 minutes prior to test start time. Proceed to the [facility] Radiology Department (first floor) to check-in and test prep.   Please follow these instructions carefully (unless otherwise directed):  On the Night Before the Test: . Be sure to Drink plenty of  water. . Do not consume any caffeinated/decaffeinated beverages or chocolate 12 hours prior to your test. . Do not take any antihistamines 12 hours prior to your test.  On the Day of the Test: . Drink plenty of water. Do not drink any water within one hour of the test. . Do not eat any food 4 hours prior to the test. . You may take your regular medications prior to the test.  . Take metoprolol (Lopressor) two hours prior to test. . FEMALES- please wear underwire-free bra if available       After the Test: . Drink plenty of water. . After receiving IV contrast, you may experience a mild flushed feeling. This is normal. . On occasion, you may experience a mild rash up to 24 hours after the test. This is not dangerous. If this occurs, you can take Benadryl 25 mg and increase your fluid intake. . If you experience trouble breathing, this can be serious. If it is severe call 911 IMMEDIATELY. If it is mild, please call our office. . If you take any of these medications: Glipizide/Metformin, Avandament, Glucavance, please do not take 48 hours after completing test unless otherwise instructed.   Once we have confirmed authorization from your insurance company, we will call you to set up a date and time for your test. Based on how quickly your insurance processes prior authorizations requests, please allow up to 4 weeks to be contacted for scheduling your Cardiac CT appointment. Be advised that routine Cardiac CT appointments could be scheduled as many as 8  weeks after your provider has ordered it.  For non-scheduling related questions, please contact the cardiac imaging nurse navigator should you have any questions/concerns: Marchia Bond, Cardiac Imaging Nurse Navigator Burley Saver, Interim Cardiac Imaging Nurse [place] [facility] Direct Office Dial: [phone]   For scheduling needs, including cancellations and rescheduling, please call Vivien Rota at [phone], option  3.

## 2020-06-29 NOTE — Addendum Note (Signed)
Addended by: Theresia Majors on: 06/29/2020 10:17 AM   Modules accepted: Orders

## 2020-07-16 ENCOUNTER — Telehealth (HOSPITAL_COMMUNITY): Payer: Self-pay | Admitting: *Deleted

## 2020-07-16 NOTE — Telephone Encounter (Signed)
Reaching out to patient to offer assistance regarding upcoming cardiac imaging study; pt verbalizes understanding of appt date/time, parking situation and where to check in, pre-test NPO status and medications ordered, and verified current allergies; name and call back number provided for further questions should they arise ° °Krishauna Schatzman Tai RN Navigator Cardiac Imaging ° Heart and Vascular °[phone] office °[phone] cell ° °

## 2020-07-17 ENCOUNTER — Encounter (HOSPITAL_COMMUNITY): Payer: Self-pay

## 2020-07-17 ENCOUNTER — Telehealth: Payer: Self-pay

## 2020-07-17 ENCOUNTER — Telehealth (HOSPITAL_COMMUNITY): Payer: Self-pay | Admitting: Emergency Medicine

## 2020-07-17 ENCOUNTER — Ambulatory Visit (HOSPITAL_COMMUNITY)
Admission: RE | Admit: 2020-07-17 | Discharge: 2020-07-17 | Disposition: A | Discharge status: Home / Self Care | Payer: Medicaid Other | Source: Ambulatory Visit | Attending: Cardiology | Admitting: Cardiology

## 2020-07-17 DIAGNOSIS — R072 Precordial pain: Secondary | ICD-10-CM | POA: Diagnosis present

## 2020-07-17 DIAGNOSIS — R079 Chest pain, unspecified: Secondary | ICD-10-CM

## 2020-07-17 MED ORDER — NITROGLYCERIN 0.4 MG SL SUBL
SUBLINGUAL_TABLET | SUBLINGUAL | Status: AC
  Filled 2020-07-17: qty 2

## 2020-07-17 MED ORDER — IOHEXOL 350 MG/ML SOLN
80.0000 mL | Freq: Once | INTRAVENOUS | Status: AC | PRN
  Administered 2020-07-17: 80 mL via INTRAVENOUS

## 2020-07-17 MED ORDER — NITROGLYCERIN 0.4 MG SL SUBL
0.8000 mg | SUBLINGUAL_TABLET | Freq: Once | SUBLINGUAL | Status: AC
  Administered 2020-07-17: 0.8 mg via SUBLINGUAL

## 2020-07-17 NOTE — Telephone Encounter (Signed)
Pt calling about if she should resume taking BP meds today. I instructed her that metoprolol is metabolized about 6 hr after ingestion. She should resume BP meds this evening per usual dosing.  Rockwell Alexandria RN Navigator Cardiac Imaging [facility] Services [phone] Office  [phone] Cell

## 2020-07-17 NOTE — Telephone Encounter (Signed)
The patient has been notified of the result and verbalized understanding.  All questions (if any) were answered. Theresia Majors, RN 07/17/2020 4:00 PM  Patient does not have recent FLP - she will come in next week for lab work.

## 2020-07-17 NOTE — Progress Notes (Signed)
Patient tolerated CT well. Drank water and ate cookies after. Ambulated to exit steady gait.  

## 2020-07-17 NOTE — Telephone Encounter (Signed)
-----   Message from Quintella Reichert, MD sent at 07/17/2020  3:33 PM EDT ----- Coronary CTA showed minimal Plaque and mildly increased Ca score.  Please get a copy of FLP from PCP.

## 2020-07-21 ENCOUNTER — Other Ambulatory Visit: Payer: Self-pay

## 2020-07-21 ENCOUNTER — Other Ambulatory Visit: Payer: Medicaid Other

## 2020-07-21 DIAGNOSIS — R079 Chest pain, unspecified: Secondary | ICD-10-CM

## 2020-07-21 LAB — LIPID PANEL
Chol/HDL Ratio: 3.8 ratio (ref 0.0–4.4)
Cholesterol, Total: 184 mg/dL (ref 100–199)
HDL: 49 mg/dL (ref >39)
LDL Chol Calc (NIH): 110 mg/dL — ABNORMAL HIGH (ref 0–99)
Triglycerides: 143 mg/dL (ref 0–149)
VLDL Cholesterol Cal: 25 mg/dL (ref 5–40)

## 2020-07-22 ENCOUNTER — Telehealth: Payer: Self-pay

## 2020-07-22 DIAGNOSIS — R079 Chest pain, unspecified: Secondary | ICD-10-CM

## 2020-07-22 MED ORDER — ATORVASTATIN CALCIUM 20 MG PO TABS: 20.0000 mg | ORAL_TABLET | Freq: Every day | ORAL | 3 refills | Status: DC

## 2020-07-22 NOTE — Telephone Encounter (Signed)
The patient has been notified of the result and verbalized understanding.  All questions (if any) were answered. Theresia Majors, RN 07/22/2020 11:16 AM  Patient educated on low cholesterol diet and exercise to help with lowering cholesterol. Will mail her information on heart healthy diet per request. Patient hesitant but agreeable to start on Lipitor 20 mg daily. She is scheduled to come back for repeat labs in 6 weeks.

## 2020-07-22 NOTE — Telephone Encounter (Signed)
-----   Message from Quintella Reichert, MD sent at 07/21/2020  4:47 PM EDT ----- LDL goal < 70 due to elevated coronary Ca score - start Lipitor 20mg  daily and repeat FLP and ALT in 6 weeks

## 2020-09-03 ENCOUNTER — Other Ambulatory Visit: Payer: Self-pay

## 2020-09-03 ENCOUNTER — Other Ambulatory Visit: Payer: Medicaid Other | Admitting: *Deleted

## 2020-09-03 DIAGNOSIS — R079 Chest pain, unspecified: Secondary | ICD-10-CM

## 2020-09-03 LAB — ALT: ALT: 24 IU/L (ref 0–32)

## 2020-09-03 LAB — LIPID PANEL
Chol/HDL Ratio: 2.3 ratio (ref 0.0–4.4)
Cholesterol, Total: 124 mg/dL (ref 100–199)
HDL: 54 mg/dL (ref >39)
LDL Chol Calc (NIH): 58 mg/dL (ref 0–99)
Triglycerides: 52 mg/dL (ref 0–149)
VLDL Cholesterol Cal: 12 mg/dL (ref 5–40)

## 2021-05-06 ENCOUNTER — Other Ambulatory Visit: Payer: Self-pay | Admitting: Family Medicine

## 2021-05-06 DIAGNOSIS — Z1231 Encounter for screening mammogram for malignant neoplasm of breast: Secondary | ICD-10-CM

## 2021-05-28 ENCOUNTER — Ambulatory Visit
Admission: RE | Admit: 2021-05-28 | Discharge: 2021-05-28 | Disposition: A | Discharge status: Home / Self Care | Payer: Medicaid Other | Source: Ambulatory Visit | Attending: Family Medicine | Admitting: Family Medicine

## 2021-05-28 ENCOUNTER — Other Ambulatory Visit: Payer: Self-pay

## 2021-05-28 DIAGNOSIS — Z1231 Encounter for screening mammogram for malignant neoplasm of breast: Secondary | ICD-10-CM

## 2021-06-05 ENCOUNTER — Other Ambulatory Visit: Payer: Self-pay | Admitting: Cardiology

## 2021-06-30 ENCOUNTER — Ambulatory Visit: Payer: Medicaid Other

## 2022-03-09 ENCOUNTER — Encounter: Payer: Self-pay | Admitting: *Deleted

## 2022-03-10 ENCOUNTER — Ambulatory Visit: Payer: Medicaid Other | Admitting: Psychiatry

## 2022-03-10 ENCOUNTER — Encounter: Payer: Self-pay | Admitting: Psychiatry

## 2022-03-10 VITALS — BP 113/73 | HR 75 | Ht 59.0 in | Wt 235.0 lb

## 2022-03-10 DIAGNOSIS — G43119 Migraine with aura, intractable, without status migrainosus: Secondary | ICD-10-CM

## 2022-03-10 DIAGNOSIS — G932 Benign intracranial hypertension: Secondary | ICD-10-CM | POA: Diagnosis not present

## 2022-03-10 MED ORDER — AIMOVIG 140 MG/ML ~~LOC~~ SOAJ
140.0000 mg | SUBCUTANEOUS | 11 refills | Status: DC
Start: 2022-03-10 — End: 2022-09-22

## 2022-03-10 MED ORDER — BACLOFEN 10 MG PO TABS: 10.0000 mg | ORAL_TABLET | Freq: Three times a day (TID) | ORAL | 11 refills | Status: DC

## 2022-03-10 MED ORDER — METHYLPREDNISOLONE 4 MG PO TBPK: ORAL_TABLET | ORAL | 0 refills | Status: DC

## 2022-03-10 MED ORDER — UBRELVY 100 MG PO TABS
100.0000 mg | ORAL_TABLET | ORAL | 0 refills | Status: DC | PRN
Start: 2022-03-10 — End: 2023-02-23

## 2022-03-10 MED ORDER — TOPIRAMATE 25 MG PO CPSP: 75.0000 mg | ORAL_CAPSULE | Freq: Every day | ORAL | 11 refills | Status: DC

## 2022-03-10 NOTE — Progress Notes (Signed)
? ?Referring:  ?Elouise Munroe, MD ?[address] ?[address] ?[place],  [place] [address] ? ?PCP: ?Macy Mis, MD ? ?Neurology was asked to evaluate Linda Cunningham, a 56 year old female for a chief complaint of headaches.  Our recommendations of care will be communicated by shared medical record.   ? ?CC:  headaches ? ?History provided from self ? ?HPI:  ?Medical co-morbidities: IIH, migraines, OSA, HTN, HLD ? ?The patient presents for evaluation of headaches which began several years ago. She has a history of migraine and was also diagnosed with IIH in 2014 (opening pressure 26). Last eye exam in June 2022 was normal. She plans to see ophthalmology next in July of this year. Currently she has 2-3 headaches per month. They are described as bitemporal pressure with associated photophobia and phonophobia. They can last for several hours at a time. She takes baclofen 10 mg as needed for migraines. Was told to avoid triptans by her Cardiologist due to HTN. She has previously tried Nurtec which was ineffective. ? ?She currently takes Aimovig 140 mg monthly for migraine prevention. She is also prescribed Topamax but frequently forgets to take it. It does cause paresthesias.  ? ?She is going back to school and has a lot of stress in her life. ? ?Headache History: ?Onset: several years ago ?Triggers: stress ?Aura: black floaters ?Location: temples, front, occiput ?Quality/Description: pressure ?Associated Symptoms: ? Photophobia: yes ? Phonophobia: yes ? Nausea: no ?Worse with activity?: yes ?Duration of headaches: several hours ? ?Headache days per month: 2 ?Headache free days per month: 28 ? ?Current Treatment: ?Abortive ?Baclofen ?Tylenol ? ?Preventative ?Aimovig 140 monthly ?Topamax 75 mg QHS ? ?Prior Therapies                                 ?Aimovig 140 mg monthly ?Baclofen 10 mg TID ?Topamax 75 mg QHS ?Maxalt 10 mg PRN ?Nurtec ? ?LABS: ?CBC ?   ?Component Value Date/Time  ? WBC 5.3 10/11/2016 1422  ? RBC  4.40 10/11/2016 1422  ? HGB 13.1 10/11/2016 1422  ? HCT 40.5 10/11/2016 1422  ? PLT 276 10/11/2016 1422  ? MCV 92.0 10/11/2016 1422  ? MCH 29.8 10/11/2016 1422  ? MCHC 32.3 10/11/2016 1422  ? RDW 12.7 10/11/2016 1422  ? LYMPHSABS 1.7 10/11/2016 1422  ? MONOABS 0.3 10/11/2016 1422  ? EOSABS 0.1 10/11/2016 1422  ? BASOSABS 0.0 10/11/2016 1422  ? ? ? ?IMAGING:  ?CTH 2012 unremarkable ? ?Imaging independently reviewed on Mar 10, 2022  ? ?Current Outpatient Medications on File Prior to Visit  ?Medication Sig Dispense Refill  ? acetaminophen (TYLENOL) 325 MG tablet Take 650 mg by mouth every 6 (six) hours as needed.    ? amLODipine (NORVASC) 5 MG tablet Take 5 mg by mouth every morning.     ? atorvastatin (LIPITOR) 20 MG tablet Take 1 tablet by mouth once daily 90 tablet 0  ? cetirizine (ZYRTEC) 10 MG tablet Take 10 mg by mouth daily.    ? diclofenac Sodium (VOLTAREN) 1 % GEL Apply 2 g topically 4 (four) times daily as needed.     ? famotidine (PEPCID) 20 MG tablet Take 20 mg by mouth daily as needed for heartburn or indigestion.     ? fluticasone (FLONASE) 50 MCG/ACT nasal spray Place 1 spray into the nose daily as needed for allergies.    ? hydrocortisone 2.5 % cream Apply 1  application topically 2 (two) times daily as needed.     ? ketoconazole (NIZORAL) 2 % cream Apply 1 application topically 2 (two) times daily as needed.     ? Levonorgestrel (MIRENA, 52 MG, IU) by Intrauterine route.    ? losartan (COZAAR) 100 MG tablet Take 100 mg by mouth every morning.     ? montelukast (SINGULAIR) 10 MG tablet Take 1 tablet by mouth daily.    ? triamcinolone cream (KENALOG) 0.1 % APPLY TO AREA ON LEG TWICE DAILY UNTIL IMPROVED    ? metoprolol tartrate (LOPRESSOR) 100 MG tablet Take 1 tablet (100 mg total) by mouth once for 1 dose. Take 1 tablet (100 mg total) two hours prior to CT scan. 1 tablet 0  ? pantoprazole (PROTONIX) 40 MG tablet Take 1 tablet by mouth 2 (two) times daily.  (Patient not taking: Reported on 03/10/2022)     ? ?No current facility-administered medications on file prior to visit.  ? ? ? ?Allergies: ?Allergies  ?Allergen Reactions  ? Amlodipine Besy-Benazepril Hcl Swelling  ?  Lips swell ?Lips swell ?Lips swell  ? Dust Mite Extract Other (See Comments)  ?  Sneezing   ? Other Rash  ? Amlodipine Besy-Benazepril Hcl Swelling  ?  LIP swelling  ? Lactose Intolerance (Gi)   ? Tilactase Diarrhea  ? ? ?Family History: ?Migraine or other headaches in the family:  sisters ?Aneurysms in a first degree relative:  none ?Brain tumors in the family:  none ?Other neurological illness in the family:   IIH ? ?Past Medical History: ?Past Medical History:  ?Diagnosis Date  ? Abnormal nuclear stress test 09/25/2015  ? Chest pain 01/05/2015  ? Epigastric pain   ? Essential hypertension, benign 12/11/2013  ? Hypertension   ? Migraine   ? OSA on CPAP   ? Followed by Dr. Catalina Lunger with [facility]  ? Pain of upper extremity 01/07/2020  ? Pseudotumor cerebri   ? SOB (shortness of breath) 09/16/2016  ? ? ?Past Surgical History ?Past Surgical History:  ?Procedure Laterality Date  ? CARDIAC CATHETERIZATION N/A 09/25/2015  ? Procedure: Left Heart Cath and Coronary Angiography;  Surgeon: Peter M Swaziland, MD;  Location: [facility] INVASIVE CV LAB;  Service: Cardiovascular;  Laterality: N/A;  ? CESAREAN SECTION    ? ? ?Social History: ?Social History  ? ?Tobacco Use  ? Smoking status: Never  ? Smokeless tobacco: Never  ?Substance Use Topics  ? Alcohol use: No  ? Drug use: No  ? ? ?ROS: ?Negative for fevers, chills. Positive for headaches. All other systems reviewed and negative unless stated otherwise in HPI. ? ? ?Physical Exam:  ? ?Vital Signs: ?BP 113/73   Pulse 75   Ht 4\' 11"  (1.499 m)   Wt 235 lb (106.6 kg)   BMI 47.46 kg/m?  ?GENERAL: well appearing,in no acute distress,alert ?SKIN:  Color, texture, turgor normal. No rashes or lesions ?HEAD:  Normocephalic/atraumatic. ?CV:  RRR ?RESP: Normal respiratory effort ?MSK: +tenderness to palpation over right  occiput and neck ? ?NEUROLOGICAL: ?Mental Status: Alert, oriented to person, place and time,Follows commands ?Cranial Nerves: PERRL, no papilledema visualized, visual fields intact to confrontation, extraocular movements intact, facial sensation intact, no facial droop or ptosis, hearing grossly intact, no dysarthria ?Motor: muscle strength 5/5 both upper and lower extremities,no drift, normal tone ?Reflexes: 2+ throughout ?Sensation: intact to light touch all 4 extremities ?Coordination: Finger-to- nose-finger intact bilaterally ? ?IMPRESSION: ?56 year old female with a history of IIH, migraines, OSA, HTN,  HLD who presents for evaluation of migraines and IIH. Will continue Aimovig and Topamax for now. No papilledema visualized today, if she does not have papilledema on her eye exam in July may consider weaning off of Topamax as it causes bothersome paresthesias. Baclofen only helps somewhat but does not fully relieve her headache. She was told to avoid triptans due to HTN from her Cardiologist. Bernita RaisinUbrelvy sample provided, if this is effective will send in a prescription. ? ?PLAN: ?-Prevention: Continue Aimovig 140 mg monthly, Topamax 75 mg QHS ?-Rescue: Continue baclofen 10 mg TID PRN. Ubrelvy sample provided, if effective will send in a prescription ?-Medrol dosepak to help break current headache cycle ?-Next steps: Consider weaning Topamax if eye exam in July is negative for papilledema ? ? ?I spent a total of 41 minutes chart reviewing and counseling the patient. Headache education was done. Discussed treatment options including preventive and acute medications. Discussed medication side effects, adverse reactions and drug interactions. Written educational materials and patient instructions outlining all of the above were given. ? ?Follow-up: 6 months ? ? ?Ocie DoyneJennifer Marshell Rieger, MD ?03/10/2022   ?11:13 AM ? ? ?

## 2022-03-10 NOTE — Patient Instructions (Addendum)
Try Bernita Raisin as needed for migraine. Take one pill at the onset of migraine. Can repeat a dose in 2 hours if headache persists. ?Steroid pack to help with current migraine cycle ?Can take an over the counter potassium supplement (99 mg) daily to help with tingling ?

## 2022-04-20 ENCOUNTER — Other Ambulatory Visit: Payer: Self-pay | Admitting: Family Medicine

## 2022-04-20 DIAGNOSIS — Z1231 Encounter for screening mammogram for malignant neoplasm of breast: Secondary | ICD-10-CM

## 2022-04-21 ENCOUNTER — Other Ambulatory Visit: Payer: Self-pay | Admitting: Cardiology

## 2022-04-28 ENCOUNTER — Ambulatory Visit: Payer: Medicaid Other | Attending: Family Medicine

## 2022-04-28 ENCOUNTER — Other Ambulatory Visit: Payer: Self-pay

## 2022-04-28 DIAGNOSIS — R293 Abnormal posture: Secondary | ICD-10-CM | POA: Insufficient documentation

## 2022-04-28 DIAGNOSIS — M6281 Muscle weakness (generalized): Secondary | ICD-10-CM | POA: Insufficient documentation

## 2022-04-28 NOTE — Therapy (Signed)
OUTPATIENT PHYSICAL THERAPY FEMALE PELVIC EVALUATION   Patient Name: Linda Cunningham MRN: [id number] DOB:1966-09-15, 56 y.o., female Today's Date: 04/28/2022   PT End of Session - 04/28/22 0928     Visit Number 1    Date for PT Re-Evaluation 07/21/22    Authorization Type [employer] Medicaid    PT Start Time 0928    PT Stop Time 1015    PT Time Calculation (min) 47 min    Activity Tolerance Patient tolerated treatment well    Behavior During Therapy [facility] for tasks assessed/performed             Past Medical History:  Diagnosis Date   Abnormal nuclear stress test 09/25/2015   Chest pain 01/05/2015   Epigastric pain    Essential hypertension, benign 12/11/2013   Hypertension    Migraine    OSA on CPAP    Followed by Dr. Catalina Lunger with [facility]   Pain of upper extremity 01/07/2020   Pseudotumor cerebri    SOB (shortness of breath) 09/16/2016   Past Surgical History:  Procedure Laterality Date   CARDIAC CATHETERIZATION N/A 09/25/2015   Procedure: Left Heart Cath and Coronary Angiography;  Surgeon: Peter M Swaziland, MD;  Location: [facility] INVASIVE CV LAB;  Service: Cardiovascular;  Laterality: N/A;   CESAREAN SECTION     Patient Active Problem List   Diagnosis Date Noted   Pain of upper extremity 01/07/2020   SOB (shortness of breath) 09/16/2016   OSA on CPAP    Abnormal nuclear stress test 09/25/2015   Chest pain 01/05/2015   Essential hypertension, benign 12/11/2013    PCP: Macy Mis, MD  REFERRING PROVIDER: Macy Mis, MD  REFERRING DIAG:  R35.0 (ICD-10-CM) - Frequency of micturition  R35.0 (ICD-10-CM) - Urinary frequency    THERAPY DIAG:  Abnormal posture  Muscle weakness (generalized)  Rationale for Evaluation and Treatment Rehabilitation  ONSET DATE: 10/07/2022  SUBJECTIVE:                                                                                                                                                                                            SUBJECTIVE STATEMENT: Patient states that she has noticed that she is not holding urine as well, especially at night. She will wake up and bed is wet.The nocturnal enuresis has only happened 3x and seems related to drinking smoothies before bed. She is also having urgency and urge incontinence.    Fluid intake: Yes: She does not drink much water - a bottle to a bottle and a half a day; she does tea and juice; no soda or alcohol; occasional  hot tea     PAIN:  Are you having pain? No  PRECAUTIONS: None  WEIGHT BEARING RESTRICTIONS No  FALLS:  Has patient fallen in last 6 months? No  LIVING ENVIRONMENT: Lives with: lives with their family Lives in: House/apartment  OCCUPATION: not currently employed  PLOF: Independent  PATIENT GOALS decrease urinary leaking  PERTINENT HISTORY:  One c-section, bil knee pain Sexual abuse: No  BOWEL MOVEMENT Pain with bowel movement: No Type of bowel movement:Frequency every other day or two days missed and Strain Yes  Fully empty rectum: No Leakage: No Pads: No Fiber supplement: No  URINATION Pain with urination: No Fully empty bladder: No -  Stream:  WNL Urgency: Yes: when she tries to hold  Frequency: typically every hour Leakage: Urge to void, Walking to the bathroom, and then couple times during the night Pads: No - not helpful at night due to amount; none during the day  INTERCOURSE Pain with intercourse:  not painful, but dry Ability to have vaginal penetration:  Yes: - Climax: yes - does not do very often *feels like she has issue with itchiness - MD has ruled out yeast and other infections - she is being told that there is nothing wrong    PREGNANCY Vaginal deliveries 0 Tearing No C-section deliveries 1 Currently pregnant No  PROLAPSE None    OBJECTIVE:   PATIENT SURVEYS:   PFIQ-7 - 52  COGNITION:  Overall cognitive status: Within functional limits for tasks assessed     SENSATION:  Light touch:  Appears intact  Proprioception: Appears intact  GAIT:  Comments: antalgic gait pattern, SPC Rt LE               POSTURE: rounded shoulders and forward head    PALPATION:   General  No abdominal tenderness                External Perineal Exam WNL                             Internal Pelvic Floor No tenderness in pelvic floor  Patient confirms identification and approves PT to assess internal pelvic floor and treatment Yes  PELVIC MMT: Pelvic floor strength 3/5 Endurance 5 seconds Repeat contractions 13x        TONE: WNL  PROLAPSE: Grade 1 anterior vaignal wall laxity  TODAY'S TREATMENT 04/28/22 EVAL  Neuromuscular re-education: Pelvic floor contraction training: Quick flicks 10x Long holds 2 x 10 seconds Urge suppression technique quick flicks Self-care: Double voiding Squatty potty/relaxed toileting mechanics Vulvovaginal massage Regular orgasm/vibrator Urge suppression technique  Check all possible CPT codes: 16073- Neuro Re-education and [phone] - Self Care     If treatment provided at initial evaluation, no treatment charged due to lack of authorization.       PATIENT EDUCATION:  Education details: see above self-care Person educated: Patient Education method: Explanation, Demonstration, Tactile cues, Verbal cues, and Handouts Education comprehension: verbalized understanding   HOME EXERCISE PROGRAM: [id number]  ASSESSMENT:  CLINICAL IMPRESSION: Patient is a 56 y.o. female who was seen today for physical therapy evaluation and treatment for nocturnal enuresis, urinary frequency, and urgency/urge incontinence. Exam findings notable for pelvic floor weakness 3/5, endurance 5 seconds, 13 repetitions, and grade 1 anterior vaginal wall laxity. Signs and symptoms are most consistent with poor bladder/hydration habits and pelvic floor weakness. Initial treatment included lifestyle modifications, toileting mechanics, and initial HEP to focus on pelvic floor  strengthening  and urge suppression technique. She will benefit from skilled PT intervention in order to decrease nocturnal enuresis, improve urgency/incontinence, and improve QOL.    OBJECTIVE IMPAIRMENTS decreased activity tolerance, decreased coordination, decreased endurance, decreased strength, increased fascial restrictions, increased muscle spasms, postural dysfunction, and pain.   ACTIVITY LIMITATIONS continence  PARTICIPATION LIMITATIONS: interpersonal relationship and community activity  PERSONAL FACTORS 1 comorbidity: c-section  are also affecting patient's functional outcome.   REHAB POTENTIAL: Good  CLINICAL DECISION MAKING: Stable/uncomplicated  EVALUATION COMPLEXITY: Low   GOALS: Goals reviewed with patient? Yes  SHORT TERM GOALS: Target date: 05/26/2022  Pt will be independent with HEP.   Baseline: Goal status: INITIAL  2.  Pt will be independent with the knack, urge suppression technique, and double voiding in order to improve bladder habits and decrease urinary incontinence.   Baseline:  Goal status: INITIAL  3.  Pt will be independent with use of squatty potty, relaxed toileting mechanics, and improved bowel movement techniques in order to increase ease of bowel movements and complete evacuation.   Baseline:  Goal status: INITIAL   LONG TERM GOALS: Target date: 07/21/2022   Pt will be independent with advanced HEP.   Baseline:  Goal status: INITIAL  2.  Pt will demonstrate normal pelvic floor muscle tone and A/ROM, able to achieve 4/5 strength with contractions and 10 sec endurance, in order to provide appropriate lumbopelvic support in functional activities.   Baseline:  Goal status: INITIAL  3.  Pt will report no episodes of urinary incontinence in order to improve sleeping, confidence in community activities and personal hygiene.   Baseline:  Goal status: INITIAL  4.  Pt will be able to go 2-3 hours in between voids without urgency or  incontinence in order to improve QOL and perform all functional activities with less difficulty.   Baseline:  Goal status: INITIAL   PLAN: PT FREQUENCY: 1x/week  PT DURATION: 12 weeks  PLANNED INTERVENTIONS: Therapeutic exercises, Therapeutic activity, Neuromuscular re-education, Balance training, Gait training, Patient/Family education, Joint mobilization, Dry Needling, Biofeedback, and Manual therapy  PLAN FOR NEXT SESSION: Begin core training; review lifestyle modifications; strict bladder retraining.   Julio Alm, PT, DPT06/22/232:54 PM

## 2022-04-28 NOTE — Patient Instructions (Addendum)
Vulvar/vaginal Massage: This is a technique to help decrease painful sensitivity in the vaginal area. It can also help to restore normal moisture levels in the vaginal tissues. With coconut oil, aloe, jojoba oil, or a specific vaginal moisturizer, gently massage into vaginal tissues. Think of this as part of your post-shower routine and moisturizing just like you would the rest of the body with lotion. This helps to increase good blood flow to the vaginal tissues. In addition, it also teaches the body that touch to the vagina does not have to be painful or threatening, but moisturizing and gentle. *Try not to use soap on inner labia and vagina  Squatty potty: When your knees are level or below the level of your hips, pelvic floor muscles are pressed against rectum, preventing ease of bowel movement. By getting knees above the level of the hips, these pelvic floor muscles relax, allowing easier passage of bowel movement. ? Ways to get knees above hips: o Squatty Potty (7inch and 9inch versions) o Small stool o Roll of toilet paper under each foot o Hardback book or stack of magazines under each foot  Relaxed Toileting mechanics: Once in this position, make sure to lean forward with forearms on thighs, wide knees, relaxed stomach, and breathe.   Double-voiding:  This technique is to help with post-void dribbling, or leaking a little bit when you stand up right after urinating.  Use relaxed toileting mechanics to urinate as much as you feel like you have to without straining.  Sit back upright from leaning forward and relax this way for 10-20 seconds.  Lean forward again to finish voiding any amount more.   Vibrator: AVA - on [employer]. Having regular orgasms is good for vaginal health.   Urge suppression technique: A technique to help you hold urine until it's an appropriate time to go, whether this is making it home or trying to reach a specific voiding time frame according  to your schedule. It helps to send signals from the bladder to the brain that say you don't actually have to void urine right now. This most likely only give you several minutes of relief at first, but repeat as needed; the benefit will last longer as you use this technique more and get into better bladder habits. ?  The technique: o Perform 5 quick flicks (Kegels) rapidly, not worrying about fully relaxing in between each (only in this technique). o Then perform several deep belly breaths while focusing on relaxing the pelvic floor. o Go do something else to help distract yourself from the urge to urinate. o Repeat as needed.    [facility] [address], [address] [place], [place] [address] Phone # [phone] Fax [phone]

## 2022-05-03 ENCOUNTER — Ambulatory Visit: Payer: Medicaid Other

## 2022-05-03 DIAGNOSIS — M6281 Muscle weakness (generalized): Secondary | ICD-10-CM

## 2022-05-03 DIAGNOSIS — R293 Abnormal posture: Secondary | ICD-10-CM | POA: Diagnosis not present

## 2022-05-18 ENCOUNTER — Ambulatory Visit: Payer: Medicaid Other | Attending: Family Medicine

## 2022-05-18 ENCOUNTER — Other Ambulatory Visit: Payer: Self-pay | Admitting: Cardiology

## 2022-05-18 DIAGNOSIS — M6281 Muscle weakness (generalized): Secondary | ICD-10-CM | POA: Insufficient documentation

## 2022-05-18 DIAGNOSIS — R293 Abnormal posture: Secondary | ICD-10-CM | POA: Diagnosis not present

## 2022-05-18 NOTE — Therapy (Signed)
OUTPATIENT PHYSICAL THERAPY TREATMENT NOTE   Patient Name: Linda Cunningham MRN: [id number] DOB:Oct 23, 1966, 56 y.o., female Today's Date: 05/18/2022  PCP: Macy Mis, MD REFERRING PROVIDER: Macy Mis, MD  END OF SESSION:   PT End of Session - 05/18/22 1021     Visit Number 3    Date for PT Re-Evaluation 07/21/22    Authorization Type [employer] Medicaid    PT Start Time 1019    PT Stop Time 1057    PT Time Calculation (min) 38 min              Past Medical History:  Diagnosis Date   Abnormal nuclear stress test 09/25/2015   Chest pain 01/05/2015   Epigastric pain    Essential hypertension, benign 12/11/2013   Hypertension    Migraine    OSA on CPAP    Followed by Dr. Catalina Lunger with [facility]   Pain of upper extremity 01/07/2020   Pseudotumor cerebri    SOB (shortness of breath) 09/16/2016   Past Surgical History:  Procedure Laterality Date   CARDIAC CATHETERIZATION N/A 09/25/2015   Procedure: Left Heart Cath and Coronary Angiography;  Surgeon: Peter M Swaziland, MD;  Location: [facility] INVASIVE CV LAB;  Service: Cardiovascular;  Laterality: N/A;   CESAREAN SECTION     Patient Active Problem List   Diagnosis Date Noted   Pain of upper extremity 01/07/2020   SOB (shortness of breath) 09/16/2016   OSA on CPAP    Abnormal nuclear stress test 09/25/2015   Chest pain 01/05/2015   Essential hypertension, benign 12/11/2013    REFERRING DIAG: R35.0 (ICD-10-CM) - Frequency of micturition R35.0 (ICD-10-CM) - Urinary frequency  THERAPY DIAG:  Abnormal posture  Muscle weakness (generalized)  Rationale for Evaluation and Treatment Rehabilitation  PERTINENT HISTORY: One c-section, bil knee pain  PRECAUTIONS: NA  SUBJECTIVE: Patient states that she has not had any leaking, but she is still having a lot of urgency. She is working on trying to increase water intake, but is only drinking about a bottle and a half of water throughout the day. She has not had intercourse,  but she has been working on vulvovaginal massage and attempting orgasm on her own.   PAIN:  Are you having pain? No   SUBJECTIVE:                                                                                                                                                                                            SUBJECTIVE STATEMENT: Patient states that she has noticed that she is not holding urine as well, especially at night. She will  wake up and bed is wet.The nocturnal enuresis has only happened 3x and seems related to drinking smoothies before bed. She is also having urgency and urge incontinence.     Fluid intake: Yes: She does not drink much water - a bottle to a bottle and a half a day; she does tea and juice; no soda or alcohol; occasional hot tea       PAIN:  Are you having pain? No   PRECAUTIONS: None   WEIGHT BEARING RESTRICTIONS No   FALLS:  Has patient fallen in last 6 months? No   LIVING ENVIRONMENT: Lives with: lives with their family Lives in: House/apartment   OCCUPATION: not currently employed   PLOF: Independent   PATIENT GOALS decrease urinary leaking   PERTINENT HISTORY:  One c-section, bil knee pain Sexual abuse: No   BOWEL MOVEMENT Pain with bowel movement: No Type of bowel movement:Frequency every other day or two days missed and Strain Yes  Fully empty rectum: No Leakage: No Pads: No Fiber supplement: No   URINATION Pain with urination: No Fully empty bladder: No -  Stream:  WNL Urgency: Yes: when she tries to hold  Frequency: typically every hour Leakage: Urge to void, Walking to the bathroom, and then couple times during the night Pads: No - not helpful at night due to amount; none during the day   INTERCOURSE Pain with intercourse:  not painful, but dry Ability to have vaginal penetration:  Yes: - Climax: yes - does not do very often *feels like she has issue with itchiness - MD has ruled out yeast and other infections - she  is being told that there is nothing wrong      PREGNANCY Vaginal deliveries 0 Tearing No C-section deliveries 1 Currently pregnant No   PROLAPSE None       OBJECTIVE:    PATIENT SURVEYS:    PFIQ-7 - 52   COGNITION:            Overall cognitive status: Within functional limits for tasks assessed                          SENSATION:            Light touch: Appears intact            Proprioception: Appears intact   GAIT:   Comments: antalgic gait pattern, SPC Rt LE                POSTURE: rounded shoulders and forward head      PALPATION:   General  No abdominal tenderness                 External Perineal Exam WNL                             Internal Pelvic Floor No tenderness in pelvic floor   Patient confirms identification and approves PT to assess internal pelvic floor and treatment Yes   PELVIC MMT: Pelvic floor strength 3/5 Endurance 5 seconds Repeat contractions 13x         TONE: WNL   PROLAPSE: Grade 1 anterior vaignal wall laxity   TODAY'S TREATMENT 05/18/22 Neuromuscular re-education: Core facilitation: Seated march 3 x 10 Band pull down green 2 x 10 Exercises: Strengthening: Seated clam shell 3 x 10 green band Seated hip adduction isometric, 2 x 10, 5 seconds Self-care: Working on increasing  fluid intake that does not have caffeine Strict bladder retraining in detail  TREATMENT 05/03/22 Neuromuscular re-education: Core retraining:  Transversus abdominus training with multimodal cues for improved motor control and breath coordination Core facilitation: Supine march Exercises: Strengthening: Bridge with hip adduction 2 x 10 Clam shells 15x bil Self-care: Lubricants - samples given Review of vulvovaginal massage and where to put coconut oil Discussed the benefits of orgasm Reviewed urge suppression technique vs pelvic floor strengthening program   TREATMENT 04/28/22 EVAL  Neuromuscular re-education: Pelvic floor contraction  training: Quick flicks 10x Long holds 2 x 10 seconds Urge suppression technique quick flicks Self-care: Double voiding Squatty potty/relaxed toileting mechanics Vulvovaginal massage Regular orgasm/vibrator Urge suppression technique   Check all possible CPT codes: 44034- Neuro Re-education and [phone] - Self Care                                         If treatment provided at initial evaluation, no treatment charged due to lack of authorization.                                PATIENT EDUCATION:  Education details: see above self-care; HEP progressions Person educated: Patient Education method: Explanation, Demonstration, Tactile cues, Verbal cues, and Handouts Education comprehension: verbalized understanding     HOME EXERCISE PROGRAM: [id number]   ASSESSMENT:   CLINICAL IMPRESSION: Pt is seeing some great progress demonstrated by no episodes of urinary leaking. However, she is still having significant urgency and is going to the bathroom about every hour. To help reduce this, we discussed strict bladder retraining with goal voiding window right now being 1.5 hours. We reviewed the urge suppression technique and distraction as important aspects of trying to meet her voiding schedule. Core/pelvic floor exercises progressed to include seated and standing exercises today with good tolerance and appropriate challenge. HEP updated. We also reviewed importance of healthy water/fluid intake. She will benefit from skilled PT intervention in order to decrease nocturnal enuresis, improve urgency/incontinence, and improve QOL.      OBJECTIVE IMPAIRMENTS decreased activity tolerance, decreased coordination, decreased endurance, decreased strength, increased fascial restrictions, increased muscle spasms, postural dysfunction, and pain.    ACTIVITY LIMITATIONS continence   PARTICIPATION LIMITATIONS: interpersonal relationship and community activity   PERSONAL FACTORS 1 comorbidity: c-section   are also affecting patient's functional outcome.    REHAB POTENTIAL: Good   CLINICAL DECISION MAKING: Stable/uncomplicated   EVALUATION COMPLEXITY: Low     GOALS: Goals reviewed with patient? Yes   SHORT TERM GOALS: Target date: 05/26/2022   Pt will be independent with HEP.    Baseline: Goal status: INITIAL   2.  Pt will be independent with the knack, urge suppression technique, and double voiding in order to improve bladder habits and decrease urinary incontinence.    Baseline:  Goal status: INITIAL   3.  Pt will be independent with use of squatty potty, relaxed toileting mechanics, and improved bowel movement techniques in order to increase ease of bowel movements and complete evacuation.    Baseline:  Goal status: INITIAL     LONG TERM GOALS: Target date: 07/21/2022    Pt will be independent with advanced HEP.    Baseline:  Goal status: INITIAL   2.  Pt will demonstrate normal pelvic floor muscle tone and A/ROM, able to achieve 4/5 strength  with contractions and 10 sec endurance, in order to provide appropriate lumbopelvic support in functional activities.    Baseline:  Goal status: INITIAL   3.  Pt will report no episodes of urinary incontinence in order to improve sleeping, confidence in community activities and personal hygiene.    Baseline:  Goal status: INITIAL   4.  Pt will be able to go 2-3 hours in between voids without urgency or incontinence in order to improve QOL and perform all functional activities with less difficulty.    Baseline:  Goal status: INITIAL     PLAN: PT FREQUENCY: 1x/week   PT DURATION: 12 weeks   PLANNED INTERVENTIONS: Therapeutic exercises, Therapeutic activity, Neuromuscular re-education, Balance training, Gait training, Patient/Family education, Joint mobilization, Dry Needling, Biofeedback, and Manual therapy   PLAN FOR NEXT SESSION: Core/pelvic floor progressions.      Julio Alm, PT, DPT07/10/2309:57 AM

## 2022-05-18 NOTE — Telephone Encounter (Signed)
Pt has not been seen since 2021 and pt has an upcoming appt on 10/07/22 with Dr. Mayford Knife. Does pt need to be seen sooner, since pt has not been seen since 2021? Please address

## 2022-05-18 NOTE — Patient Instructions (Addendum)
Bladder retraining:  Drink 4-8oz an hour ONLY WATER (for [place], as long as as there's no carbonation/bubbles, no caffeine, or no alcohol it's ok) Stop water intake 3 hours before bed Try to go at least 1 and a half hours between trips to the bathroom; I want you to go whether you need to or not at this time. Use the urge suppression technique and distraction to help you achieve this.   For two weeks.   [facility] [address] [place], [place] [address] Phone # [phone] Fax [phone]

## 2022-05-31 ENCOUNTER — Ambulatory Visit
Admission: RE | Admit: 2022-05-31 | Discharge: 2022-05-31 | Disposition: A | Discharge status: Home / Self Care | Payer: Medicaid Other | Source: Ambulatory Visit | Attending: Family Medicine | Admitting: Family Medicine

## 2022-05-31 DIAGNOSIS — Z1231 Encounter for screening mammogram for malignant neoplasm of breast: Secondary | ICD-10-CM

## 2022-06-02 ENCOUNTER — Ambulatory Visit: Payer: Medicaid Other

## 2022-06-02 DIAGNOSIS — R293 Abnormal posture: Secondary | ICD-10-CM

## 2022-06-02 DIAGNOSIS — M6281 Muscle weakness (generalized): Secondary | ICD-10-CM

## 2022-06-02 NOTE — Therapy (Signed)
OUTPATIENT PHYSICAL THERAPY TREATMENT NOTE   Patient Name: Linda Cunningham MRN: [id number] DOB:[birth year], 56 y.o., female Today's Date: 06/02/2022  PCP: Macy Mis, MD REFERRING PROVIDER: Macy Mis, MD  END OF SESSION:   PT End of Session - 06/02/22 0934     Visit Number 4    Date for PT Re-Evaluation 07/21/22    Authorization Type [employer] Medicaid    PT Start Time [phone]    PT Stop Time 1012    PT Time Calculation (min) 38 min    Activity Tolerance Patient tolerated treatment well    Behavior During Therapy [facility] for tasks assessed/performed               Past Medical History:  Diagnosis Date   Abnormal nuclear stress test 09/25/2015   Chest pain 01/05/2015   Epigastric pain    Essential hypertension, benign 12/11/2013   Hypertension    Migraine    OSA on CPAP    Followed by Dr. Catalina Lunger with [facility]   Pain of upper extremity 01/07/2020   Pseudotumor cerebri    SOB (shortness of breath) 09/16/2016   Past Surgical History:  Procedure Laterality Date   CARDIAC CATHETERIZATION N/A 09/25/2015   Procedure: Left Heart Cath and Coronary Angiography;  Surgeon: Peter M Swaziland, MD;  Location: [facility] INVASIVE CV LAB;  Service: Cardiovascular;  Laterality: N/A;   CESAREAN SECTION     Patient Active Problem List   Diagnosis Date Noted   Pain of upper extremity 01/07/2020   SOB (shortness of breath) 09/16/2016   OSA on CPAP    Abnormal nuclear stress test 09/25/2015   Chest pain 01/05/2015   Essential hypertension, benign 12/11/2013    REFERRING DIAG: R35.0 (ICD-10-CM) - Frequency of micturition R35.0 (ICD-10-CM) - Urinary frequency  THERAPY DIAG:  Abnormal posture  Muscle weakness (generalized)  Rationale for Evaluation and Treatment Rehabilitation  PERTINENT HISTORY: One c-section, bil knee pain  PRECAUTIONS: NA  SUBJECTIVE: Pt states that she was not as good about exercises while visiting family out of town over the last week. She states that she did  have intercourse and there was some discomfort - she feels like this was due to lack of lubrication and foreplay. She is working on urge suppression technique, but feels like it is difficult. She is working on Field seismologist intake - increased to 3 bottles a day.   PAIN:  Are you having pain? No   SUBJECTIVE:  SUBJECTIVE STATEMENT: Patient states that she has noticed that she is not holding urine as well, especially at night. She will wake up and bed is wet.The nocturnal enuresis has only happened 3x and seems related to drinking smoothies before bed. She is also having urgency and urge incontinence.     Fluid intake: Yes: She does not drink much water - a bottle to a bottle and a half a day; she does tea and juice; no soda or alcohol; occasional hot tea       PAIN:  Are you having pain? No   PRECAUTIONS: None   WEIGHT BEARING RESTRICTIONS No   FALLS:  Has patient fallen in last 6 months? No   LIVING ENVIRONMENT: Lives with: lives with their family Lives in: House/apartment   OCCUPATION: not currently employed   PLOF: Independent   PATIENT GOALS decrease urinary leaking   PERTINENT HISTORY:  One c-section, bil knee pain Sexual abuse: No   BOWEL MOVEMENT Pain with bowel movement: No Type of bowel movement:Frequency every other day or two days missed and Strain Yes  Fully empty rectum: No Leakage: No Pads: No Fiber supplement: No   URINATION Pain with urination: No Fully empty bladder: No -  Stream:  WNL Urgency: Yes: when she tries to hold  Frequency: typically every hour Leakage: Urge to void, Walking to the bathroom, and then couple times during the night Pads: No - not helpful at night due to amount; none during the day   INTERCOURSE Pain with intercourse:  not painful, but  dry Ability to have vaginal penetration:  Yes: - Climax: yes - does not do very often *feels like she has issue with itchiness - MD has ruled out yeast and other infections - she is being told that there is nothing wrong      PREGNANCY Vaginal deliveries 0 Tearing No C-section deliveries 1 Currently pregnant No   PROLAPSE None       OBJECTIVE:    PATIENT SURVEYS:    PFIQ-7 - 52   COGNITION:            Overall cognitive status: Within functional limits for tasks assessed                          SENSATION:            Light touch: Appears intact            Proprioception: Appears intact   GAIT:   Comments: antalgic gait pattern, SPC Rt LE                POSTURE: rounded shoulders and forward head      PALPATION:   General  No abdominal tenderness                 External Perineal Exam WNL                             Internal Pelvic Floor No tenderness in pelvic floor   Patient confirms identification and approves PT to assess internal pelvic floor and treatment Yes   PELVIC MMT: Pelvic floor strength 3/5 Endurance 5 seconds Repeat contractions 13x         TONE: WNL   PROLAPSE: Grade 1 anterior vaignal wall laxity   TODAY'S TREATMENT 06/02/22 Neuromuscular re-education: Core facilitation: Supine march 2 x 10 - form correction Leg extensions 10x bil - cues  for improved core facilitation Exercises: Strengthening: Bridge with ball squeeze 2 x 10 (core/pelvic floor contraction) Seated clam shells blue band 2 x 10 Seated hip IR red band and ball squeeze 2 x 10 Self-care: Reviewed lubricants and increasing foreplay prior to intimate activities Squatty potty options - helped research lower option  TREATMENT 05/18/22 Neuromuscular re-education: Core facilitation: Seated march 3 x 10 Band pull down green 2 x 10 Exercises: Strengthening: Seated clam shell 3 x 10 green band Seated hip adduction isometric, 2 x 10, 5 seconds Self-care: Working on  increasing fluid intake that does not have caffeine Strict bladder retraining in detail  TREATMENT 05/03/22 Neuromuscular re-education: Core retraining:  Transversus abdominus training with multimodal cues for improved motor control and breath coordination Core facilitation: Supine march Exercises: Strengthening: Bridge with hip adduction 2 x 10 Clam shells 15x bil Self-care: Lubricants - samples given Review of vulvovaginal massage and where to put coconut oil Discussed the benefits of orgasm Reviewed urge suppression technique vs pelvic floor strengthening program    PATIENT EDUCATION:  Education details: see above self-care; HEP progressions Person educated: Patient Education method: Explanation, Demonstration, Tactile cues, Verbal cues, and Handouts Education comprehension: verbalized understanding     HOME EXERCISE PROGRAM: [id number]   ASSESSMENT:   CLINICAL IMPRESSION: Pt has returned to intercourse for first time in a long time and had some deep dyspareunia. She was encouraged to use more lubricant and make sure she is engaging in foreplay activities to help improve arousal and vaginal moisture levels. We reviewed many exercises in HEP that she feels like she was doing incorrectly. We corrected form for several exercises and discussed what the purpose of these exercises is. She was able to progress exercises to include several new hip/core activities with good form that were added to HEP. We discussed breathing during all exercises for improved core/pelvic floor facilitation. benefit from skilled PT intervention in order to decrease nocturnal enuresis, improve urgency/incontinence, and improve QOL.      OBJECTIVE IMPAIRMENTS decreased activity tolerance, decreased coordination, decreased endurance, decreased strength, increased fascial restrictions, increased muscle spasms, postural dysfunction, and pain.    ACTIVITY LIMITATIONS continence   PARTICIPATION LIMITATIONS:  interpersonal relationship and community activity   PERSONAL FACTORS 1 comorbidity: c-section  are also affecting patient's functional outcome.    REHAB POTENTIAL: Good   CLINICAL DECISION MAKING: Stable/uncomplicated   EVALUATION COMPLEXITY: Low     GOALS: Goals reviewed with patient? Yes   SHORT TERM GOALS: Target date: 05/26/2022 - updated 06/02/22   Pt will be independent with HEP.    Baseline: Goal status: IN PROGRESS   2.  Pt will be independent with the knack, urge suppression technique, and double voiding in order to improve bladder habits and decrease urinary incontinence.    Baseline:  Goal status: IN PROGRESS   3.  Pt will be independent with use of squatty potty, relaxed toileting mechanics, and improved bowel movement techniques in order to increase ease of bowel movements and complete evacuation.    Baseline:  Goal status: INITIAL     LONG TERM GOALS: Target date: 07/21/2022 - updated 06/02/22   Pt will be independent with advanced HEP.    Baseline:  Goal status: IN PROGRESS   2.  Pt will demonstrate normal pelvic floor muscle tone and A/ROM, able to achieve 4/5 strength with contractions and 10 sec endurance, in order to provide appropriate lumbopelvic support in functional activities.    Baseline:  Goal status: IN PROGRESS  3.  Pt will report no episodes of urinary incontinence in order to improve sleeping, confidence in community activities and personal hygiene.    Baseline:  Goal status: IN PROGRESS   4.  Pt will be able to go 2-3 hours in between voids without urgency or incontinence in order to improve QOL and perform all functional activities with less difficulty.    Baseline:  Goal status: IN PROGRESS     PLAN: PT FREQUENCY: 1x/week   PT DURATION: 12 weeks   PLANNED INTERVENTIONS: Therapeutic exercises, Therapeutic activity, Neuromuscular re-education, Balance training, Gait training, Patient/Family education, Joint mobilization, Dry  Needling, Biofeedback, and Manual therapy   PLAN FOR NEXT SESSION: Core/pelvic floor progressions.      Julio Alm, PT, DPT07/27/2310:16 AM

## 2022-06-08 ENCOUNTER — Ambulatory Visit: Payer: Medicaid Other

## 2022-06-14 NOTE — Therapy (Unsigned)
OUTPATIENT PHYSICAL THERAPY TREATMENT NOTE   Patient Name: Linda Cunningham MRN: [id number] DOB:07/15/1966, 56 y.o., female, female Today's Date: 06/15/2022  PCP: Briscoe, Kim K, MD REFERRING PROVIDER: Briscoe, Kim K, MD  END OF SESSION:   PT End of Session - 06/15/22 1338     Visit Number 5    Date for PT Re-Evaluation 07/21/22    Authorization Type [employer]    PT Start Time 0937    PT Stop Time 1015    PT Time Calculation (min) 38 min    Activity Tolerance Patient tolerated treatment well    Behavior During Therapy WFL for tasks assessed/performed                Past Medical History:  Diagnosis Date   Abnormal nuclear stress test 09/25/2015   Chest pain 01/05/2015   Epigastric pain    Essential hypertension, benign 12/11/2013   Hypertension    Migraine    OSA on CPAP    Followed by Dr. Hagan with [facility]   Pain of upper extremity 01/07/2020   Pseudotumor cerebri    SOB (shortness of breath) 09/16/2016   Past Surgical History:  Procedure Laterality Date   CARDIAC CATHETERIZATION N/A 09/25/2015   Procedure: Left Heart Cath and Coronary Angiography;  Surgeon: Peter M Jordan, MD;  Location: [facility];  Service: Cardiovascular;  Laterality: N/A;   CESAREAN SECTION     Patient Active Problem List   Diagnosis Date Noted   Pain of upper extremity 01/07/2020   SOB (shortness of breath) 09/16/2016   OSA on CPAP    Abnormal nuclear stress test 09/25/2015   Chest pain 01/05/2015   Essential hypertension, benign 12/11/2013    REFERRING DIAG: R35.0 (ICD-10-CM) - Frequency of micturition R35.0 (ICD-10-CM) - Urinary frequency  THERAPY DIAG:  Abnormal posture  Muscle weakness (generalized)  Rationale for Evaluation and Treatment Rehabilitation  PERTINENT HISTORY: One c-section, bil knee pain  PRECAUTIONS: NA  SUBJECTIVE: Pt states that she was not as good about exercises getting ready for school.  I can't stand too long because of the knees. I  have been drinking 2 bottles of water for the most part  PAIN:  Are you having pain? Yes NPRS scale: 4-5/10 Pain location: back Pain orientation: Medial and Lower  PAIN TYPE: aching and dull Pain description: intermittent  Aggravating factors: just flares up Relieving factors: rest    SUBJECTIVE:                                                                                                                                                                                              SUBJECTIVE STATEMENT: Patient states that she has noticed that she is not holding urine as well, especially at night. She will wake up and bed is wet.The nocturnal enuresis has only happened 3x and seems related to drinking smoothies before bed. She is also having urgency and urge incontinence.     Fluid intake: Yes: She does not drink much water - a bottle to a bottle and a half a day; she does tea and juice; no soda or alcohol; occasional hot tea       PAIN:  Are you having pain? No   PRECAUTIONS: None   WEIGHT BEARING RESTRICTIONS No   FALLS:  Has patient fallen in last 6 months? No   LIVING ENVIRONMENT: Lives with: lives with their family Lives in: House/apartment   OCCUPATION: not currently employed   PLOF: Independent   PATIENT GOALS decrease urinary leaking   PERTINENT HISTORY:  One c-section, bil knee pain Sexual abuse: No   BOWEL MOVEMENT Pain with bowel movement: No Type of bowel movement:Frequency every other day or two days missed and Strain Yes  Fully empty rectum: No Leakage: No Pads: No Fiber supplement: No   URINATION Pain with urination: No Fully empty bladder: No -  Stream:  WNL Urgency: Yes: when she tries to hold  Frequency: typically every hour Leakage: Urge to void, Walking to the bathroom, and then couple times during the night Pads: No - not helpful at night due to amount; none during the day   INTERCOURSE Pain with intercourse:  not painful, but  dry Ability to have vaginal penetration:  Yes: - Climax: yes - does not do very often *feels like she has issue with itchiness - MD has ruled out yeast and other infections - she is being told that there is nothing wrong      PREGNANCY Vaginal deliveries 0 Tearing No C-section deliveries 1 Currently pregnant No   PROLAPSE None       OBJECTIVE:    PATIENT SURVEYS:    PFIQ-7 - 52   COGNITION:            Overall cognitive status: Within functional limits for tasks assessed                          SENSATION:            Light touch: Appears intact            Proprioception: Appears intact   GAIT:   Comments: antalgic gait pattern, SPC Rt LE                POSTURE: rounded shoulders and forward head      PALPATION:   General  No abdominal tenderness                 External Perineal Exam WNL                             Internal Pelvic Floor No tenderness in pelvic floor   Patient confirms identification and approves PT to assess internal pelvic floor and treatment Yes   PELVIC MMT: Pelvic floor strength 3/5 Endurance 5 seconds Repeat contractions 13x         TONE: WNL   PROLAPSE: Grade 1 anterior vaignal wall laxity   TREATMENT 06/15/22  Exercises: Strengthening: Bridge with ball squeeze 2 x 10 (core/pelvic floor contraction) Seated clam shells 10x Reverse clam   side lying 15 x bil Leaning on table kegel (added to HEP) working on [employer] and working on coordinating with breathing Self-care:   TREATMENT 06/02/22 Neuromuscular re-education: Core facilitation: Supine march 2 x 10 - form correction Leg extensions 10x bil - cues for improved core facilitation Exercises: Strengthening: Bridge with ball squeeze 2 x 10 (core/pelvic floor contraction) Seated clam shells blue band 2 x 10 Seated hip IR red band and ball squeeze 2 x 10 Self-care: Reviewed lubricants and increasing foreplay prior to intimate activities Squatty potty  options - helped research lower option  TREATMENT 05/18/22 Neuromuscular re-education: Core facilitation: Seated march 3 x 10 Band pull down green 2 x 10 Exercises: Strengthening: Seated clam shell 3 x 10 green band Seated hip adduction isometric, 2 x 10, 5 seconds Self-care: Working on increasing fluid intake that does not have caffeine Strict bladder retraining in detail     PATIENT EDUCATION:  Education details: see above self-care; HEP progressions Person educated: Patient Education method: Explanation, Demonstration, Tactile cues, Verbal cues, and Handouts Education comprehension: verbalized understanding     HOME EXERCISE PROGRAM: [id number]   ASSESSMENT:   CLINICAL IMPRESSION: Pt was educated on isolated kegel exercises.  She was able to feel the muscles more engage with staggered stance.  We discussed breathing during all exercises for improved core/pelvic floor facilitation. benefit from skilled PT intervention in order to decrease nocturnal enuresis, improve urgency/incontinence, and improve QOL.      OBJECTIVE IMPAIRMENTS decreased activity tolerance, decreased coordination, decreased endurance, decreased strength, increased fascial restrictions, increased muscle spasms, postural dysfunction, and pain.    ACTIVITY LIMITATIONS continence   PARTICIPATION LIMITATIONS: interpersonal relationship and community activity   PERSONAL FACTORS 1 comorbidity: c-section  are also affecting patient's functional outcome.    REHAB POTENTIAL: Good   CLINICAL DECISION MAKING: Stable/uncomplicated   EVALUATION COMPLEXITY: Low     GOALS: Goals reviewed with patient? Yes   SHORT TERM GOALS: Target date: 05/26/2022 - updated 06/02/22   Pt will be independent with HEP.    Baseline: Goal status: MET   2.  Pt will be independent with the knack, urge suppression technique, and double voiding in order to improve bladder habits and decrease urinary incontinence.    Baseline:   Goal status: Met   3.  Pt will be independent with use of squatty potty, relaxed toileting mechanics, and improved bowel movement techniques in order to increase ease of bowel movements and complete evacuation.    Baseline:  Goal status: In Progress     LONG TERM GOALS: Target date: 07/21/2022 - updated 06/02/22   Pt will be independent with advanced HEP.    Baseline:  Goal status: IN PROGRESS   2.  Pt will demonstrate normal pelvic floor muscle tone and A/ROM, able to achieve 4/5 strength with contractions and 10 sec endurance, in order to provide appropriate lumbopelvic support in functional activities.    Baseline:  Goal status: IN PROGRESS   3.  Pt will report no episodes of urinary incontinence in order to improve sleeping, confidence in community activities and personal hygiene.    Baseline: no leakage in the past  few weeks Goal status: IN PROGRESS   4.  Pt will be able to go 2-3 hours in between voids without urgency or incontinence in order to improve QOL and perform all functional activities with less difficulty.    Baseline: still JIC voiding; still have to go right after drinking Goal status: IN PROGRESS  PLAN: PT FREQUENCY: 1x/week   PT DURATION: 12 weeks   PLANNED INTERVENTIONS: Therapeutic exercises, Therapeutic activity, Neuromuscular re-education, Balance training, Gait training, Patient/Family education, Joint mobilization, Dry Needling, Biofeedback, and Manual therapy   PLAN FOR NEXT SESSION: Core/pelvic floor progressions.      Jakki DeSenglau, PT 06/15/22 1:39 PM      

## 2022-06-15 ENCOUNTER — Ambulatory Visit: Payer: Medicaid Other | Attending: Family Medicine | Admitting: Physical Therapy

## 2022-06-15 ENCOUNTER — Encounter: Payer: Self-pay | Admitting: Physical Therapy

## 2022-06-15 DIAGNOSIS — R293 Abnormal posture: Secondary | ICD-10-CM | POA: Insufficient documentation

## 2022-06-15 DIAGNOSIS — M6281 Muscle weakness (generalized): Secondary | ICD-10-CM | POA: Insufficient documentation

## 2022-06-17 ENCOUNTER — Telehealth: Payer: Self-pay | Admitting: Psychiatry

## 2022-06-17 NOTE — Telephone Encounter (Signed)
Pt called back and stated that the pharmacy did not receive the prescription for her topiramate (TOPAMAX) 25 MG capsule nor her Erenumab-aooe (AIMOVIG) 140 MG/ML SOAJ  Please advise.

## 2022-06-17 NOTE — Telephone Encounter (Signed)
Pt asking if Dr. Delena Bali can take over prescribing the Baclofen 10 mg. Would like a call from the nurse

## 2022-06-20 NOTE — Telephone Encounter (Signed)
Called pharmacy, spoke with Herbert Seta. She stated she has both Rx, topamax has refills, but next refill not due till 08/24/22 on topiramate, aimovig picked up on 06/17/22, next fill not due till 9/13. She stated patient recently picked up refills on both meds.

## 2022-06-20 NOTE — Telephone Encounter (Signed)
Called pharmacy, currently closed.

## 2022-06-21 NOTE — Therapy (Unsigned)
OUTPATIENT PHYSICAL THERAPY TREATMENT NOTE   Patient Name: Linda Cunningham MRN: [id number] DOB:July 01, 1966, 56 y.o., female Today's Date: 06/21/2022  PCP: [name], MD REFERRING PROVIDER: [name], MD  END OF SESSION:        Past Medical History:  Diagnosis Date   Abnormal nuclear stress test 09/25/2015   Chest pain 01/05/2015   Epigastric pain    Essential hypertension, benign 12/11/2013   Hypertension    Migraine    OSA on CPAP    Followed by Dr. Sima Matas with [facility]   Pain of upper extremity 01/07/2020   Pseudotumor cerebri    SOB (shortness of breath) 09/16/2016   Past Surgical History:  Procedure Laterality Date   CARDIAC CATHETERIZATION N/A 09/25/2015   Procedure: Left Heart Cath and Coronary Angiography;  Surgeon: Peter M Martinique, MD;  Location: [facility];  Service: Cardiovascular;  Laterality: N/A;   CESAREAN SECTION     Patient Active Problem List   Diagnosis Date Noted   Pain of upper extremity 01/07/2020   SOB (shortness of breath) 09/16/2016   OSA on CPAP    Abnormal nuclear stress test 09/25/2015   Chest pain 01/05/2015   Essential hypertension, benign 12/11/2013    REFERRING DIAG: R35.0 (ICD-10-CM) - Frequency of micturition R35.0 (ICD-10-CM) - Urinary frequency  THERAPY DIAG:  No diagnosis found.  Rationale for Evaluation and Treatment Rehabilitation  PERTINENT HISTORY: One c-section, bil knee pain  PRECAUTIONS: NA  SUBJECTIVE: Pt states that she was not as good about exercises getting ready for school.  I can't stand too long because of the knees. I have been drinking 2 bottles of water for the most part  PAIN:  Are you having pain? Yes NPRS scale: 4-5/10 Pain location: back Pain orientation: Medial and Lower  PAIN TYPE: aching and dull Pain description: intermittent  Aggravating factors: just flares up Relieving factors: rest    SUBJECTIVE:                                                                                                                                                                                             SUBJECTIVE STATEMENT: Patient states that she has noticed that she is not holding urine as well, especially at night. She will wake up and bed is wet.The nocturnal enuresis has only happened 3x and seems related to drinking smoothies before bed. She is also having urgency and urge incontinence.     Fluid intake: Yes: She does not drink much water - a bottle to a bottle and a half a day; she does tea and juice; no soda  or alcohol; occasional hot tea       PAIN:  Are you having pain? No   PRECAUTIONS: None   WEIGHT BEARING RESTRICTIONS No   FALLS:  Has patient fallen in last 6 months? No   LIVING ENVIRONMENT: Lives with: lives with their family Lives in: House/apartment   OCCUPATION: not currently employed   PLOF: Independent   PATIENT GOALS decrease urinary leaking   PERTINENT HISTORY:  One c-section, bil knee pain Sexual abuse: No   BOWEL MOVEMENT Pain with bowel movement: No Type of bowel movement:Frequency every other day or two days missed and Strain Yes  Fully empty rectum: No Leakage: No Pads: No Fiber supplement: No   URINATION Pain with urination: No Fully empty bladder: No -  Stream:  WNL Urgency: Yes: when she tries to hold  Frequency: typically every hour Leakage: Urge to void, Walking to the bathroom, and then couple times during the night Pads: No - not helpful at night due to amount; none during the day   INTERCOURSE Pain with intercourse:  not painful, but dry Ability to have vaginal penetration:  Yes: - Climax: yes - does not do very often *feels like she has issue with itchiness - MD has ruled out yeast and other infections - she is being told that there is nothing wrong      PREGNANCY Vaginal deliveries 0 Tearing No C-section deliveries 1 Currently pregnant No   PROLAPSE None       OBJECTIVE:    PATIENT SURVEYS:     PFIQ-7 - 52   COGNITION:            Overall cognitive status: Within functional limits for tasks assessed                          SENSATION:            Light touch: Appears intact            Proprioception: Appears intact   GAIT:   Comments: antalgic gait pattern, SPC Rt LE                POSTURE: rounded shoulders and forward head      PALPATION:   General  No abdominal tenderness                 External Perineal Exam WNL                             Internal Pelvic Floor No tenderness in pelvic floor   Patient confirms identification and approves PT to assess internal pelvic floor and treatment Yes   PELVIC MMT: Pelvic floor strength 3/5 Endurance 5 seconds Repeat contractions 13x         TONE: WNL   PROLAPSE: Grade 1 anterior vaignal wall laxity   TREATMENT 06/15/22  Exercises: Strengthening: Bridge with ball squeeze 2 x 10 (core/pelvic floor contraction) Seated clam shells 10x Reverse clam side lying 15 x bil Leaning on table kegel (added to HEP) working on [employer] and working on coordinating with breathing Self-care:   TREATMENT 06/02/22 Neuromuscular re-education: Core facilitation: Supine march 2 x 10 - form correction Leg extensions 10x bil - cues for improved core facilitation Exercises: Strengthening: Bridge with ball squeeze 2 x 10 (core/pelvic floor contraction) Seated clam shells blue band 2 x 10 Seated hip IR red band and ball squeeze 2 x  10 Self-care: Reviewed lubricants and increasing foreplay prior to intimate activities Squatty potty options - helped research lower option  TREATMENT 05/18/22 Neuromuscular re-education: Core facilitation: Seated march 3 x 10 Band pull down green 2 x 10 Exercises: Strengthening: Seated clam shell 3 x 10 green band Seated hip adduction isometric, 2 x 10, 5 seconds Self-care: Working on increasing fluid intake that does not have caffeine Strict bladder retraining in  detail     PATIENT EDUCATION:  Education details: see above self-care; HEP progressions Person educated: Patient Education method: Explanation, Demonstration, Tactile cues, Verbal cues, and Handouts Education comprehension: verbalized understanding     HOME EXERCISE PROGRAM: [id number]   ASSESSMENT:   CLINICAL IMPRESSION: Pt was educated on isolated kegel exercises.  She was able to feel the muscles more engage with staggered stance.  We discussed breathing during all exercises for improved core/pelvic floor facilitation. benefit from skilled PT intervention in order to decrease nocturnal enuresis, improve urgency/incontinence, and improve QOL.      OBJECTIVE IMPAIRMENTS decreased activity tolerance, decreased coordination, decreased endurance, decreased strength, increased fascial restrictions, increased muscle spasms, postural dysfunction, and pain.    ACTIVITY LIMITATIONS continence   PARTICIPATION LIMITATIONS: interpersonal relationship and community activity   PERSONAL FACTORS 1 comorbidity: c-section  are also affecting patient's functional outcome.    REHAB POTENTIAL: Good   CLINICAL DECISION MAKING: Stable/uncomplicated   EVALUATION COMPLEXITY: Low     GOALS: Goals reviewed with patient? Yes   SHORT TERM GOALS: Target date: 05/26/2022 - updated 06/02/22   Pt will be independent with HEP.    Baseline: Goal status: MET   2.  Pt will be independent with the knack, urge suppression technique, and double voiding in order to improve bladder habits and decrease urinary incontinence.    Baseline:  Goal status: Met   3.  Pt will be independent with use of squatty potty, relaxed toileting mechanics, and improved bowel movement techniques in order to increase ease of bowel movements and complete evacuation.    Baseline:  Goal status: In Progress     LONG TERM GOALS: Target date: 07/21/2022 - updated 06/02/22   Pt will be independent with advanced HEP.    Baseline:   Goal status: IN PROGRESS   2.  Pt will demonstrate normal pelvic floor muscle tone and A/ROM, able to achieve 4/5 strength with contractions and 10 sec endurance, in order to provide appropriate lumbopelvic support in functional activities.    Baseline:  Goal status: IN PROGRESS   3.  Pt will report no episodes of urinary incontinence in order to improve sleeping, confidence in community activities and personal hygiene.    Baseline: no leakage in the past  few weeks Goal status: IN PROGRESS   4.  Pt will be able to go 2-3 hours in between voids without urgency or incontinence in order to improve QOL and perform all functional activities with less difficulty.    Baseline: still JIC voiding; still have to go right after drinking Goal status: IN PROGRESS     PLAN: PT FREQUENCY: 1x/week   PT DURATION: 12 weeks   PLANNED INTERVENTIONS: Therapeutic exercises, Therapeutic activity, Neuromuscular re-education, Balance training, Gait training, Patient/Family education, Joint mobilization, Dry Needling, Biofeedback, and Manual therapy   PLAN FOR NEXT SESSION: Core/pelvic floor progressions.      Gustavus Bryant, PT 06/21/22 5:37 PM

## 2022-06-22 ENCOUNTER — Ambulatory Visit: Payer: Medicaid Other | Admitting: Physical Therapy

## 2022-06-22 ENCOUNTER — Encounter: Payer: Self-pay | Admitting: Physical Therapy

## 2022-06-22 DIAGNOSIS — R293 Abnormal posture: Secondary | ICD-10-CM

## 2022-06-22 DIAGNOSIS — M6281 Muscle weakness (generalized): Secondary | ICD-10-CM

## 2022-06-29 ENCOUNTER — Ambulatory Visit: Payer: Medicaid Other | Admitting: Physical Therapy

## 2022-06-29 NOTE — Therapy (Addendum)
OUTPATIENT PHYSICAL THERAPY TREATMENT NOTE   Patient Name: Linda Cunningham MRN: [id number] DOB:07/11/1966, 56 y.o., female Today's Date: 06/30/2022  PCP: [name], MD REFERRING PROVIDER: [name], MD  END OF SESSION:   PT End of Session - 06/30/22 1451     Visit Number 7    Date for PT Re-Evaluation 07/21/22    Authorization Type [employer] Medicaid    PT Start Time 3383    PT Stop Time 1526    PT Time Calculation (min) 39 min    Activity Tolerance Patient tolerated treatment well    Behavior During Therapy [facility] for tasks assessed/performed                 Past Medical History:  Diagnosis Date   Abnormal nuclear stress test 09/25/2015   Chest pain 01/05/2015   Epigastric pain    Essential hypertension, benign 12/11/2013   Hypertension    Migraine    OSA on CPAP    Followed by Dr. Sima Matas with [place]   Pain of upper extremity 01/07/2020   Pseudotumor cerebri    SOB (shortness of breath) 09/16/2016   Past Surgical History:  Procedure Laterality Date   CARDIAC CATHETERIZATION N/A 09/25/2015   Procedure: Left Heart Cath and Coronary Angiography;  Surgeon: Peter M Martinique, MD;  Location: [facility];  Service: Cardiovascular;  Laterality: N/A;   CESAREAN SECTION     Patient Active Problem List   Diagnosis Date Noted   Pain of upper extremity 01/07/2020   SOB (shortness of breath) 09/16/2016   OSA on CPAP    Abnormal nuclear stress test 09/25/2015   Chest pain 01/05/2015   Essential hypertension, benign 12/11/2013    REFERRING DIAG: R35.0 (ICD-10-CM) - Frequency of micturition R35.0 (ICD-10-CM) - Urinary frequency  THERAPY DIAG:  Abnormal posture  Muscle weakness (generalized)  Rationale for Evaluation and Treatment Rehabilitation  PERTINENT HISTORY: One c-section, bil knee pain  PRECAUTIONS: NA  SUBJECTIVE: I twisted my knee and was not able to do some of the exercises since then.  It happened Saturday.  It is getting better but  still hurts.     PAIN:  Are you having pain? Yes NPRS scale: 5/10 Pain location: knees Pain orientation: Right and Left  PAIN TYPE: aching and dull Pain description: intermittent  Aggravating factors: walking and turning Relieving factors: sitting it is fine   SUBJECTIVE:  SUBJECTIVE STATEMENT: Patient states that she has noticed that she is not holding urine as well, especially at night. She will wake up and bed is wet.The nocturnal enuresis has only happened 3x and seems related to drinking smoothies before bed. She is also having urgency and urge incontinence.     Fluid intake: Yes: She does not drink much water - a bottle to a bottle and a half a day; she does tea and juice; no soda or alcohol; occasional hot tea       PAIN:  Are you having pain? No   PRECAUTIONS: None   WEIGHT BEARING RESTRICTIONS No   FALLS:  Has patient fallen in last 6 months? No   LIVING ENVIRONMENT: Lives with: lives with their family Lives in: House/apartment   OCCUPATION: not currently employed   PLOF: Independent   PATIENT GOALS decrease urinary leaking   PERTINENT HISTORY:  One c-section, bil knee pain Sexual abuse: No   BOWEL MOVEMENT Pain with bowel movement: No Type of bowel movement:Frequency every other day or two days missed and Strain Yes  Fully empty rectum: No Leakage: No Pads: No Fiber supplement: No   URINATION Pain with urination: No Fully empty bladder: No -  Stream:  WNL Urgency: Yes: when she tries to hold  Frequency: typically every hour Leakage: Urge to void, Walking to the bathroom, and then couple times during the night Pads: No - not helpful at night due to amount; none during the day   INTERCOURSE Pain with intercourse:  not painful, but dry Ability to have vaginal  penetration:  Yes: - Climax: yes - does not do very often *feels like she has issue with itchiness - MD has ruled out yeast and other infections - she is being told that there is nothing wrong      PREGNANCY Vaginal deliveries 0 Tearing No C-section deliveries 1 Currently pregnant No   PROLAPSE None       OBJECTIVE:    PATIENT SURVEYS:    PFIQ-7 - 52   COGNITION:            Overall cognitive status: Within functional limits for tasks assessed                          SENSATION:            Light touch: Appears intact            Proprioception: Appears intact   GAIT:   Comments: antalgic gait pattern, SPC Rt LE                POSTURE: rounded shoulders and forward head      PALPATION:   General  No abdominal tenderness                 External Perineal Exam WNL                             Internal Pelvic Floor No tenderness in pelvic floor   Patient confirms identification and approves PT to assess internal pelvic floor and treatment Yes   PELVIC MMT: Pelvic floor strength 3/5 Endurance 5 seconds Repeat contractions 13x         TONE: WNL   PROLAPSE: Grade 1 anterior vaignal wall laxity    TREATMENT 06/30/22  Exercises: Circles on pball Marches on pball Kegel and transversus abdominus activation on pball Sit to stand  with kegel Heel slides 20x Nustep x 6 min (L4 on green)  Self-care: Discussed sleep patterns and improved sleep for pain reduction   TREATMENT 06/22/22  Exercises: Strengthening: Bridge with ball squeeze 1 x 10 (core/pelvic floor contraction) Sit to stand and wall slide with posterior pelvic tilt 10x Reverse clam side lying 15 x bil Side lying hip adduction with knee bent 15x bil Leaning on table kegel with march - 10x  Self-care: Discussed more  water intake  TREATMENT 06/15/22  Exercises: Strengthening: Bridge with ball squeeze 2 x 10 (core/pelvic floor contraction) Seated clam shells 10x Reverse clam side lying 15 x  bil Leaning on table kegel (added to HEP) working on [employer] and working on coordinating with breathing Self-care:      PATIENT EDUCATION:  Education details: see above self-care; HEP progressions Person educated: Patient Education method: Consulting civil engineer, Media planner, Corporate treasurer cues, Verbal cues, and Handouts Education comprehension: verbalized understanding     HOME EXERCISE PROGRAM: [id number]   ASSESSMENT:   CLINICAL IMPRESSION: Pt was in more pain today because of knee.  Pt did well with exercises but fatigues with core activation and feels it more in her back when core gets fatigued.  In general she is doing the same without leakage at night.  She does report not sleeping much at night and sleep habits were discussed and encouraged pt to make some changes to improve on this.  Pt will benefit from skilled PT to address core strength for improved posture and ability to engage core correctly.     OBJECTIVE IMPAIRMENTS decreased activity tolerance, decreased coordination, decreased endurance, decreased strength, increased fascial restrictions, increased muscle spasms, postural dysfunction, and pain.    ACTIVITY LIMITATIONS continence   PARTICIPATION LIMITATIONS: interpersonal relationship and community activity   PERSONAL FACTORS 1 comorbidity: c-section  are also affecting patient's functional outcome.    REHAB POTENTIAL: Good   CLINICAL DECISION MAKING: Stable/uncomplicated   EVALUATION COMPLEXITY: Low     GOALS: Goals reviewed with patient? Yes   SHORT TERM GOALS: Target date: 05/26/2022 - updated 06/02/22   Pt will be independent with HEP.    Baseline: Goal status: MET   2.  Pt will be independent with the knack, urge suppression technique, and double voiding in order to improve bladder habits and decrease urinary incontinence.    Baseline:  Goal status: Met   3.  Pt will be independent with use of squatty potty, relaxed toileting mechanics, and  improved bowel movement techniques in order to increase ease of bowel movements and complete evacuation.    Baseline:  Goal status: In Progress     LONG TERM GOALS: Target date: 07/21/2022 - updated 06/02/22   Pt will be independent with advanced HEP.    Baseline:  Goal status: IN PROGRESS   2.  Pt will demonstrate normal pelvic floor muscle tone and A/ROM, able to achieve 4/5 strength with contractions and 10 sec endurance, in order to provide appropriate lumbopelvic support in functional activities.    Baseline: held 10 sec with external palpation Goal status: IN PROGRESS   3.  Pt will report no episodes of urinary incontinence in order to improve sleeping, confidence in community activities and personal hygiene.    Baseline: one time had leakage because I waited too long but none other than no leakage Goal status: IN PROGRESS   4.  Pt will be able to go 2-3 hours in between voids without urgency or incontinence in order to improve QOL and  perform all functional activities with less difficulty.    Baseline: I do go just before bed Goal status: IN PROGRESS     PLAN: PT FREQUENCY: 1x/week   PT DURATION: 12 weeks   PLANNED INTERVENTIONS: Therapeutic exercises, Therapeutic activity, Neuromuscular re-education, Balance training, Gait training, Patient/Family education, Joint mobilization, Dry Needling, Biofeedback, and Manual therapy   PLAN FOR NEXT SESSION: RE-EVAL and maybe final HEP for continued core strength f/u on sleeping, handout for sleep hygeine      Gustavus Bryant, PT 06/30/22 2:51 PM  PHYSICAL THERAPY DISCHARGE SUMMARY  Visits from Start of Care: 7  Current functional level related to goals / functional outcomes: See above goals   Remaining deficits: See above   Education / Equipment: HEP   Patient agrees to discharge. Patient goals were partially met. Patient is being discharged due to not returning since the last visit.  Gustavus Bryant,  PT 08/19/22 9:17 AM

## 2022-06-30 ENCOUNTER — Encounter: Payer: Self-pay | Admitting: Physical Therapy

## 2022-06-30 ENCOUNTER — Ambulatory Visit: Payer: Medicaid Other | Admitting: Physical Therapy

## 2022-06-30 DIAGNOSIS — M6281 Muscle weakness (generalized): Secondary | ICD-10-CM

## 2022-06-30 DIAGNOSIS — R293 Abnormal posture: Secondary | ICD-10-CM | POA: Diagnosis not present

## 2022-07-21 ENCOUNTER — Ambulatory Visit: Payer: Medicaid Other | Admitting: Physical Therapy

## 2022-08-17 ENCOUNTER — Telehealth: Payer: Self-pay

## 2022-08-17 ENCOUNTER — Telehealth: Payer: Self-pay | Admitting: Psychiatry

## 2022-08-17 MED ORDER — TOPIRAMATE 100 MG PO TABS: 100.0000 mg | ORAL_TABLET | Freq: Every day | ORAL | 6 refills | Status: DC

## 2022-08-17 NOTE — Telephone Encounter (Signed)
It's unlikely they'll cover any of the injections based on her current headache days. For now let's increase Topamax to 100 mg and see how she does. If headaches worsen in frequency we can try to get Aimovig approved again

## 2022-08-17 NOTE — Telephone Encounter (Signed)
Contacted pt back, informed her of Dr Georgina Peer recommendations. She verbally understood and was appreciative.

## 2022-08-17 NOTE — Telephone Encounter (Signed)
Pt is calling. Requesting refill on baclofen (LIORESAL) 10 MG tablet and topiramate (TOPAMAX) 25 MG capsule, Erenumab-aooe (AIMOVIG) 140 MG/ML SOAJ. Requesting refills be sent to [facility]. Pt said Aimovig is going to need a prior authorization.

## 2022-08-17 NOTE — Addendum Note (Signed)
Addended by: Genia Harold on: 08/17/2022 04:17 PM   Modules accepted: Orders

## 2022-08-17 NOTE — Telephone Encounter (Signed)
PA for Aimovig 140MG /ML auto-injectors submitted via CMM. Key: [id number] Your information has been sent to [employer]. Awaiting determination

## 2022-08-17 NOTE — Telephone Encounter (Signed)
PA was denied, Per your health plan's criteria, this drug is covered if you meet the following: You have four or more migraine days per month for at least three months.   Pts formulary changed and is no longer covered, do you have a alternative?

## 2022-08-17 NOTE — Telephone Encounter (Signed)
Contacted pt, LVM per DPR, informing her all 3 Rx was sent 03/10/22 for 20yr supply. Advised to contact pharmacy for refills.  I did contact pharmacy to verify, she has refills for all 3 on fine.   Will submitted PA for Aimovig as her formulary has changed

## 2022-09-14 ENCOUNTER — Ambulatory Visit: Payer: Medicaid Other | Admitting: Psychiatry

## 2022-09-22 ENCOUNTER — Ambulatory Visit: Payer: Medicaid Other | Admitting: Psychiatry

## 2022-09-22 VITALS — BP 116/82 | HR 78 | Ht 59.0 in | Wt 237.0 lb

## 2022-09-22 DIAGNOSIS — G43119 Migraine with aura, intractable, without status migrainosus: Secondary | ICD-10-CM

## 2022-09-22 MED ORDER — AIMOVIG 140 MG/ML ~~LOC~~ SOAJ: 140.0000 mg | SUBCUTANEOUS | 6 refills | Status: DC

## 2022-09-22 NOTE — Patient Instructions (Signed)
Please have most recent eye exam notes faxed to our office

## 2022-09-22 NOTE — Progress Notes (Signed)
   CC:  headaches  Follow-up Visit  Last visit: 03/10/22  Brief HPI: 56 year old female with a history of IIH, migraines, OSA, HTN, HLD who follows in clinic for migraines.  At her last visit she was continued on Aimovig and Topamax for headache prevention. Ubrelvy samples were provided for rescue. Interval History: Headaches have worsened since her last visit. She has weekly headaches. Migraines can last 2-3 days at a time. Her insurance formulary changed and no longer covered Aimovig 140 mg (stated she had too few headache days per month, even though this is because her headaches were well-controlled on Aimovig), so this was discontinued. Her Topamax was increased to 100 mg QHS but she is not sure if that has helped. She is still having paresthesias. Bernita Raisin was not helpful. Continues to take baclofen as needed which helps.  Last ophtho exam was in August 2023. She did get a new glasses prescription which she is still adjusting to. No papilledema was noted.  Migraine days per month: 8 Headache free days per month: 22  Current Headache Regimen: Preventative: Topamax 100 mg QHS Abortive: Baclofen 10 mg TID PRN, Tylenol   Prior Therapies                                  Prevention: Aimovig 140 mg monthly Topamax 100 mg QHS  Rescue: Maxalt 10 mg PRN Nurtec - lack of efficacy Ubrelvy - lack of efficacy Baclofen 10 mg TID PRN She has been told to avoid triptans due to HTN per cardiology and to avoid NSAIDs due to kidney function  Physical Exam:   Vital Signs: BP 116/82   Pulse 78   Ht 4\' 11"  (1.499 m)   Wt 237 lb (107.5 kg)   BMI 47.87 kg/m  GENERAL:  well appearing, in no acute distress, alert  SKIN:  Color, texture, turgor normal. No rashes or lesions HEAD:  Normocephalic/atraumatic. RESP: normal respiratory effort MSK:  No gross joint deformities.   NEUROLOGICAL: Mental Status: Alert, oriented to person, place and time, Follows commands, and Speech fluent and  appropriate. Cranial Nerves: PERRL, face symmetric, no dysarthria, hearing grossly intact Motor: moves all extremities equally Gait: normal-based.  IMPRESSION: 56 year old female with a history of IIH, migraines, OSA, HTN, HLD who presents for follow up of migraines. No papilledema present on her most recent eye exam. Her headaches have worsened since insurance stopped covering Aimovig. Will try again to get Aimovig approved as she is now having >4 headache days per month. Will continue current Topamax dose for now.  PLAN: -Prevention: Restart Aimovig 140 mg monthly, continue Topamax 100 mg QHS -Rescue: Continue baclofen 10 mg TID PRN, Tylenol PRN   Follow-up: 6 months  I spent a total of 28 minutes on the date of the service. Headache education was done. Discussed treatment options including preventive and acute medications. Discussed medication side effects, adverse reactions and drug interactions. Written educational materials and patient instructions outlining all of the above were given.  40, MD 09/22/22 11:16 AM

## 2022-09-26 ENCOUNTER — Telehealth: Payer: Self-pay | Admitting: Neurology

## 2022-09-26 NOTE — Telephone Encounter (Signed)
PA completed on CMM/[employer]. KEY: [id number] Will await determination

## 2022-09-27 ENCOUNTER — Ambulatory Visit: Payer: Medicaid Other | Attending: Family Medicine

## 2022-09-27 ENCOUNTER — Other Ambulatory Visit: Payer: Self-pay

## 2022-09-27 DIAGNOSIS — M6281 Muscle weakness (generalized): Secondary | ICD-10-CM | POA: Insufficient documentation

## 2022-09-27 DIAGNOSIS — M5459 Other low back pain: Secondary | ICD-10-CM | POA: Insufficient documentation

## 2022-09-27 DIAGNOSIS — R293 Abnormal posture: Secondary | ICD-10-CM | POA: Diagnosis not present

## 2022-09-27 DIAGNOSIS — R279 Unspecified lack of coordination: Secondary | ICD-10-CM | POA: Diagnosis present

## 2022-09-27 NOTE — Therapy (Signed)
OUTPATIENT PHYSICAL THERAPY FEMALE PELVIC EVALUATION   Patient Name: Linda Cunningham MRN: [id number] DOB:01/09/1966, 56 y.o., female Today's Date: 09/27/2022  END OF SESSION:  PT End of Session - 09/27/22 1150     Visit Number 1    Date for PT Re-Evaluation 12/20/22    PT Start Time 1149    PT Stop Time 1225    PT Time Calculation (min) 36 min    Activity Tolerance Patient tolerated treatment well    Behavior During Therapy [facility] for tasks assessed/performed             Past Medical History:  Diagnosis Date   Abnormal nuclear stress test 09/25/2015   Chest pain 01/05/2015   Epigastric pain    Essential hypertension, benign 12/11/2013   Hypertension    Migraine    OSA on CPAP    Followed by Dr. Catalina Lunger with [facility]   Pain of upper extremity 01/07/2020   Pseudotumor cerebri    SOB (shortness of breath) 09/16/2016   Past Surgical History:  Procedure Laterality Date   CARDIAC CATHETERIZATION N/A 09/25/2015   Procedure: Left Heart Cath and Coronary Angiography;  Surgeon: Peter M Swaziland, MD;  Location: [facility];  Service: Cardiovascular;  Laterality: N/A;   CESAREAN SECTION     Patient Active Problem List   Diagnosis Date Noted   Pain of upper extremity 01/07/2020   SOB (shortness of breath) 09/16/2016   OSA on CPAP    Abnormal nuclear stress test 09/25/2015   Chest pain 01/05/2015   Essential hypertension, benign 12/11/2013    PCP: Macy Mis, MD  REFERRING PROVIDER: Macy Mis, MD  REFERRING DIAG: N39.41 (ICD-10-CM) - Urge incontinence  THERAPY DIAG:  Abnormal posture  Muscle weakness (generalized)  Unspecified lack of coordination  Other low back pain  Rationale for Evaluation and Treatment: Rehabilitation  ONSET DATE: 10/07/2021   SUBJECTIVE:                                                                                                                                                                                             09/27/22 SUBJECTIVE STATEMENT: Pt states that she is still having difficulty with urinary urgency and incontinence. She is having difficulty with frequency at night too, but she is trying not to get up due to it being cold. Having a very hard time with urge suppression technique and making it to he bathroom - specifically with cooking and housework.       Fluid intake: Yes:      PAIN:  Are you having pain? Low back pain Intensity: 8/10  Description: throbbing Aggravating factors: housework, prolonged standing, walking Relieving factors: limiting bending/pulling   PRECAUTIONS: None   WEIGHT BEARING RESTRICTIONS No   FALLS:  Has patient fallen in last 6 months? No   LIVING ENVIRONMENT: Lives with: lives with their family Lives in: House/apartment   OCCUPATION: not currently employed   PLOF: Independent   PATIENT GOALS decrease urinary leaking   PERTINENT HISTORY:  One c-section, bil knee pain Sexual abuse: No   BOWEL MOVEMENT Pain with bowel movement: No Type of bowel movement:Frequency every other day or two days missed and Strain Yes  Fully empty rectum: No Leakage: No Pads: No Fiber supplement: No   URINATION Pain with urination: No Fully empty bladder: No -  Stream:  WNL Urgency: Yes: when she tries to hold  Frequency: typically every hour to every hour and a half; she is not leaking at night anymore, but she is getting up at night several times Leakage: Urge to void, Walking to the bathroom, and then couple times during the night - she does feel like she is doing better at catching it on the way to the bathroom Pads: No - not helpful at night due to amount; none during the day   INTERCOURSE Pain with intercourse:  not painful, but dry Ability to have vaginal penetration:  Yes: - Climax: yes - does not do very often *feels like she has issue with itchiness - MD has ruled out yeast and other infections - she is being told that there is nothing wrong       PREGNANCY Vaginal deliveries 0 Tearing No C-section deliveries 1 Currently pregnant No   PROLAPSE None       OBJECTIVE:    PATIENT SURVEYS:    PFIQ-7 - 67   COGNITION:            Overall cognitive status: Within functional limits for tasks assessed                          SENSATION:            Light touch: Appears intact            Proprioception: Appears intact   GAIT:   Comments: antalgic gait pattern, SPC Rt LE                POSTURE: rounded shoulders and forward head      PALPATION:   General  No abdominal tenderness                 External Perineal Exam WNL                             Internal Pelvic Floor No tenderness in pelvic floor   Patient confirms identification and approves PT to assess internal pelvic floor and treatment Yes   PELVIC MMT: Pelvic floor strength 3/5 Endurance 4 seconds Repeat contractions 13x - poor motor control         TONE: WNL   PROLAPSE: Grade 1 anterior vaignal wall laxity   TODAY'S TREATMENT 09/27/22: EVAL  Neuromuscular re-education: Pelvic floor contraction training: Quick flicks 10x Long holds 2 x 10 seconds Urge suppression technique quick flicks Self-care: Urge suppression technique review Squatty potty/relaxed toileting mechanics review     Check all possible CPT codes: 69794- Neuro Re-education and 97535 - Self Care  If treatment provided at initial evaluation, no treatment charged due to lack of authorization.                                PATIENT EDUCATION:  Education details: see above self-care Person educated: Patient Education method: Explanation, Demonstration, Tactile cues, Verbal cues, and Handouts Education comprehension: verbalized understanding     HOME EXERCISE PROGRAM: [id number]   ASSESSMENT:   CLINICAL IMPRESSION: Patient is a 56 y.o. female who was seen today for physical therapy evaluation and treatment for urinary frequency and  urgency/urge incontinence. Exam findings notable for pelvic floor weakness 3/5, endurance 4 seconds, 13 repetitions, and grade 1 anterior vaginal wall laxity. Signs and symptoms are most consistent with poor bladder/hydration habits and pelvic floor weakness/poor motor control. Initial treatment included pelvic floor contraction training and review of urge suppression technique/squatty potty/toileting mechanics. She will benefit from skilled PT intervention in order to decrease urinary frequency, improve urgency/incontinence, and improve QOL.      OBJECTIVE IMPAIRMENTS decreased activity tolerance, decreased coordination, decreased endurance, decreased strength, increased fascial restrictions, increased muscle spasms, postural dysfunction, and pain.    ACTIVITY LIMITATIONS continence, housework, cooking   PARTICIPATION LIMITATIONS: interpersonal relationship and community activity   PERSONAL FACTORS 1 comorbidity: c-section  are also affecting patient's functional outcome.    REHAB POTENTIAL: Good   CLINICAL DECISION MAKING: Stable/uncomplicated   EVALUATION COMPLEXITY: Low     GOALS: Goals reviewed with patient? Yes   SHORT TERM GOALS: Target date: 10/25/22   Pt will be independent with HEP.    Baseline: Goal status: INITIAL   2. Pt will be able to go 2-3 hours in between voids without urgency or incontinence in order to improve QOL and perform all functional activities with less difficulty.    Baseline:  Goal status: INITIAL   3.  Pt will be independent with use of squatty potty, relaxed toileting mechanics, and improved bowel movement techniques in order to increase ease of bowel movements and complete evacuation and improve urinary urgency.    Baseline:  Goal status: INITIAL     LONG TERM GOALS: Target date: 12/20/22   Pt will be independent with advanced HEP.    Baseline:  Goal status: INITIAL   2.  Pt will demonstrate normal pelvic floor muscle tone and A/ROM, able  to achieve 4/5 strength with contractions and 10 sec endurance, in order to provide appropriate lumbopelvic support in functional activities.    Baseline:  Goal status: INITIAL   3.  Pt will report no episodes of urinary incontinence in order to improve sleeping, confidence in community activities and personal hygiene.    Baseline:  Goal status: INITIAL   4.  Pt will be able to go 2-3 hours in between voids without urgency or incontinence in order to improve QOL and perform all functional activities with less difficulty.    Baseline:  Goal status: INITIAL     PLAN: PT FREQUENCY: 1x/week   PT DURATION: 12 weeks   PLANNED INTERVENTIONS: Therapeutic exercises, Therapeutic activity, Neuromuscular re-education, Balance training, Gait training, Patient/Family education, Joint mobilization, Dry Needling, Biofeedback, and Manual therapy   PLAN FOR NEXT SESSION: Begin core training; review lifestyle modifications; strict bladder retraining, pelvic floor strengthening in standing.   Julio Alm, PT, DPT11/21/2312:41 PM

## 2022-09-27 NOTE — Patient Instructions (Signed)
Urge suppression technique: A technique to help you hold urine until it's an appropriate time to go, whether this is making it home or trying to reach a specific voiding time frame according to your schedule. It helps to send signals from the bladder to the brain that say you don't actually have to void urine right now. This most likely only give you several minutes of relief at first, but repeat as needed; the benefit will last longer as you use this technique more and get into better bladder habits. ?  The technique: o Perform 5 quick flicks (Kegels) rapidly, not worrying about fully relaxing in between each (only in this technique). o Then perform several deep belly breaths while focusing on relaxing the pelvic floor. o Go do something else to help distract yourself from the urge to urinate. o Repeat as needed.      Julio Alm, PT, DPT11/21/2312:23 PM

## 2022-10-03 ENCOUNTER — Encounter: Payer: Self-pay | Admitting: Neurology

## 2022-10-03 NOTE — Telephone Encounter (Signed)
PA was denied. Require a signature on form from the patient in order for me to appeal on her behalf.  Called the patient and advised her of this. She will come in and sign the form. Once that is completed I will submit the appeal for the patient.

## 2022-10-03 NOTE — Telephone Encounter (Signed)
I have forwarded the signed consent and appeal letter to the fax number that is provided. Received confirmation. Will await determination

## 2022-10-04 ENCOUNTER — Ambulatory Visit: Payer: Medicaid Other

## 2022-10-04 DIAGNOSIS — M5459 Other low back pain: Secondary | ICD-10-CM

## 2022-10-04 DIAGNOSIS — R293 Abnormal posture: Secondary | ICD-10-CM | POA: Diagnosis not present

## 2022-10-04 DIAGNOSIS — M6281 Muscle weakness (generalized): Secondary | ICD-10-CM

## 2022-10-04 DIAGNOSIS — R279 Unspecified lack of coordination: Secondary | ICD-10-CM

## 2022-10-04 NOTE — Patient Instructions (Addendum)
Bladder retraining:  Drink Madaline Savage an hour Stop water intake 3 hours before bed Try to go at least 2.5 hours between trips to the bathroom - go whether you need to or not.  For two weeks.    [facility] [address], [address] [place], [place] [address] Phone # [phone] Fax [phone]

## 2022-10-04 NOTE — Therapy (Signed)
OUTPATIENT PHYSICAL THERAPY TREATMENT NOTE   Patient Name: Linda Cunningham MRN: [id number] DOB:07/13/1966, 56 y.o., female Today's Date: 10/04/2022  PCP: Macy MisBriscoe, Kim K, MD REFERRING PROVIDER: Macy MisBriscoe, Kim K, MD  END OF SESSION:   PT End of Session - 10/04/22 1151     Visit Number 2    Date for PT Re-Evaluation 12/20/22    Authorization Type [employer] Medicaid    PT Start Time 1149    PT Stop Time 1227    PT Time Calculation (min) 38 min    Activity Tolerance Patient tolerated treatment well    [facility] for tasks assessed/performed             Past Medical History:  Diagnosis Date   Abnormal nuclear stress test 09/25/2015   Chest pain 01/05/2015   Epigastric pain    Essential hypertension, benign 12/11/2013   Hypertension    Migraine    OSA on CPAP    Followed by Dr. Catalina LungerHagan with [facility]   Pain of upper extremity 01/07/2020   Pseudotumor cerebri    SOB (shortness of breath) 09/16/2016   Past Surgical History:  Procedure Laterality Date   CARDIAC CATHETERIZATION N/A 09/25/2015   Procedure: Left Heart Cath and Coronary Angiography;  Surgeon: Peter M SwazilandJordan, MD;  [facility];  Service: Cardiovascular;  Laterality: N/A;   CESAREAN SECTION     Patient Active Problem List   Diagnosis Date Noted   Pain of upper extremity 01/07/2020   SOB (shortness of breath) 09/16/2016   OSA on CPAP    Abnormal nuclear stress test 09/25/2015   Chest pain 01/05/2015   Essential hypertension, benign 12/11/2013    REFERRING DIAG: N39.41 (ICD-10-CM) - Urge incontinence  THERAPY DIAG:  Abnormal posture  Muscle weakness (generalized)  Unspecified lack of coordination  Other low back pain  Rationale for Evaluation and Treatment Rehabilitation  PERTINENT HISTORY: One c-section, bil knee pain  PRECAUTIONS: NA  SUBJECTIVE:                                                                                                                                                                                       SUBJECTIVE STATEMENT:  Pt states that she has a lot of low back pain while trying to cook for Thanksgiving. She was able to practice the urge suppression technique with improved success, but still feels like she was only able to do 2 of the quick squeezes; she was able to make it to the bathroom. She had very small leak on Sunday, but more control - she was cooking and waited too long - did have to  change pants.    PAIN:  Are you having pain? Yes: NPRS scale: 4/10 Pain location: low back Pain description: tight Aggravating factors: worse in morning, standing Relieving factors: lying down  09/27/22 SUBJECTIVE STATEMENT: Pt states that she is still having difficulty with urinary urgency and incontinence. She is having difficulty with frequency at night too, but she is trying not to get up due to it being cold. Having a very hard time with urge suppression technique and making it to he bathroom - specifically with cooking and housework.       Fluid intake: Yes:      PAIN:  Are you having pain? Low back pain Intensity: 8/10 Description: throbbing Aggravating factors: housework, prolonged standing, walking Relieving factors: limiting bending/pulling   PRECAUTIONS: None   WEIGHT BEARING RESTRICTIONS No   FALLS:  Has patient fallen in last 6 months? No   LIVING ENVIRONMENT: Lives with: lives with their family Lives in: House/apartment   OCCUPATION: not currently employed   PLOF: Independent   PATIENT GOALS decrease urinary leaking   PERTINENT HISTORY:  One c-section, bil knee pain Sexual abuse: No   BOWEL MOVEMENT Pain with bowel movement: No Type of bowel movement:Frequency every other day or two days missed and Strain Yes  Fully empty rectum: No Leakage: No Pads: No Fiber supplement: No   URINATION Pain with urination: No Fully empty bladder: No -  Stream:  WNL Urgency: Yes: when she tries to hold   Frequency: typically every hour to every hour and a half; she is not leaking at night anymore, but she is getting up at night several times Leakage: Urge to void, Walking to the bathroom, and then couple times during the night - she does feel like she is doing better at catching it on the way to the bathroom Pads: No - not helpful at night due to amount; none during the day   INTERCOURSE Pain with intercourse:  not painful, but dry Ability to have vaginal penetration:  Yes: - Climax: yes - does not do very often *feels like she has issue with itchiness - MD has ruled out yeast and other infections - she is being told that there is nothing wrong      PREGNANCY Vaginal deliveries 0 Tearing No C-section deliveries 1 Currently pregnant No   PROLAPSE None       OBJECTIVE:    PATIENT SURVEYS:    PFIQ-7 - 67   COGNITION:            Overall cognitive status: Within functional limits for tasks assessed                          SENSATION:            Light touch: Appears intact            Proprioception: Appears intact   GAIT:   Comments: antalgic gait pattern, SPC Rt LE                POSTURE: rounded shoulders and forward head      PALPATION:   General  No abdominal tenderness                 External Perineal Exam WNL                             Internal Pelvic Floor No tenderness in pelvic floor  Patient confirms identification and approves PT to assess internal pelvic floor and treatment Yes   PELVIC MMT: Pelvic floor strength 3/5 Endurance 4 seconds Repeat contractions 13x - poor motor control         TONE: WNL   PROLAPSE: Grade 1 anterior vaignal wall laxity   TODAY'S TREATMENT 10/04/22: Neuromuscular re-education: Core facilitation: Seated hip adduction with ball squeeze 15x Seated hip abduction with blue band 15x Seated march with core stability 2 x 10 D2 sword pulls 10x bil red band Self-care: Strict bladder retraining   TREATMENT  09/27/22: EVAL  Neuromuscular re-education: Pelvic floor contraction training: Quick flicks 10x Long holds 2 x 10 seconds Urge suppression technique quick flicks Self-care: Urge suppression technique review Squatty potty/relaxed toileting mechanics review       Check all possible CPT codes: 53614- Neuro Re-education and 97535 - Self Care                                         If treatment provided at initial evaluation, no treatment charged due to lack of authorization.                                PATIENT EDUCATION:  Education details: see above self-care Person educated: Patient Education method: Explanation, Demonstration, Tactile cues, Verbal cues, and Handouts Education comprehension: verbalized understanding     HOME EXERCISE PROGRAM: [id number]   ASSESSMENT:   CLINICAL IMPRESSION: Patient was able to utilize urge suppression technique with improved success over the last week demonstrated by making it to the bathroom when she had urgency. Bladder retraining performed with patient; we focused on water intake throughout the day and stopping fluid intake 3 hours before bed to help reduce fear of nocturnal enuresis episodes - she expressed that she does not allow herself to sleep deeply due to fear of these episodes even though she has not had one in several months. We discussed using urge suppression technique to achieve voiding schedule - she is to go to the bathroom at the 2/5 hour mark whether she needs to or not in order to start the process of bladder retraining. She did well with core and hip strengthening exercises being mindful of pelvic floor contraction. HEP updated. She will benefit from skilled PT intervention in order to decrease urinary frequency, improve urgency/incontinence, and improve QOL.      OBJECTIVE IMPAIRMENTS decreased activity tolerance, decreased coordination, decreased endurance, decreased strength, increased fascial restrictions, increased muscle  spasms, postural dysfunction, and pain.    ACTIVITY LIMITATIONS continence, housework, cooking   PARTICIPATION LIMITATIONS: interpersonal relationship and community activity   PERSONAL FACTORS 1 comorbidity: c-section  are also affecting patient's functional outcome.    REHAB POTENTIAL: Good   CLINICAL DECISION MAKING: Stable/uncomplicated   EVALUATION COMPLEXITY: Low     GOALS: Goals reviewed with patient? Yes   SHORT TERM GOALS: Target date: 10/25/22   Pt will be independent with HEP.    Baseline: Goal status: INITIAL   2. Pt will be able to go 2-3 hours in between voids without urgency or incontinence in order to improve QOL and perform all functional activities with less difficulty.    Baseline:  Goal status: INITIAL   3.  Pt will be independent with use of squatty potty, relaxed toileting mechanics, and improved bowel movement techniques in order  to increase ease of bowel movements and complete evacuation and improve urinary urgency.    Baseline:  Goal status: INITIAL     LONG TERM GOALS: Target date: 12/20/22   Pt will be independent with advanced HEP.    Baseline:  Goal status: INITIAL   2.  Pt will demonstrate normal pelvic floor muscle tone and A/ROM, able to achieve 4/5 strength with contractions and 10 sec endurance, in order to provide appropriate lumbopelvic support in functional activities.    Baseline:  Goal status: INITIAL   3.  Pt will report no episodes of urinary incontinence in order to improve sleeping, confidence in community activities and personal hygiene.    Baseline:  Goal status: INITIAL   4.  Pt will be able to go 2-3 hours in between voids without urgency or incontinence in order to improve QOL and perform all functional activities with less difficulty.    Baseline:  Goal status: INITIAL     PLAN: PT FREQUENCY: 1x/week   PT DURATION: 12 weeks   PLANNED INTERVENTIONS: Therapeutic exercises, Therapeutic activity, Neuromuscular  re-education, Balance training, Gait training, Patient/Family education, Joint mobilization, Dry Needling, Biofeedback, and Manual therapy   PLAN FOR NEXT SESSION: Revisit bladder retraining; pelvic floor strengthening in standing.       Julio Alm, PT, DPT11/28/2312:32 PM

## 2022-10-07 ENCOUNTER — Ambulatory Visit: Payer: Medicaid Other | Attending: Cardiology | Admitting: Cardiology

## 2022-10-07 ENCOUNTER — Encounter: Payer: Self-pay | Admitting: Cardiology

## 2022-10-07 VITALS — BP 126/74 | HR 70 | Ht 59.0 in | Wt 238.0 lb

## 2022-10-07 DIAGNOSIS — G4733 Obstructive sleep apnea (adult) (pediatric): Secondary | ICD-10-CM | POA: Diagnosis not present

## 2022-10-07 DIAGNOSIS — I2089 Other forms of angina pectoris: Secondary | ICD-10-CM | POA: Diagnosis not present

## 2022-10-07 DIAGNOSIS — R0602 Shortness of breath: Secondary | ICD-10-CM | POA: Diagnosis not present

## 2022-10-07 DIAGNOSIS — I10 Essential (primary) hypertension: Secondary | ICD-10-CM | POA: Diagnosis not present

## 2022-10-07 DIAGNOSIS — E785 Hyperlipidemia, unspecified: Secondary | ICD-10-CM

## 2022-10-07 MED ORDER — ATORVASTATIN CALCIUM 20 MG PO TABS: 20.0000 mg | ORAL_TABLET | Freq: Every day | ORAL | 3 refills | Status: DC

## 2022-10-07 NOTE — Patient Instructions (Addendum)
Medication Instructions:  Your physician recommends that you continue on your current medications as directed. Please refer to the Current Medication list given to you today.  *If you need a refill on your cardiac medications before your next appointment, please call your pharmacy*   Lab Work: TODAY: BMET  Your physician recommends that you return for a FASTING lipid profile and ALT in 6 weeks  If you have labs (blood work) drawn today and your tests are completely normal, you will receive your results only by: MyChart Message (if you have MyChart) OR A paper copy in the mail If you have any lab test that is abnormal or we need to change your treatment, we will call you to review the results.   Testing/Procedures: None   Follow-Up: At [facility], you and your health needs are our priority.  As part of our continuing mission to provide you with exceptional heart care, we have created designated Provider Care Teams.  These Care Teams include your primary Cardiologist (physician) and Advanced Practice Providers (APPs -  Physician Assistants and Nurse Practitioners) who all work together to provide you with the care you need, when you need it.  We recommend signing up for the patient portal called "MyChart".  Sign up information is provided on this After Visit Summary.  MyChart is used to connect with patients for Virtual Visits (Telemedicine).  Patients are able to view lab/test results, encounter notes, upcoming appointments, etc.  Non-urgent messages can be sent to your provider as well.   To learn more about what you can do with MyChart, go to [email or web address].    Your next appointment:   12 month(s)  The format for your next appointment:   In Person  Provider:   Armanda Magic, MD    Important Information About Sugar

## 2022-10-07 NOTE — Progress Notes (Incomplete)
Cardiology Office  Note    Date:  10/07/2022   ID:  Linda Cunningham, DOB 01-11-1966, MRN [id number]  PCP:  [name], MD  Cardiologist:  Fransico Him, MD   No chief complaint on file.   History of Present Illness:  Linda Cunningham is a 56yo obese female with a hx of HTN, GERD, OSA on PAP, Pseudotumor cerebri with a hx of Chest discomfort that she described as feeling like she had a cold in her chest.  She had an evaluation for chest pain in 2016 including LHC showing normal coronary arteries and normal LVF.   2D echo showed normal LVF with mild LVH and trivial MR.    She is here today for followup and is doing well.  She denies any chest pain or pressure, SOB, DOE, PND, orthopnea, LE edema, dizziness, palpitations or syncope. She is compliant with her meds and is tolerating meds with no SE.    Past Medical History:  Diagnosis Date  . Abnormal nuclear stress test 09/25/2015  . Chest pain 01/05/2015  . Epigastric pain   . Essential hypertension, benign 12/11/2013  . Hypertension   . Migraine   . OSA on CPAP    Followed by Dr. Sima Matas with [facility]  . Pain of upper extremity 01/07/2020  . Pseudotumor cerebri   . SOB (shortness of breath) 09/16/2016    Past Surgical History:  Procedure Laterality Date  . CARDIAC CATHETERIZATION N/A 09/25/2015   Procedure: Left Heart Cath and Coronary Angiography;  Surgeon: Peter M Martinique, MD;  Location: [place] [facility];  Service: Cardiovascular;  Laterality: N/A;  . CESAREAN SECTION      Current Medications: No outpatient medications have been marked as taking for the 10/07/22 encounter (Appointment) with Sueanne Margarita, MD.    Allergies:   Amlodipine besy-benazepril hcl, Dust mite extract, Other, Amlodipine besy-benazepril hcl, Lactose intolerance (gi), and Tilactase   Social History   Socioeconomic History  . Marital status: Single    Spouse name: Not on file  . Number of children: 1  . Years of education: Not on file   . Highest education level: Associate degree: occupational, Hotel manager, or vocational program  Occupational History    Comment: home maker  Tobacco Use  . Smoking status: Never  . Smokeless tobacco: Never  Substance and Sexual Activity  . Alcohol use: No  . Drug use: No  . Sexual activity: Not on file  Other Topics Concern  . Not on file  Social History Narrative   Lives alone   Caffeine- tea 1-2 x week   Social Determinants of Health   Financial Resource Strain: Not on file  Food Insecurity: Not on file  Transportation Needs: Not on file  Physical Activity: Not on file  Stress: Not on file  Social Connections: Not on file     Family History:  The patient's family history includes Coronary artery disease in her brother; Diabetes in her mother; Hypertension in her brother, mother, and sister; Migraines in her sister.   ROS:   Please see the history of present illness.    ROS All other systems reviewed and are negative.      No data to display             PHYSICAL EXAM:   VS:  There were no vitals taken for this visit.   GEN: Well nourished, well developed in no acute distress HEENT: Normal NECK: No JVD; No carotid bruits LYMPHATICS:  No lymphadenopathy CARDIAC:RRR, no murmurs, rubs, gallops RESPIRATORY:  Clear to auscultation without rales, wheezing or rhonchi  ABDOMEN: Soft, non-tender, non-distended MUSCULOSKELETAL:  No edema; No deformity  SKIN: Warm and dry NEUROLOGIC:  Alert and oriented x 3 PSYCHIATRIC:  Normal affect   Wt Readings from Last 3 Encounters:  09/22/22 237 lb (107.5 kg)  03/10/22 235 lb (106.6 kg)  06/29/20 214 lb (97.1 kg)      Studies/Labs Reviewed:   EKG:  EKG is ordered today.  The ekg ordered today demonstrates ***  Recent Labs: No results found for requested labs within last 365 days.   Lipid Panel    Component Value Date/Time   CHOL 124 09/03/2020 1053   TRIG 52 09/03/2020 1053   HDL 54 09/03/2020 1053   CHOLHDL 2.3  09/03/2020 1053   LDLCALC 58 09/03/2020 1053    Additional studies/ records that were reviewed today include:  Office notes from PCP    ASSESSMENT:    1. Other forms of angina pectoris   2. SOB (shortness of breath)   3. Essential hypertension, benign   4. OSA on CPAP      PLAN:  In order of problems listed above:  1.  Atypical chest pain -her descriptionin the past we not really pain but a feeling that she was getting cold in her chest -she had a normal cath in 2016 and 2D echo 01/2020 was normal except for LVH -her Protonix was increased but she still had the sensation in her chest that is nonexertional -coronary CTA 9/21 showed   2.  SOB -she still has this ? Whether related to obesity -2D echo with normal LVF and Cxray was normal -I will refer her back to Pulmonary     Medication Adjustments/Labs and Tests Ordered: Current medicines are reviewed at length with the patient today.  Concerns regarding medicines are outlined above.  Medication changes, Labs and Tests ordered today are listed in the Patient Instructions below.  There are no Patient Instructions on file for this visit.   Signed, Armanda Magic, MD  10/07/2022 12:03 AM    [facility] [address] [place], [place], [place]  [address] Phone: [phone]; Fax: [phone]

## 2022-10-07 NOTE — Progress Notes (Signed)
Cardiology Office  Note    Date:  10/07/2022   ID:  OYINKANSOLA TRUAX, DOB 27-Aug-1966, MRN [id number]  PCP:  Macy Mis, MD  Cardiologist:  Armanda Magic, MD   Chief Complaint  Patient presents with   Coronary Artery Disease   Shortness of Breath   Sleep Apnea   Hyperlipidemia   Hypertension    History of Present Illness:  Linda Cunningham is a 56yo obese female with a hx of HTN, GERD, OSA on PAP, Pseudotumor cerebri with a hx of Chest discomfort that she described as feeling like she had a cold in her chest.  She had an evaluation for chest pain in 2016 including LHC showing normal coronary arteries and normal LVF.   2D echo showed normal LVF with mild LVH and trivial MR.    She is here today for followup and is doing well.  She has had a mild cough with some mild pain in her right side when she takes a deep breath and thinks she may be getting a cold. She denies any exertional chest pain or pressure, SOB, DOE, PND, orthopnea, LE edema, dizziness, palpitations or syncope. She is compliant with her meds and is tolerating meds with no SE.    She has not been using her PAP device much because she gets claustrophobic with the mask.She has tried the FFM and nasal mask but did not like either of those.  She feels the pressure is adequate.    Past Medical History:  Diagnosis Date   Abnormal nuclear stress test 09/25/2015   Chest pain 01/05/2015   Epigastric pain    Essential hypertension, benign 12/11/2013   Hypertension    Migraine    OSA on CPAP    Followed by Dr. Catalina Lunger with [facility]   Pain of upper extremity 01/07/2020   Pseudotumor cerebri    SOB (shortness of breath) 09/16/2016    Past Surgical History:  Procedure Laterality Date   CARDIAC CATHETERIZATION N/A 09/25/2015   Procedure: Left Heart Cath and Coronary Angiography;  Surgeon: Peter M Swaziland, MD;  Location: [facility] INVASIVE CV LAB;  Service: Cardiovascular;  Laterality: N/A;   CESAREAN SECTION      Current  Medications: Current Meds  Medication Sig   acetaminophen (TYLENOL) 325 MG tablet Take 650 mg by mouth every 6 (six) hours as needed.   amLODipine (NORVASC) 5 MG tablet Take 5 mg by mouth every morning.    baclofen (LIORESAL) 10 MG tablet Take 1 tablet (10 mg total) by mouth 3 (three) times daily.   cetirizine (ZYRTEC) 10 MG tablet Take 10 mg by mouth daily.   diclofenac Sodium (VOLTAREN) 1 % GEL Apply 2 g topically 4 (four) times daily as needed.    Erenumab-aooe (AIMOVIG) 140 MG/ML SOAJ Inject 140 mg into the skin every 30 (thirty) days.   famotidine (PEPCID) 20 MG tablet Take 20 mg by mouth daily as needed for heartburn or indigestion.    fluticasone (FLONASE) 50 MCG/ACT nasal spray Place 1 spray into the nose daily as needed for allergies.   hydrocortisone 2.5 % cream Apply 1 application  topically 2 (two) times daily as needed.   ketoconazole (NIZORAL) 2 % cream Apply 1 application  topically 2 (two) times daily as needed.   loratadine (CLARITIN) 10 MG tablet Take 10 mg by mouth daily.   losartan (COZAAR) 100 MG tablet Take 100 mg by mouth every morning.    montelukast (SINGULAIR) 10 MG tablet Take 1  tablet by mouth daily.   topiramate (TOPAMAX) 100 MG tablet Take 1 tablet (100 mg total) by mouth daily.   triamcinolone cream (KENALOG) 0.1 %    [DISCONTINUED] atorvastatin (LIPITOR) 20 MG tablet Take 1 tablet (20 mg total) by mouth daily. MUST KEEP APPOINTMENT WITH DR. Mayford Knife IN DECEMBER FOR FUTURE REFILLS    Allergies:   Amlodipine besy-benazepril hcl, Dust mite extract, Other, Amlodipine besy-benazepril hcl, Lactose intolerance (gi), and Tilactase   Social History   Socioeconomic History   Marital status: Single    Spouse name: Not on file   Number of children: 1   Years of education: Not on file   Highest education level: Associate degree: occupational, Scientist, product/process development, or vocational program  Occupational History    Comment: home maker  Tobacco Use   Smoking status: Never    Smokeless tobacco: Never  Substance and Sexual Activity   Alcohol use: No   Drug use: No   Sexual activity: Not on file  Other Topics Concern   Not on file  Social History Narrative   Lives alone   Caffeine- tea 1-2 x week   Social Determinants of Health   Financial Resource Strain: Not on file  Food Insecurity: Not on file  Transportation Needs: Not on file  Physical Activity: Not on file  Stress: Not on file  Social Connections: Not on file     Family History:  The patient's family history includes Coronary artery disease in her brother; Diabetes in her mother; Hypertension in her brother, mother, and sister; Migraines in her sister.   ROS:   Please see the history of present illness.    ROS All other systems reviewed and are negative.      No data to display             PHYSICAL EXAM:   VS:  BP 126/74   Pulse 70   Ht 4\' 11"  (1.499 m)   Wt 238 lb (108 kg)   SpO2 97%   BMI 48.07 kg/m    GEN: Well nourished, well developed in no acute distress HEENT: Normal NECK: No JVD; No carotid bruits LYMPHATICS: No lymphadenopathy CARDIAC:RRR, no murmurs, rubs, gallops RESPIRATORY:  Clear to auscultation without rales, wheezing or rhonchi  ABDOMEN: Soft, non-tender, non-distended MUSCULOSKELETAL:  No edema; No deformity  SKIN: Warm and dry NEUROLOGIC:  Alert and oriented x 3 PSYCHIATRIC:  Normal affect   Wt Readings from Last 3 Encounters:  10/07/22 238 lb (108 kg)  09/22/22 237 lb (107.5 kg)  03/10/22 235 lb (106.6 kg)      Studies/Labs Reviewed:   EKG:  EKG is ordered today.  The ekg ordered today demonstrates NSR with no ST changes and low voltage QRS  Recent Labs: No results found for requested labs within last 365 days.   Lipid Panel    Component Value Date/Time   CHOL 124 09/03/2020 1053   TRIG 52 09/03/2020 1053   HDL 54 09/03/2020 1053   CHOLHDL 2.3 09/03/2020 1053   LDLCALC 58 09/03/2020 1053    Additional studies/ records that were  reviewed today include:  Office notes from PCP    ASSESSMENT:    1. Other forms of angina pectoris   2. SOB (shortness of breath)   3. Essential hypertension, benign   4. OSA on CPAP   5. Benign essential HTN      PLAN:  In order of problems listed above:  1.  Minimal ASCAD -her descriptionin the past was  not really pain but a feeling that she was getting cold in her chest which she still at times has -she had a normal cath in 2016 and 2D echo 01/2020 was normal except for LVH -coronary CTA 9/21 showed minimal pRCA, mLAD and pLCx < 25%. -She has not had any exertional sx  2.  SOB -this has resolved -2D echo with normal LVF and Cxray was normal -no significant CAD in 2021  3.  OSA -she has not been compliant with her device due to clasutrophobia with the mask -I will send her back to sleep lab for mask desensitization  4.  HLD -LDL goal < 70 -continue prescription drug management with atorvastatin 20mg  daily with PRN refills -she has not been compliant in taking her atorvastatin so I encouraged her to take it nightly -check FLP and ALT in 6 weeks  5.  HTN -BP controlled on exam today -continue prescription drug management with Amlodipine 5mg  daily and Losartan 100mg  daily with PRN refills -check BMET    Medication Adjustments/Labs and Tests Ordered: Current medicines are reviewed at length with the patient today.  Concerns regarding medicines are outlined above.  Medication changes, Labs and Tests ordered today are listed in the Patient Instructions below.  There are no Patient Instructions on file for this visit.   Signed, , MD  10/07/2022 11:38 AM    [facility] [address] [place], [place], [address]  [place] Phone: [phone]; Fax: [phone]

## 2022-10-07 NOTE — Addendum Note (Signed)
Addended by: Daleen Bo I on: 10/07/2022 12:05 PM   Modules accepted: Orders

## 2022-10-08 LAB — BASIC METABOLIC PANEL
BUN/Creatinine Ratio: 16 (ref 9–23)
BUN: 16 mg/dL (ref 6–24)
CO2: 22 mmol/L (ref 20–29)
Calcium: 9.2 mg/dL (ref 8.7–10.2)
Chloride: 105 mmol/L (ref 96–106)
Creatinine, Ser: 0.98 mg/dL (ref 0.57–1.00)
Glucose: 95 mg/dL (ref 70–99)
Potassium: 4.4 mmol/L (ref 3.5–5.2)
Sodium: 142 mmol/L (ref 134–144)
eGFR: 69 mL/min/{1.73_m2} (ref >59)

## 2022-10-19 ENCOUNTER — Telehealth: Payer: Self-pay | Admitting: Cardiology

## 2022-10-19 NOTE — Telephone Encounter (Signed)
Pt reports chest pain/discomfort, 5 out of 10, coming and going, left sided, pain is sharp.   She has also been experiencing a HA. Questionable jaw pain.  Pt advised to go  to ED for evaluation. Patient verbalized understanding and agreeable to plan.

## 2022-10-19 NOTE — Telephone Encounter (Signed)
Pt c/o of Chest Pain: STAT if CP now or developed within 24 hours  1. Are you having CP right now?  No, but a sharp pain occurred a few minutes ago   2. Are you experiencing any other symptoms (ex. SOB, nausea, vomiting, sweating)?  No   3. How long have you been experiencing CP?  Started today   4. Is your CP continuous or coming and going?  Coming and going   5. Have you taken Nitroglycerin?  Patient does not know what nitroglycerin is  ?

## 2022-10-24 ENCOUNTER — Encounter (HOSPITAL_BASED_OUTPATIENT_CLINIC_OR_DEPARTMENT_OTHER): Payer: Self-pay

## 2022-10-24 ENCOUNTER — Emergency Department (HOSPITAL_BASED_OUTPATIENT_CLINIC_OR_DEPARTMENT_OTHER): Payer: Medicaid Other | Admitting: Radiology

## 2022-10-24 ENCOUNTER — Other Ambulatory Visit: Payer: Self-pay

## 2022-10-24 DIAGNOSIS — R0789 Other chest pain: Secondary | ICD-10-CM | POA: Diagnosis not present

## 2022-10-24 DIAGNOSIS — Z79899 Other long term (current) drug therapy: Secondary | ICD-10-CM | POA: Diagnosis not present

## 2022-10-24 DIAGNOSIS — M25511 Pain in right shoulder: Secondary | ICD-10-CM | POA: Diagnosis present

## 2022-10-24 DIAGNOSIS — Y92410 Unspecified street and highway as the place of occurrence of the external cause: Secondary | ICD-10-CM | POA: Diagnosis not present

## 2022-10-24 NOTE — Telephone Encounter (Signed)
I have already received the questions and given to Dr Delena Bali to sign off on. Once signed will fax for the pt.
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
## 2022-10-24 NOTE — Telephone Encounter (Signed)
[employer] is calling. Stated she faxed form with the questions on them. Stated she needed it back soon as possible. Stated you can call and it can be done over the phone.

## 2022-10-24 NOTE — Telephone Encounter (Signed)
Faxed the question to [facility] appeal, Barrie Dunker.

## 2022-10-25 ENCOUNTER — Emergency Department (HOSPITAL_BASED_OUTPATIENT_CLINIC_OR_DEPARTMENT_OTHER): Payer: Medicaid Other | Admitting: Radiology

## 2022-10-25 ENCOUNTER — Ambulatory Visit: Payer: Medicaid Other

## 2022-10-25 ENCOUNTER — Emergency Department (HOSPITAL_BASED_OUTPATIENT_CLINIC_OR_DEPARTMENT_OTHER)
Admission: EM | Admit: 2022-10-25 | Discharge: 2022-10-25 | Disposition: A | Discharge status: Home / Self Care | Payer: Medicaid Other | Attending: Emergency Medicine | Admitting: Emergency Medicine

## 2022-10-25 DIAGNOSIS — T148XXA Other injury of unspecified body region, initial encounter: Secondary | ICD-10-CM

## 2022-10-25 DIAGNOSIS — M25511 Pain in right shoulder: Secondary | ICD-10-CM

## 2022-10-25 MED ORDER — IBUPROFEN 600 MG PO TABS: 600.0000 mg | ORAL_TABLET | Freq: Four times a day (QID) | ORAL | 0 refills | Status: DC | PRN

## 2022-10-25 MED ORDER — BACITRACIN ZINC 500 UNIT/GM EX OINT
TOPICAL_OINTMENT | Freq: Two times a day (BID) | CUTANEOUS | Status: DC
  Administered 2022-10-25: 1 via TOPICAL
  Filled 2022-10-25: qty 28.35

## 2022-10-25 MED ORDER — KETOROLAC TROMETHAMINE 30 MG/ML IJ SOLN
30.0000 mg | Freq: Once | INTRAMUSCULAR | Status: AC
  Administered 2022-10-25: 30 mg via INTRAMUSCULAR
  Filled 2022-10-25: qty 1

## 2022-10-25 MED ORDER — CYCLOBENZAPRINE HCL 5 MG PO TABS: 5.0000 mg | ORAL_TABLET | Freq: Two times a day (BID) | ORAL | 0 refills | Status: AC | PRN
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
## 2022-11-08 ENCOUNTER — Ambulatory Visit: Payer: Medicaid Other

## 2022-11-15 ENCOUNTER — Ambulatory Visit: Payer: Medicaid Other | Attending: Family Medicine

## 2022-11-15 DIAGNOSIS — M5459 Other low back pain: Secondary | ICD-10-CM | POA: Insufficient documentation

## 2022-11-15 DIAGNOSIS — R279 Unspecified lack of coordination: Secondary | ICD-10-CM

## 2022-11-15 DIAGNOSIS — M6281 Muscle weakness (generalized): Secondary | ICD-10-CM | POA: Diagnosis present

## 2022-11-15 DIAGNOSIS — M62838 Other muscle spasm: Secondary | ICD-10-CM | POA: Diagnosis present

## 2022-11-15 DIAGNOSIS — R293 Abnormal posture: Secondary | ICD-10-CM | POA: Diagnosis present
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
## 2022-11-18 ENCOUNTER — Ambulatory Visit: Payer: Medicaid Other | Attending: Cardiology

## 2022-11-18 DIAGNOSIS — I2089 Other forms of angina pectoris: Secondary | ICD-10-CM

## 2022-11-18 DIAGNOSIS — I10 Essential (primary) hypertension: Secondary | ICD-10-CM

## 2022-11-18 DIAGNOSIS — G4733 Obstructive sleep apnea (adult) (pediatric): Secondary | ICD-10-CM

## 2022-11-18 DIAGNOSIS — E785 Hyperlipidemia, unspecified: Secondary | ICD-10-CM

## 2022-11-18 DIAGNOSIS — R0602 Shortness of breath: Secondary | ICD-10-CM

## 2022-11-18 LAB — LIPID PANEL
Chol/HDL Ratio: 2.2 ratio (ref 0.0–4.4)
Cholesterol, Total: 131 mg/dL (ref 100–199)
HDL: 60 mg/dL (ref >39)
LDL Chol Calc (NIH): 55 mg/dL (ref 0–99)
Triglycerides: 83 mg/dL (ref 0–149)
VLDL Cholesterol Cal: 16 mg/dL (ref 5–40)

## 2022-11-18 LAB — ALT: ALT: 20 IU/L (ref 0–32)

## 2022-11-21 ENCOUNTER — Telehealth: Payer: Self-pay | Admitting: Cardiology

## 2022-11-21 ENCOUNTER — Other Ambulatory Visit: Payer: Medicaid Other

## 2022-11-21 NOTE — Telephone Encounter (Signed)
-----  Message from Sueanne Margarita, MD sent at 11/20/2022  6:53 PM EST ----- Lipids at goal continue current therapy and forward to PCP

## 2022-11-21 NOTE — Telephone Encounter (Signed)
The patient has been notified of the result and verbalized understanding.  All questions (if any) were answered.   Will forward a copy to the pts PCP on file.

## 2022-11-21 NOTE — Telephone Encounter (Signed)
Pt is returning call in regards to labs. Requesting call back.  

## 2022-11-22 ENCOUNTER — Ambulatory Visit: Payer: Medicaid Other

## 2022-11-22 DIAGNOSIS — R293 Abnormal posture: Secondary | ICD-10-CM

## 2022-11-22 DIAGNOSIS — M62838 Other muscle spasm: Secondary | ICD-10-CM

## 2022-11-22 DIAGNOSIS — R279 Unspecified lack of coordination: Secondary | ICD-10-CM

## 2022-11-22 DIAGNOSIS — M6281 Muscle weakness (generalized): Secondary | ICD-10-CM

## 2022-11-22 NOTE — Therapy (Signed)
OUTPATIENT PHYSICAL THERAPY TREATMENT NOTE   Patient Name: Linda Cunningham MRN: [id number] DOB:August 16, 1966, 57 y.o., female Today's Date: 11/22/2022  PCP: Macy Mis, MD REFERRING PROVIDER: Macy Mis, MD  END OF SESSION:   PT End of Session - 11/22/22 0942     Visit Number 4    Date for PT Re-Evaluation 12/20/22    Authorization Type [employer] Medicaid    PT Start Time 0940    PT Stop Time 1012    PT Time Calculation (min) 32 min    Activity Tolerance Patient tolerated treatment well    Behavior During Therapy [facility] for tasks assessed/performed               Past Medical History:  Diagnosis Date   Abnormal nuclear stress test 09/25/2015   Chest pain 01/05/2015   Epigastric pain    Essential hypertension, benign 12/11/2013   Hypertension    Migraine    OSA on CPAP    Followed by Dr. Catalina Lunger with [facility]   Pain of upper extremity 01/07/2020   Pseudotumor cerebri    SOB (shortness of breath) 09/16/2016   Past Surgical History:  Procedure Laterality Date   CARDIAC CATHETERIZATION N/A 09/25/2015   Procedure: Left Heart Cath and Coronary Angiography;  Surgeon: Peter M Sw[place], MD;  Location: [facility] INVASIVE CV LAB;  Service: Cardiovascular;  Laterality: N/A;   CESAREAN SECTION     Patient Active Problem List   Diagnosis Date Noted   Pain of upper extremity 01/07/2020   SOB (shortness of breath) 09/16/2016   OSA on CPAP    Abnormal nuclear stress test 09/25/2015   Chest pain 01/05/2015   Essential hypertension, benign 12/11/2013    REFERRING DIAG: N39.41 (ICD-10-CM) - Urge incontinence  THERAPY DIAG:  Abnormal posture  Muscle weakness (generalized)  Other muscle spasm  Unspecified lack of coordination  Rationale for Evaluation and Treatment Rehabilitation  PERTINENT HISTORY: One c-section, bil knee pain  PRECAUTIONS: NA  SUBJECTIVE:                                                                                                                                                                                       SUBJECTIVE STATEMENT:  Pt states that she has been doing a little too much around the house and she has had more low back pain. She is trying to work on timing of fluids and drinking more, but has a difficult time with this. She urinated 2x last night. She has had one episode of incontinence in the last week.    PAIN:  Are you having pain? Yes: NPRS scale: 3/10 Pain location: low back  Pain description: tight Aggravating factors: worse in morning, standing Relieving factors: lying down  09/27/22 SUBJECTIVE STATEMENT: Pt states that she is still having difficulty with urinary urgency and incontinence. She is having difficulty with frequency at night too, but she is trying not to get up due to it being cold. Having a very hard time with urge suppression technique and making it to he bathroom - specifically with cooking and housework.       Fluid intake: Yes:      PAIN:  Are you having pain? Low back pain Intensity: 8/10 Description: throbbing Aggravating factors: housework, prolonged standing, walking Relieving factors: limiting bending/pulling   PRECAUTIONS: None   WEIGHT BEARING RESTRICTIONS No   FALLS:  Has patient fallen in last 6 months? No   LIVING ENVIRONMENT: Lives with: lives with their family Lives in: House/apartment   OCCUPATION: not currently employed   PLOF: Independent   PATIENT GOALS decrease urinary leaking   PERTINENT HISTORY:  One c-section, bil knee pain Sexual abuse: No   BOWEL MOVEMENT Pain with bowel movement: No Type of bowel movement:Frequency every other day or two days missed and Strain Yes  Fully empty rectum: No Leakage: No Pads: No Fiber supplement: No   URINATION Pain with urination: No Fully empty bladder: No -  Stream:  WNL Urgency: Yes: when she tries to hold  Frequency: typically every hour to every hour and a half; she is not leaking at night anymore, but she is  getting up at night several times Leakage: Urge to void, Walking to the bathroom, and then couple times during the night - she does feel like she is doing better at catching it on the way to the bathroom Pads: No - not helpful at night due to amount; none during the day   INTERCOURSE Pain with intercourse:  not painful, but dry Ability to have vaginal penetration:  Yes: - Climax: yes - does not do very often *feels like she has issue with itchiness - MD has ruled out yeast and other infections - she is being told that there is nothing wrong      PREGNANCY Vaginal deliveries 0 Tearing No C-section deliveries 1 Currently pregnant No   PROLAPSE None       OBJECTIVE:    PATIENT SURVEYS:    PFIQ-7 - 67   COGNITION:            Overall cognitive status: Within functional limits for tasks assessed                          SENSATION:            Light touch: Appears intact            Proprioception: Appears intact   GAIT:   Comments: antalgic gait pattern, SPC Rt LE                POSTURE: rounded shoulders and forward head      PALPATION:   General  No abdominal tenderness                 External Perineal Exam WNL                             Internal Pelvic Floor No tenderness in pelvic floor   Patient confirms identification and approves PT to assess internal pelvic floor and treatment Yes   PELVIC MMT:  Pelvic floor strength 3/5 Endurance 4 seconds Repeat contractions 13x - poor motor control         TONE: WNL   PROLAPSE: Grade 1 anterior vaignal wall laxity   TODAY'S TREATMENT 11/22/22 Neuromuscular re-education: Supine D2 with feet elevated on swiss ball, 2lbs ball, 2 x 10 bil Exercises: Bridge with red band around thighs 2 x 10 Supine piriformis stretch 60 sec bil Supine hamstring stretch 60 sec bil Seated hip adduction ball squeeze 10 x 5 seconds Seated abduction blue band 10 x 5 seconds Therapeutic activities: 3 way kick 10x bil  Sit>stand 10x  5lb KB Seated deadlift 10x    TREATMENT 11/15/22 Neuromuscular re-education: Core facilitation: Seated hip adduction with ball squeeze 15x Seated hip abduction with blue band 15x Seated march with core stability 2 x 10 D2 sword pulls 10x bil red band Quick flicks in seated 20x Long holds in seated 5 x 10 seconds    TREATMENT 10/04/22: Neuromuscular re-education: Core facilitation: Seated hip adduction with ball squeeze 15x Seated hip abduction with blue band 15x Seated march with core stability 2 x 10 D2 sword pulls 10x bil red band Self-care: Strict bladder retraining     Check all possible CPT codes: 54098- Neuro Re-education and [phone] - Self Care                                         If treatment provided at initial evaluation, no treatment charged due to lack of authorization.                                PATIENT EDUCATION:  Education details: see above self-care Person educated: Patient Education method: Explanation, Demonstration, Tactile cues, Verbal cues, and Handouts Education comprehension: verbalized understanding     HOME EXERCISE PROGRAM: [id number]   ASSESSMENT:   CLINICAL IMPRESSION: Pt is doing well with urinary urgency/incontinence with only one episode of leaking over the last week. She did urinate 2x overnight, but had water before bed and has difficulty sleeping for other reasons as well. Believe addressing sleep hygiene will be helpful. She did well with stretches/mobility, familiar exercises, and progressions to functional strengthening this session with minimal back pain and improvements in understanding breath coordination and better form. She will continue to benefit from skilled PT intervention in order to decrease urinary frequency, improve urgency/incontinence, and improve QOL.      OBJECTIVE IMPAIRMENTS decreased activity tolerance, decreased coordination, decreased endurance, decreased strength, increased fascial restrictions, increased  muscle spasms, postural dysfunction, and pain.    ACTIVITY LIMITATIONS continence, housework, cooking   PARTICIPATION LIMITATIONS: interpersonal relationship and community activity   PERSONAL FACTORS 1 comorbidity: c-section  are also affecting patient's functional outcome.    REHAB POTENTIAL: Good   CLINICAL DECISION MAKING: Stable/uncomplicated   EVALUATION COMPLEXITY: Low     GOALS: Goals reviewed with patient? Yes   SHORT TERM GOALS: Target date: 10/25/22 - updated 11/15/22   Pt will be independent with HEP.    Baseline: Goal status: MET 11/15/22   2. Pt will be able to go 2-3 hours in between voids without urgency or incontinence in order to improve QOL and perform all functional activities with less difficulty.    Baseline:  Goal status: IN PROGRESS   3.  Pt will be independent with use of squatty potty, relaxed  toileting mechanics, and improved bowel movement techniques in order to increase ease of bowel movements and complete evacuation and improve urinary urgency.    Baseline:  Goal status: IN PROGRESS     LONG TERM GOALS: Target date: 12/20/22 - updated 11/15/22   Pt will be independent with advanced HEP.    Baseline:  Goal status: IN PROGRESS   2.  Pt will demonstrate normal pelvic floor muscle tone and A/ROM, able to achieve 4/5 strength with contractions and 10 sec endurance, in order to provide appropriate lumbopelvic support in functional activities.    Baseline:  Goal status: IN PROGRESS   3.  Pt will report no episodes of urinary incontinence in order to improve sleeping, confidence in community activities and personal hygiene.    Baseline:  Goal status: IN PROGRESS   4.  Pt will be able to go 2-3 hours in between voids without urgency or incontinence in order to improve QOL and perform all functional activities with less difficulty.    Baseline:  Goal status: IN PROGRESS     PLAN: PT FREQUENCY: 1x/week   PT DURATION: 12 weeks   PLANNED  INTERVENTIONS: Therapeutic exercises, Therapeutic activity, Neuromuscular re-education, Balance training, Gait training, Patient/Family education, Joint mobilization, Dry Needling, Biofeedback, and Manual therapy   PLAN FOR NEXT SESSION: Revisit bladder retraining; pelvic floor strengthening in standing - sidestepping, lateral step up, single leg hip hinge, heel raises.        Heather Roberts, PT, DPT01/16/2410:22 AM

## 2022-11-29 ENCOUNTER — Ambulatory Visit: Payer: Medicaid Other

## 2022-11-29 DIAGNOSIS — M62838 Other muscle spasm: Secondary | ICD-10-CM

## 2022-11-29 DIAGNOSIS — R293 Abnormal posture: Secondary | ICD-10-CM

## 2022-11-29 DIAGNOSIS — M5459 Other low back pain: Secondary | ICD-10-CM

## 2022-11-29 DIAGNOSIS — R279 Unspecified lack of coordination: Secondary | ICD-10-CM

## 2022-11-29 DIAGNOSIS — M6281 Muscle weakness (generalized): Secondary | ICD-10-CM

## 2022-11-29 NOTE — Therapy (Signed)
OUTPATIENT PHYSICAL THERAPY TREATMENT NOTE   Patient Name: Linda Cunningham MRN: [id number] DOB:Aug 22, 1966, 57 y.o., female Today's Date: 11/29/2022  PCP: Macy Mis, MD REFERRING PROVIDER: Macy Mis, MD  END OF SESSION:   PT End of Session - 11/29/22 1152     Visit Number 5    Date for PT Re-Evaluation 12/20/22    Authorization Type [employer] Medicaid    PT Start Time 1150    PT Stop Time 1228    PT Time Calculation (min) 38 min    Activity Tolerance Patient tolerated treatment well    Behavior During Therapy [facility] for tasks assessed/performed                Past Medical History:  Diagnosis Date   Abnormal nuclear stress test 09/25/2015   Chest pain 01/05/2015   Epigastric pain    Essential hypertension, benign 12/11/2013   Hypertension    Migraine    OSA on CPAP    Followed by Dr. Catalina Lunger with [facility]   Pain of upper extremity 01/07/2020   Pseudotumor cerebri    SOB (shortness of breath) 09/16/2016   Past Surgical History:  Procedure Laterality Date   CARDIAC CATHETERIZATION N/A 09/25/2015   Procedure: Left Heart Cath and Coronary Angiography;  Surgeon: Peter M Swaziland, MD;  Location: [facility] INVASIVE CV LAB;  Service: Cardiovascular;  Laterality: N/A;   CESAREAN SECTION     Patient Active Problem List   Diagnosis Date Noted   Pain of upper extremity 01/07/2020   SOB (shortness of breath) 09/16/2016   OSA on CPAP    Abnormal nuclear stress test 09/25/2015   Chest pain 01/05/2015   Essential hypertension, benign 12/11/2013    REFERRING DIAG: N39.41 (ICD-10-CM) - Urge incontinence  THERAPY DIAG:  Abnormal posture  Muscle weakness (generalized)  Other muscle spasm  Unspecified lack of coordination  Other low back pain  Rationale for Evaluation and Treatment Rehabilitation  PERTINENT HISTORY: One c-section, bil knee pain  PRECAUTIONS: NA  SUBJECTIVE:                                                                                                                                                                                       SUBJECTIVE STATEMENT:  Pt has had no leaks since last visit. She had some urgency last night when cooking. She is still voiding 2x/night when she is already up and is struggling with being thirsty and drinking water at night.    PAIN:  Are you having pain? Yes: NPRS scale: 3/10 Pain location: low back Pain description: tight Aggravating factors: worse in morning, standing Relieving factors: lying down  09/27/22 SUBJECTIVE STATEMENT: Pt states that she is still having difficulty with urinary urgency and incontinence. She is having difficulty with frequency at night too, but she is trying not to get up due to it being cold. Having a very hard time with urge suppression technique and making it to he bathroom - specifically with cooking and housework.       Fluid intake: Yes:      PAIN:  Are you having pain? Low back pain Intensity: 8/10 Description: throbbing Aggravating factors: housework, prolonged standing, walking Relieving factors: limiting bending/pulling   PRECAUTIONS: None   WEIGHT BEARING RESTRICTIONS No   FALLS:  Has patient fallen in last 6 months? No   LIVING ENVIRONMENT: Lives with: lives with their family Lives in: House/apartment   OCCUPATION: not currently employed   PLOF: Independent   PATIENT GOALS decrease urinary leaking   PERTINENT HISTORY:  One c-section, bil knee pain Sexual abuse: No   BOWEL MOVEMENT Pain with bowel movement: No Type of bowel movement:Frequency every other day or two days missed and Strain Yes  Fully empty rectum: No Leakage: No Pads: No Fiber supplement: No   URINATION Pain with urination: No Fully empty bladder: No -  Stream:  WNL Urgency: Yes: when she tries to hold  Frequency: typically every hour to every hour and a half; she is not leaking at night anymore, but she is getting up at night several times Leakage: Urge to void,  Walking to the bathroom, and then couple times during the night - she does feel like she is doing better at catching it on the way to the bathroom Pads: No - not helpful at night due to amount; none during the day   INTERCOURSE Pain with intercourse:  not painful, but dry Ability to have vaginal penetration:  Yes: - Climax: yes - does not do very often *feels like she has issue with itchiness - MD has ruled out yeast and other infections - she is being told that there is nothing wrong      PREGNANCY Vaginal deliveries 0 Tearing No C-section deliveries 1 Currently pregnant No   PROLAPSE None       OBJECTIVE:    PATIENT SURVEYS:    PFIQ-7 - 67   COGNITION:            Overall cognitive status: Within functional limits for tasks assessed                          SENSATION:            Light touch: Appears intact            Proprioception: Appears intact   GAIT:   Comments: antalgic gait pattern, SPC Rt LE                POSTURE: rounded shoulders and forward head      PALPATION:   General  No abdominal tenderness                 External Perineal Exam WNL                             Internal Pelvic Floor No tenderness in pelvic floor   Patient confirms identification and approves PT to assess internal pelvic floor and treatment Yes   PELVIC MMT: Pelvic floor strength 3/5 Endurance 4 seconds Repeat contractions 13x - poor motor control  TONE: WNL   PROLAPSE: Grade 1 anterior vaignal wall laxity   TODAY'S TREATMENT 11/29/22 Neuromuscular re-education: Supine D2 with feet elevated on swiss ball, 2lbs ball, 2 x 10 bil Supine lower trunk rotation with legs elevated on red ball 2 x 10 Supine ball press for core activation 2 x 10 Supine heel digs/slides on ball 10x bil Seated hip IR 2 x 10 yellow loop band Therapeutic activities: March 2 x 10, 2 lb weight hold Sit>stand 2 x 10, 10lb KB Seated deadlift 3 x 10, 10lb kettle bell Unilateral row 2 x 10  bil, green band Sidestepping 3 laps, holding 10 lb kettle bell   TREATMENT 11/22/22 Neuromuscular re-education: Supine D2 with feet elevated on swiss ball, 2lbs ball, 2 x 10 bil Exercises: Bridge with red band around thighs 2 x 10 Supine piriformis stretch 60 sec bil Supine hamstring stretch 60 sec bil Seated hip adduction ball squeeze 10 x 5 seconds Seated abduction blue band 10 x 5 seconds Therapeutic activities: 3 way kick 10x bil  Sit>stand 10x 5lb KB Seated deadlift 10x    TREATMENT 11/15/22 Neuromuscular re-education: Core facilitation: Seated hip adduction with ball squeeze 15x Seated hip abduction with blue band 15x Seated march with core stability 2 x 10 D2 sword pulls 10x bil red band Quick flicks in seated 20x Long holds in seated 5 x 10 seconds       PATIENT EDUCATION:  Education details: see above self-care Person educated: Patient Education method: Explanation, Demonstration, Tactile cues, Verbal cues, and Handouts Education comprehension: verbalized understanding     HOME EXERCISE PROGRAM: [id number]   ASSESSMENT:   CLINICAL IMPRESSION: Pt has seen some good progress over the last week with no leaks. She is still having some urgency when she gets distracted and does not go to the bathroom. She was able to progress many exercises this session to include more standing and functional strengthening. She is showing good progress with breath coordination and stability of low back and pelvis. Believe this will continue to help pelvic floor. She will continue to benefit from skilled PT intervention in order to decrease urinary frequency, improve urgency/incontinence, and improve QOL.      OBJECTIVE IMPAIRMENTS decreased activity tolerance, decreased coordination, decreased endurance, decreased strength, increased fascial restrictions, increased muscle spasms, postural dysfunction, and pain.    ACTIVITY LIMITATIONS continence, housework, cooking   PARTICIPATION  LIMITATIONS: interpersonal relationship and community activity   PERSONAL FACTORS 1 comorbidity: c-section  are also affecting patient's functional outcome.    REHAB POTENTIAL: Good   CLINICAL DECISION MAKING: Stable/uncomplicated   EVALUATION COMPLEXITY: Low     GOALS: Goals reviewed with patient? Yes   SHORT TERM GOALS: Target date: 10/25/22 - updated 11/15/22   Pt will be independent with HEP.    Baseline: Goal status: MET 11/15/22   2. Pt will be able to go 2-3 hours in between voids without urgency or incontinence in order to improve QOL and perform all functional activities with less difficulty.    Baseline:  Goal status: IN PROGRESS   3.  Pt will be independent with use of squatty potty, relaxed toileting mechanics, and improved bowel movement techniques in order to increase ease of bowel movements and complete evacuation and improve urinary urgency.    Baseline:  Goal status: IN PROGRESS     LONG TERM GOALS: Target date: 12/20/22 - updated 11/15/22   Pt will be independent with advanced HEP.    Baseline:  Goal status: IN PROGRESS  2.  Pt will demonstrate normal pelvic floor muscle tone and A/ROM, able to achieve 4/5 strength with contractions and 10 sec endurance, in order to provide appropriate lumbopelvic support in functional activities.    Baseline:  Goal status: IN PROGRESS   3.  Pt will report no episodes of urinary incontinence in order to improve sleeping, confidence in community activities and personal hygiene.    Baseline:  Goal status: IN PROGRESS   4.  Pt will be able to go 2-3 hours in between voids without urgency or incontinence in order to improve QOL and perform all functional activities with less difficulty.    Baseline:  Goal status: IN PROGRESS     PLAN: PT FREQUENCY: 1x/week   PT DURATION: 12 weeks   PLANNED INTERVENTIONS: Therapeutic exercises, Therapeutic activity, Neuromuscular re-education, Balance training, Gait training,  Patient/Family education, Joint mobilization, Dry Needling, Biofeedback, and Manual therapy   PLAN FOR NEXT SESSION: Revisit bladder retraining; pelvic floor strengthening in standing -  lateral step up, single leg hip hinge, heel raises. Mobility/stretches.       Heather Roberts, PT, DPT01/23/2412:28 PM

## 2022-11-30 ENCOUNTER — Ambulatory Visit: Payer: Medicaid Other | Attending: Family Medicine

## 2022-11-30 ENCOUNTER — Other Ambulatory Visit: Payer: Self-pay

## 2022-11-30 DIAGNOSIS — M62838 Other muscle spasm: Secondary | ICD-10-CM

## 2022-11-30 DIAGNOSIS — M5459 Other low back pain: Secondary | ICD-10-CM | POA: Insufficient documentation

## 2022-11-30 DIAGNOSIS — R262 Difficulty in walking, not elsewhere classified: Secondary | ICD-10-CM | POA: Insufficient documentation

## 2022-11-30 DIAGNOSIS — R293 Abnormal posture: Secondary | ICD-10-CM | POA: Insufficient documentation

## 2022-11-30 DIAGNOSIS — M6281 Muscle weakness (generalized): Secondary | ICD-10-CM | POA: Insufficient documentation

## 2022-11-30 DIAGNOSIS — M170 Bilateral primary osteoarthritis of knee: Secondary | ICD-10-CM

## 2022-11-30 DIAGNOSIS — M79604 Pain in right leg: Secondary | ICD-10-CM | POA: Diagnosis present
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
## 2022-12-08 ENCOUNTER — Ambulatory Visit: Payer: Medicaid Other

## 2022-12-14 ENCOUNTER — Ambulatory Visit: Payer: Medicaid Other | Attending: Family Medicine

## 2022-12-14 DIAGNOSIS — M5459 Other low back pain: Secondary | ICD-10-CM | POA: Diagnosis present

## 2022-12-14 DIAGNOSIS — M6281 Muscle weakness (generalized): Secondary | ICD-10-CM | POA: Insufficient documentation

## 2022-12-14 DIAGNOSIS — R279 Unspecified lack of coordination: Secondary | ICD-10-CM | POA: Insufficient documentation

## 2022-12-14 DIAGNOSIS — M79604 Pain in right leg: Secondary | ICD-10-CM | POA: Diagnosis present

## 2022-12-14 DIAGNOSIS — R262 Difficulty in walking, not elsewhere classified: Secondary | ICD-10-CM | POA: Diagnosis present

## 2022-12-14 DIAGNOSIS — M62838 Other muscle spasm: Secondary | ICD-10-CM | POA: Insufficient documentation

## 2022-12-14 DIAGNOSIS — M170 Bilateral primary osteoarthritis of knee: Secondary | ICD-10-CM | POA: Insufficient documentation

## 2022-12-14 DIAGNOSIS — R293 Abnormal posture: Secondary | ICD-10-CM | POA: Insufficient documentation

## 2022-12-14 NOTE — Therapy (Signed)
OUTPATIENT PHYSICAL THERAPY TREATMENT NOTE   Patient Name: Linda Cunningham MRN: [id number] DOB:[birth year], 57 y.o., female Today's Date: 12/14/2022  PCP: [name], MD REFERRING PROVIDER: [name], MD  END OF SESSION:   PT End of Session - 12/14/22 1451     Visit Number 7    Date for PT Re-Evaluation 12/20/22    Authorization Type [employer] Medicaid    PT Start Time 0102    PT Stop Time 1525    PT Time Calculation (min) 40 min    Activity Tolerance Patient tolerated treatment well    Behavior During Therapy [facility] for tasks assessed/performed                 Past Medical History:  Diagnosis Date   Abnormal nuclear stress test 09/25/2015   Chest pain 01/05/2015   Epigastric pain    Essential hypertension, benign 12/11/2013   Hypertension    Migraine    OSA on CPAP    Followed by Dr. Sima Matas with [place]   Pain of upper extremity 01/07/2020   Pseudotumor cerebri    SOB (shortness of breath) 09/16/2016   Past Surgical History:  Procedure Laterality Date   CARDIAC CATHETERIZATION N/A 09/25/2015   Procedure: Left Heart Cath and Coronary Angiography;  Surgeon: Peter M Martinique, MD;  Location: [place] [facility];  Service: Cardiovascular;  Laterality: N/A;   CESAREAN SECTION     Patient Active Problem List   Diagnosis Date Noted   Pain of upper extremity 01/07/2020   SOB (shortness of breath) 09/16/2016   OSA on CPAP    Abnormal nuclear stress test 09/25/2015   Chest pain 01/05/2015   Essential hypertension, benign 12/11/2013    REFERRING DIAG: N39.41 (ICD-10-CM) - Urge incontinence  THERAPY DIAG:  Abnormal posture  Other low back pain  Muscle weakness (generalized)  Rationale for Evaluation and Treatment Rehabilitation  PERTINENT HISTORY: One c-section, bil knee pain  PRECAUTIONS: NA  SUBJECTIVE:                                                                                                                                                                                       SUBJECTIVE STATEMENT: Pt states that she is still not doing well with sleep hygiene. She states that she had one incident of running to the bathroom yesterday when she was cooking, but states that she did make it without any leaking. She reports only one possible small leak in the last 2 weeks. Pt states that she does have sleep apnea machine but she will not use due to discomfort. She states that the biggest reason that she does  not sleep through the night is because she is afraid of episode of nocturnal enuresis.     PAIN:  Are you having pain? Yes: NPRS scale: 3/10 Pain location: low back Pain description: tight Aggravating factors: worse in morning, standing Relieving factors: lying down  09/27/22 SUBJECTIVE STATEMENT: Pt states that she is still having difficulty with urinary urgency and incontinence. She is having difficulty with frequency at night too, but she is trying not to get up due to it being cold. Having a very hard time with urge suppression technique and making it to he bathroom - specifically with cooking and housework.       Fluid intake: Yes:      PAIN:  Are you having pain? Low back pain Intensity: 8/10 Description: throbbing Aggravating factors: housework, prolonged standing, walking Relieving factors: limiting bending/pulling   PRECAUTIONS: None   WEIGHT BEARING RESTRICTIONS No   FALLS:  Has patient fallen in last 6 months? No   LIVING ENVIRONMENT: Lives with: lives with their family Lives in: House/apartment   OCCUPATION: not currently employed   PLOF: Independent   PATIENT GOALS decrease urinary leaking   PERTINENT HISTORY:  One c-section, bil knee pain Sexual abuse: No   BOWEL MOVEMENT Pain with bowel movement: No Type of bowel movement:Frequency every other day or two days missed and Strain Yes  Fully empty rectum: No Leakage: No Pads: No Fiber supplement: No   URINATION Pain with urination: No Fully  empty bladder: No -  Stream:  WNL Urgency: Yes: when she tries to hold  Frequency: typically every hour to every hour and a half; she is not leaking at night anymore, but she is getting up at night several times Leakage: Urge to void, Walking to the bathroom, and then couple times during the night - she does feel like she is doing better at catching it on the way to the bathroom Pads: No - not helpful at night due to amount; none during the day   INTERCOURSE Pain with intercourse:  not painful, but dry Ability to have vaginal penetration:  Yes: - Climax: yes - does not do very often *feels like she has issue with itchiness - MD has ruled out yeast and other infections - she is being told that there is nothing wrong      PREGNANCY Vaginal deliveries 0 Tearing No C-section deliveries 1 Currently pregnant No   PROLAPSE None       OBJECTIVE:    PATIENT SURVEYS:    PFIQ-7 - 67   COGNITION:            Overall cognitive status: Within functional limits for tasks assessed                          SENSATION:            Light touch: Appears intact            Proprioception: Appears intact   GAIT:   Comments: antalgic gait pattern, SPC Rt LE                POSTURE: rounded shoulders and forward head      PALPATION:   General  No abdominal tenderness                 External Perineal Exam WNL  Internal Pelvic Floor No tenderness in pelvic floor   Patient confirms identification and approves PT to assess internal pelvic floor and treatment Yes   PELVIC MMT: Pelvic floor strength 3/5 Endurance 4 seconds Repeat contractions 13x - poor motor control         TONE: WNL   PROLAPSE: Grade 1 anterior vaignal wall laxity   TODAY'S TREATMENT 12/14/22 Neuromuscular re-education: Balance board activities Therapeutic activities: Wearing pads/briefs at night to help increase your confidence with sleeping through the night in order to get better  rest Pt is working on going to the bathroom every 2.5-3 hours Stopping fluid intake 3 hours before bed and not drinking when she wakes over night - increasing water intake throughout the day   TREATMENT 11/29/22 Neuromuscular re-education: Supine D2 with feet elevated on swiss ball, 2lbs ball, 2 x 10 bil Supine lower trunk rotation with legs elevated on red ball 2 x 10 Supine ball press for core activation 2 x 10 Supine heel digs/slides on ball 10x bil Seated hip IR 2 x 10 yellow loop band Therapeutic activities: March 2 x 10, 2 lb weight hold Sit>stand 2 x 10, 10lb KB Seated deadlift 3 x 10, 10lb kettle bell Unilateral row 2 x 10 bil, green band Sidestepping 3 laps, holding 10 lb kettle bell   TREATMENT 11/22/22 Neuromuscular re-education: Supine D2 with feet elevated on swiss ball, 2lbs ball, 2 x 10 bil Exercises: Bridge with red band around thighs 2 x 10 Supine piriformis stretch 60 sec bil Supine hamstring stretch 60 sec bil Seated hip adduction ball squeeze 10 x 5 seconds Seated abduction blue band 10 x 5 seconds Therapeutic activities: 3 way kick 10x bil  Sit>stand 10x 5lb KB Seated deadlift 10x       PATIENT EDUCATION:  Education details: see above self-care Person educated: Patient Education method: Explanation, Demonstration, Tactile cues, Verbal cues, and Handouts Education comprehension: verbalized understanding     HOME EXERCISE PROGRAM: [email or web address]   ASSESSMENT:   CLINICAL IMPRESSION: Pt has made excellent improvements with urinary leaking and urgency. However, she has still not been able to implement some change sthat will help resolve her remaining dysfunction, such as increasing water intake throughout the day, decreasing night time fluids, getting better rest through the night, and timing voiding intervals during the day. She is not sleeping through the night due to fear of having episode of nocturnal enuresis (original reason for coming to [facility]). We  reviewed that she has not had episode in almost a year since starting PT; however, in order to build confidence and sleep through the night, suggested using briefs or pads. She will continue to benefit from skilled PT intervention in order to decrease urinary frequency, improve urgency/incontinence, and improve QOL.      OBJECTIVE IMPAIRMENTS decreased activity tolerance, decreased coordination, decreased endurance, decreased strength, increased fascial restrictions, increased muscle spasms, postural dysfunction, and pain.    ACTIVITY LIMITATIONS continence, housework, cooking   PARTICIPATION LIMITATIONS: interpersonal relationship and community activity   PERSONAL FACTORS 1 comorbidity: c-section  are also affecting patient's functional outcome.    REHAB POTENTIAL: Good   CLINICAL DECISION MAKING: Stable/uncomplicated   EVALUATION COMPLEXITY: Low     GOALS: Goals reviewed with patient? Yes   SHORT TERM GOALS: Target date: 10/25/22 - updated 11/15/22   Pt will be independent with HEP.    Baseline: Goal status: MET 11/15/22   2. Pt will be able to go 2-3 hours in between voids without urgency or  incontinence in order to improve QOL and perform all functional activities with less difficulty.    Baseline:  Goal status: IN PROGRESS   3.  Pt will be independent with use of squatty potty, relaxed toileting mechanics, and improved bowel movement techniques in order to increase ease of bowel movements and complete evacuation and improve urinary urgency.    Baseline:  Goal status: IN PROGRESS     LONG TERM GOALS: Target date: 12/20/22 - updated 11/15/22   Pt will be independent with advanced HEP.    Baseline:  Goal status: IN PROGRESS   2.  Pt will demonstrate normal pelvic floor muscle tone and A/ROM, able to achieve 4/5 strength with contractions and 10 sec endurance, in order to provide appropriate lumbopelvic support in functional activities.    Baseline:  Goal status: IN  PROGRESS   3.  Pt will report no episodes of urinary incontinence in order to improve sleeping, confidence in community activities and personal hygiene.    Baseline:  Goal status: IN PROGRESS   4.  Pt will be able to go 2-3 hours in between voids without urgency or incontinence in order to improve QOL and perform all functional activities with less difficulty.    Baseline:  Goal status: IN PROGRESS     PLAN: PT FREQUENCY: 1x/week   PT DURATION: 12 weeks   PLANNED INTERVENTIONS: Therapeutic exercises, Therapeutic activity, Neuromuscular re-education, Balance training, Gait training, Patient/Family education, Joint mobilization, Dry Needling, Biofeedback, and Manual therapy   PLAN FOR NEXT SESSION: Revisit bladder retraining; pelvic floor strengthening in standing -  lateral step up, single leg hip hinge, heel raises. Mobility/stretches.  D/C PFPT.     Heather Roberts, PT, DPT02/07/243:30 PM

## 2022-12-14 NOTE — Addendum Note (Signed)
Addended by: Candyce Churn B on: 12/14/2022 05:04 PM   Modules accepted: Orders

## 2022-12-16 ENCOUNTER — Ambulatory Visit: Payer: Medicaid Other | Admitting: Rehabilitative and Restorative Service Providers"

## 2022-12-16 ENCOUNTER — Encounter: Payer: Self-pay | Admitting: Rehabilitative and Restorative Service Providers"

## 2022-12-16 DIAGNOSIS — R293 Abnormal posture: Secondary | ICD-10-CM | POA: Diagnosis not present

## 2022-12-16 DIAGNOSIS — M5459 Other low back pain: Secondary | ICD-10-CM

## 2022-12-16 DIAGNOSIS — M170 Bilateral primary osteoarthritis of knee: Secondary | ICD-10-CM

## 2022-12-16 DIAGNOSIS — M62838 Other muscle spasm: Secondary | ICD-10-CM

## 2022-12-16 DIAGNOSIS — R262 Difficulty in walking, not elsewhere classified: Secondary | ICD-10-CM

## 2022-12-16 DIAGNOSIS — M79604 Pain in right leg: Secondary | ICD-10-CM

## 2022-12-16 DIAGNOSIS — M6281 Muscle weakness (generalized): Secondary | ICD-10-CM

## 2022-12-16 NOTE — Therapy (Signed)
OUTPATIENT PHYSICAL THERAPY TREATMENT NOTE   Patient Name: Linda Cunningham MRN: [id number] DOB:09-11-1966, 57 y.o., female Today's Date: 12/16/2022  PCP: [name], MD REFERRING PROVIDER: [name], MD  END OF SESSION:   PT End of Session - 12/16/22 1019     Visit Number 8    Date for PT Re-Evaluation 12/20/22    Authorization Type [employer] Medicaid    PT Start Time [id number]    PT Stop Time 1055    PT Time Calculation (min) 40 min    Activity Tolerance Patient tolerated treatment well    Behavior During Therapy [facility] for tasks assessed/performed                 Past Medical History:  Diagnosis Date   Abnormal nuclear stress test 09/25/2015   Chest pain 01/05/2015   Epigastric pain    Essential hypertension, benign 12/11/2013   Hypertension    Migraine    OSA on CPAP    Followed by Dr. Sima Matas with [facility]   Pain of upper extremity 01/07/2020   Pseudotumor cerebri    SOB (shortness of breath) 09/16/2016   Past Surgical History:  Procedure Laterality Date   CARDIAC CATHETERIZATION N/A 09/25/2015   Procedure: Left Heart Cath and Coronary Angiography;  Surgeon: Peter M Martinique, MD;  Location: [place] [facility];  Service: Cardiovascular;  Laterality: N/A;   CESAREAN SECTION     Patient Active Problem List   Diagnosis Date Noted   Pain of upper extremity 01/07/2020   SOB (shortness of breath) 09/16/2016   OSA on CPAP    Abnormal nuclear stress test 09/25/2015   Chest pain 01/05/2015   Essential hypertension, benign 12/11/2013    REFERRING DIAG: N39.41 (ICD-10-CM) - Urge incontinence  THERAPY DIAG:  Abnormal posture  Other low back pain  Muscle weakness (generalized)  Pain in right leg  Bilateral primary osteoarthritis of knee  Other muscle spasm  Difficulty in walking, not elsewhere classified  Rationale for Evaluation and Treatment Rehabilitation  PERTINENT HISTORY: One c-section, bil knee pain  PRECAUTIONS: NA  SUBJECTIVE:                                                                                                                                                                                       SUBJECTIVE STATEMENT:  Patient reports that she is having some knee pain today.  States that she is still voiding frequently at night and has not yet purchased a Physiological scientist.    PAIN:  Are you having pain? Low back pain Intensity: 8/10 Description: throbbing Aggravating factors: housework, prolonged standing, walking Relieving factors: limiting bending/pulling  PRECAUTIONS: None   WEIGHT BEARING RESTRICTIONS No   FALLS:  Has patient fallen in last 6 months? No   LIVING ENVIRONMENT: Lives with: lives with their family Lives in: House/apartment   OCCUPATION: not currently employed   PLOF: Independent   PATIENT GOALS decrease urinary leaking   PERTINENT HISTORY:  One c-section, bil knee pain Sexual abuse: No   BOWEL MOVEMENT Pain with bowel movement: No Type of bowel movement:Frequency every other day or two days missed and Strain Yes  Fully empty rectum: No Leakage: No Pads: No Fiber supplement: No   URINATION Pain with urination: No Fully empty bladder: No -  Stream:  WNL Urgency: Yes: when she tries to hold  Frequency: typically every hour to every hour and a half; she is not leaking at night anymore, but she is getting up at night several times Leakage: Urge to void, Walking to the bathroom, and then couple times during the night - she does feel like she is doing better at catching it on the way to the bathroom Pads: No - not helpful at night due to amount; none during the day   INTERCOURSE Pain with intercourse:  not painful, but dry Ability to have vaginal penetration:  Yes: - Climax: yes - does not do very often *feels like she has issue with itchiness - MD has ruled out yeast and other infections - she is being told that there is nothing wrong      PREGNANCY Vaginal deliveries  0 Tearing No C-section deliveries 1 Currently pregnant No   PROLAPSE None       OBJECTIVE:    PATIENT SURVEYS:    PFIQ-7 - 98   COGNITION:            Overall cognitive status: Within functional limits for tasks assessed                          SENSATION:            Light touch: Appears intact            Proprioception: Appears intact   GAIT:   Comments: antalgic gait pattern, SPC Rt LE                POSTURE: rounded shoulders and forward head      PALPATION:   General  No abdominal tenderness                 External Perineal Exam WNL                             Internal Pelvic Floor No tenderness in pelvic floor   Patient confirms identification and approves PT to assess internal pelvic floor and treatment Yes   PELVIC MMT: Pelvic floor strength 3/5 Endurance 4 seconds Repeat contractions 13x - poor motor control         TONE: WNL   PROLAPSE: Grade 1 anterior vaignal wall laxity   TODAY'S TREATMENT   DATE: 12/16/2022 Nustep level 1 x6 minutes with PT present to discuss status Seated hip IR with yellow loop 2x10 Seated with 2#:  marching, LAQ with ball squeeze.  2x10 bilat each Seated hip adduction ball squeeze 2x10 Seated dead-lift with 10# kettlebell 2x10 Sit to stand holding 10# kettlebell 2x10 Standing mini lunges onto bosu x10 bilat with SPC for balance Seated hamstring stretch 2x20 sec bilat Seated piriformis stretch 1x20  sec bilat   12/14/22 Neuromuscular re-education: Balance board activities Therapeutic activities: Wearing pads/briefs at night to help increase your confidence with sleeping through the night in order to get better rest Pt is working on going to the bathroom every 2.5-3 hours Stopping fluid intake 3 hours before bed and not drinking when she wakes over night - increasing water intake throughout the day   DATE: 11/29/22 Neuromuscular re-education: Supine D2 with feet elevated on swiss ball, 2lbs ball, 2 x 10 bil Supine  lower trunk rotation with legs elevated on red ball 2 x 10 Supine ball press for core activation 2 x 10 Supine heel digs/slides on ball 10x bil Seated hip IR 2 x 10 yellow loop band Therapeutic activities: March 2 x 10, 2 lb weight hold Sit>stand 2 x 10, 10lb KB Seated deadlift 3 x 10, 10lb kettle bell Unilateral row 2 x 10 bil, green band Sidestepping 3 laps, holding 10 lb kettle bell   TREATMENT 11/22/22 Neuromuscular re-education: Supine D2 with feet elevated on swiss ball, 2lbs ball, 2 x 10 bil Exercises: Bridge with red band around thighs 2 x 10 Supine piriformis stretch 60 sec bil Supine hamstring stretch 60 sec bil Seated hip adduction ball squeeze 10 x 5 seconds Seated abduction blue band 10 x 5 seconds Therapeutic activities: 3 way kick 10x bil  Sit>stand 10x 5lb KB Seated deadlift 10x       PATIENT EDUCATION:  Education details: see above self-care Person educated: Patient Education method: Explanation, Demonstration, Tactile cues, Verbal cues, and Handouts Education comprehension: verbalized understanding     HOME EXERCISE PROGRAM: Access Code: [email or web address] URL: [email or web address] Date: 12/16/2022 Prepared by: Juel Burrow  Exercises - Supine Pelvic Floor Contraction  - 3 x daily - 7 x weekly - 1 sets - 5 reps - 10 hold - Quick Flick Pelvic Floor Contractions in Hooklying  - 3 x daily - 7 x weekly - 1 sets - 10 reps - Seated March  - 1 x daily - 7 x weekly - 3 sets - 10 reps - Seated Hip Adduction Isometrics with Ball  - 1 x daily - 7 x weekly - 3 sets - 10 reps - Seated Hip Abduction with Resistance  - 1 x daily - 7 x weekly - 3 sets - 10 reps - Seated Single Shoulder PNF D2 with Resistance  - 1 x daily - 7 x weekly - 1 sets - 10 reps - Seated pull down (pull down and out with your exhale)  - 1 x daily - 7 x weekly - 2 sets - 10 reps - Standing Hamstring Stretch on Chair  - 1 x daily - 7 x weekly - 1 sets - 3 reps - 20 sec hold - Quadricep  Stretch with Chair and Counter Support  - 1 x daily - 7 x weekly - 1 sets - 3 reps - 20 sec hold   ASSESSMENT:   CLINICAL IMPRESSION: Angeleen presents to skilled PT stating that she can tell that her knees are hurting her more today, she also reports feeling like her leg muscles are weaker than they were before she started having the pain and incontinence.  Patient educated that the strengthening and core exercises that she has been and is continuing to progress with will address the atrophy that she is noting in her muscles to help her regain her strength.  She verbalizes understanding.  Patient requires cuing throughout for technique and holding muscle contraction to increase muscle fiber recruitment.  Patient continues to require skilled PT to progress towards goal related activities.     OBJECTIVE IMPAIRMENTS decreased activity tolerance, decreased coordination, decreased endurance, decreased strength, increased fascial restrictions, increased muscle spasms, postural dysfunction, and pain.    ACTIVITY LIMITATIONS continence, housework, cooking   PARTICIPATION LIMITATIONS: interpersonal relationship and community activity   PERSONAL FACTORS 1 comorbidity: c-section  are also affecting patient's functional outcome.    REHAB POTENTIAL: Good   CLINICAL DECISION MAKING: Stable/uncomplicated   EVALUATION COMPLEXITY: Low     GOALS: Goals reviewed with patient? Yes   SHORT TERM GOALS: Target date: 10/25/22 - updated 11/15/22   Pt will be independent with HEP.    Baseline: Goal status: MET 11/15/22   2. Pt will be able to go 2-3 hours in between voids without urgency or incontinence in order to improve QOL and perform all functional activities with less difficulty.    Baseline:  Goal status: IN PROGRESS   3.  Pt will be independent with use of squatty potty, relaxed toileting mechanics, and improved bowel movement techniques in order to increase ease of bowel movements and complete  evacuation and improve urinary urgency.    Baseline:  Goal status: IN PROGRESS     LONG TERM GOALS: Target date: 12/20/22 - updated 11/15/22   Pt will be independent with advanced HEP.    Baseline:  Goal status: IN PROGRESS   2.  Pt will demonstrate normal pelvic floor muscle tone and A/ROM, able to achieve 4/5 strength with contractions and 10 sec endurance, in order to provide appropriate lumbopelvic support in functional activities.    Baseline:  Goal status: IN PROGRESS   3.  Pt will report no episodes of urinary incontinence in order to improve sleeping, confidence in community activities and personal hygiene.    Baseline:  Goal status: IN PROGRESS   4.  Pt will be able to go 2-3 hours in between voids without urgency or incontinence in order to improve QOL and perform all functional activities with less difficulty.    Baseline:  Goal status: IN PROGRESS     PLAN: PT FREQUENCY: 1x/week   PT DURATION: 12 weeks   PLANNED INTERVENTIONS: Therapeutic exercises, Therapeutic activity, Neuromuscular re-education, Balance training, Gait training, Patient/Family education, Joint mobilization, Dry Needling, Biofeedback, and Manual therapy   PLAN FOR NEXT SESSION: Revisit bladder retraining; pelvic floor strengthening in standing -  lateral step up, single leg hip hinge, heel raises. Mobility/stretches.  D/C PFPT.     Juel Burrow, PT, DPT 12/16/2409:16 AM  [facility] [address] [place], [place] [address] Phone # [phone] Fax [phone]

## 2022-12-19 ENCOUNTER — Ambulatory Visit: Payer: Medicaid Other

## 2022-12-19 DIAGNOSIS — M5459 Other low back pain: Secondary | ICD-10-CM

## 2022-12-19 DIAGNOSIS — M6281 Muscle weakness (generalized): Secondary | ICD-10-CM

## 2022-12-19 DIAGNOSIS — R293 Abnormal posture: Secondary | ICD-10-CM | POA: Diagnosis not present

## 2022-12-19 DIAGNOSIS — M79604 Pain in right leg: Secondary | ICD-10-CM

## 2022-12-19 NOTE — Therapy (Signed)
OUTPATIENT PHYSICAL THERAPY TREATMENT NOTE   Patient Name: Linda Cunningham MRN: [id number] DOB:January 10, 1966, 57 y.o., female Today's Date: 12/19/2022  PCP: [name], MD REFERRING PROVIDER: [name], MD  END OF SESSION:   PT End of Session - 12/19/22 0927     Visit Number 9   3 low back, 6 PF   Date for PT Re-Evaluation 01/25/23    Authorization Type [employer] Medicaid    Authorization Time Period 27 visit limit    Authorization - Visit Number 9    Authorization - Number of Visits 27    PT Start Time [phone]    PT Stop Time 0930    PT Time Calculation (min) 38 min    Activity Tolerance Patient tolerated treatment well    Behavior During Therapy [facility] for tasks assessed/performed                  Past Medical History:  Diagnosis Date   Abnormal nuclear stress test 09/25/2015   Chest pain 01/05/2015   Epigastric pain    Essential hypertension, benign 12/11/2013   Hypertension    Migraine    OSA on CPAP    Followed by Dr. Sima Matas with [facility]   Pain of upper extremity 01/07/2020   Pseudotumor cerebri    SOB (shortness of breath) 09/16/2016   Past Surgical History:  Procedure Laterality Date   CARDIAC CATHETERIZATION N/A 09/25/2015   Procedure: Left Heart Cath and Coronary Angiography;  Surgeon: Peter M Martinique, MD;  Location: [place] [facility];  Service: Cardiovascular;  Laterality: N/A;   CESAREAN SECTION     Patient Active Problem List   Diagnosis Date Noted   Pain of upper extremity 01/07/2020   SOB (shortness of breath) 09/16/2016   OSA on CPAP    Abnormal nuclear stress test 09/25/2015   Chest pain 01/05/2015   Essential hypertension, benign 12/11/2013    REFERRING DIAG: N39.41 (ICD-10-CM) - Urge incontinence  THERAPY DIAG:  Abnormal posture  Other low back pain  Muscle weakness (generalized)  Pain in right leg  Rationale for Evaluation and Treatment Rehabilitation  PERTINENT HISTORY: One c-section, bil knee pain  PRECAUTIONS:  NA  SUBJECTIVE:                                                                                                                                                                                      SUBJECTIVE STATEMENT:  Patient reports that she is having some knee pain today.  States that she is still voiding frequently at night and has not yet purchased a Physiological scientist.    PAIN:  Are you having pain? Low  back pain Intensity: 8/10 Description: throbbing Aggravating factors: housework, prolonged standing, walking Relieving factors: limiting bending/pulling   PRECAUTIONS: None   WEIGHT BEARING RESTRICTIONS No   FALLS:  Has patient fallen in last 6 months? No   LIVING ENVIRONMENT: Lives with: lives with their family Lives in: House/apartment   OCCUPATION: not currently employed   PLOF: Independent   PATIENT GOALS decrease urinary leaking   PERTINENT HISTORY:  One c-section, bil knee pain Sexual abuse: No   BOWEL MOVEMENT Pain with bowel movement: No Type of bowel movement:Frequency every other day or two days missed and Strain Yes  Fully empty rectum: No Leakage: No Pads: No Fiber supplement: No   URINATION Pain with urination: No Fully empty bladder: No -  Stream:  WNL Urgency: Yes: when she tries to hold  Frequency: typically every hour to every hour and a half; she is not leaking at night anymore, but she is getting up at night several times Leakage: Urge to void, Walking to the bathroom, and then couple times during the night - she does feel like she is doing better at catching it on the way to the bathroom Pads: No - not helpful at night due to amount; none during the day   INTERCOURSE Pain with intercourse:  not painful, but dry Ability to have vaginal penetration:  Yes: - Climax: yes - does not do very often *feels like she has issue with itchiness - MD has ruled out yeast and other infections - she is being told that there is nothing wrong       PREGNANCY Vaginal deliveries 0 Tearing No C-section deliveries 1 Currently pregnant No   PROLAPSE None       OBJECTIVE:    PATIENT SURVEYS:    PFIQ-7 - 80   COGNITION:            Overall cognitive status: Within functional limits for tasks assessed                          SENSATION:            Light touch: Appears intact            Proprioception: Appears intact   GAIT:   Comments: antalgic gait pattern, SPC Rt LE                POSTURE: rounded shoulders and forward head      PALPATION:   General  No abdominal tenderness                 External Perineal Exam WNL                             Internal Pelvic Floor No tenderness in pelvic floor   Patient confirms identification and approves PT to assess internal pelvic floor and treatment Yes   PELVIC MMT: Pelvic floor strength 3/5 Endurance 4 seconds Repeat contractions 13x - poor motor control         TONE: WNL   PROLAPSE: Grade 1 anterior vaignal wall laxity   TODAY'S TREATMENT  DATE: 12/19/2022 Nustep level 3 x6 minutes with PT present to discuss status Supine TA activation with ball squeeze 5" x20 Seated with 2.5#:  marching, LAQ with ball squeeze.  2x10 bilat each Supine hip abduction with yellow loop 2x10 Low trunk rotation 3x20 seconds  Knee to chest 3x20 seconds  Seated hamstring stretch  2x20 sec bilat Seated piriformis stretch 2x20 sec bilat  DATE: 12/16/2022 Nustep level 1 x6 minutes with PT present to discuss status Seated hip IR with yellow loop 2x10 Seated with 2#:  marching, LAQ with ball squeeze.  2x10 bilat each Seated hip adduction ball squeeze 2x10 Seated dead-lift with 10# kettlebell 2x10 Sit to stand holding 10# kettlebell 2x10 Standing mini lunges onto bosu x10 bilat with SPC for balance Seated hamstring stretch 2x20 sec bilat Seated piriformis stretch 1x20 sec bilat   12/14/22 Neuromuscular re-education: Balance board activities Therapeutic activities: Wearing  pads/briefs at night to help increase your confidence with sleeping through the night in order to get better rest Pt is working on going to the bathroom every 2.5-3 hours Stopping fluid intake 3 hours before bed and not drinking when she wakes over night - increasing water intake throughout the day   DATE: 11/29/22 Neuromuscular re-education: Supine D2 with feet elevated on swiss ball, 2lbs ball, 2 x 10 bil Supine lower trunk rotation with legs elevated on red ball 2 x 10 Supine ball press for core activation 2 x 10 Supine heel digs/slides on ball 10x bil Seated hip IR 2 x 10 yellow loop band Therapeutic activities: March 2 x 10, 2 lb weight hold Sit>stand 2 x 10, 10lb KB Seated deadlift 3 x 10, 10lb kettle bell Unilateral row 2 x 10 bil, green band Sidestepping 3 laps, holding 10 lb kettle bell   PATIENT EDUCATION:  Education details: see above self-care Person educated: Patient Education method: Explanation, Demonstration, Tactile cues, Verbal cues, and Handouts Education comprehension: verbalized understanding     HOME EXERCISE PROGRAM: Access Code: [email or web address] URL: [email or web address] Date: 12/16/2022 Prepared by: Juel Burrow  Exercises - Supine Pelvic Floor Contraction  - 3 x daily - 7 x weekly - 1 sets - 5 reps - 10 hold - Quick Flick Pelvic Floor Contractions in Hook[place]  - 3 x daily - 7 x weekly - 1 sets - 10 reps - Seated March  - 1 x daily - 7 x weekly - 3 sets - 10 reps - Seated Hip Adduction Isometrics with Ball  - 1 x daily - 7 x weekly - 3 sets - 10 reps - Seated Hip Abduction with Resistance  - 1 x daily - 7 x weekly - 3 sets - 10 reps - Seated Single Shoulder PNF D2 with Resistance  - 1 x daily - 7 x weekly - 1 sets - 10 reps - Seated pull down (pull down and out with your exhale)  - 1 x daily - 7 x weekly - 2 sets - 10 reps - Standing Hamstring Stretch on Chair  - 1 x daily - 7 x weekly - 1 sets - 3 reps - 20 sec hold - Quadricep Stretch with  Chair and Counter Support  - 1 x daily - 7 x weekly - 1 sets - 3 reps - 20 sec hold   ASSESSMENT:   CLINICAL IMPRESSION: Pt with increased bil knee pain today.  She reports fatigue after last session but not increased LBP.  PT eliminated sit to stand today as pt felt that increased her knee pain.  She did well with all other exercise in the clinic today without increased pain.  Patient requires cuing throughout for technique and holding muscle contraction to increase muscle fiber recruitment.  She require less cueing today.   Patient continues to require skilled PT to progress towards goal related activities.     OBJECTIVE IMPAIRMENTS  decreased activity tolerance, decreased coordination, decreased endurance, decreased strength, increased fascial restrictions, increased muscle spasms, postural dysfunction, and pain.    ACTIVITY LIMITATIONS continence, housework, cooking   PARTICIPATION LIMITATIONS: interpersonal relationship and community activity   PERSONAL FACTORS 1 comorbidity: c-section  are also affecting patient's functional outcome.    REHAB POTENTIAL: Good   CLINICAL DECISION MAKING: Stable/uncomplicated   EVALUATION COMPLEXITY: Low     GOALS: Goals reviewed with patient? Yes   SHORT TERM GOALS: Target date: 10/25/22 - updated 11/15/22   Pt will be independent with HEP.    Baseline: Goal status: MET 11/15/22   2. Pt will be able to go 2-3 hours in between voids without urgency or incontinence in order to improve QOL and perform all functional activities with less difficulty.    Baseline:  Goal status: IN PROGRESS   3.  Pt will be independent with use of squatty potty, relaxed toileting mechanics, and improved bowel movement techniques in order to increase ease of bowel movements and complete evacuation and improve urinary urgency.    Baseline:  Goal status: IN PROGRESS     LONG TERM GOALS: Target date: 12/20/22 - updated 11/15/22   Pt will be independent with advanced  HEP.    Baseline:  Goal status: IN PROGRESS   2.  Pt will demonstrate normal pelvic floor muscle tone and A/ROM, able to achieve 4/5 strength with contractions and 10 sec endurance, in order to provide appropriate lumbopelvic support in functional activities.    Baseline:  Goal status: IN PROGRESS   3.  Pt will report no episodes of urinary incontinence in order to improve sleeping, confidence in community activities and personal hygiene.    Baseline:  Goal status: IN PROGRESS   4.  Pt will be able to go 2-3 hours in between voids without urgency or incontinence in order to improve QOL and perform all functional activities with less difficulty.    Baseline:  Goal status: IN PROGRESS 5.  Pain in lumbar spine report to be no greater than 2/10  Goal Status: in progress  6. Patient to report 85% improvement in overall symptoms  Goal status: in progress      PLAN: PT FREQUENCY: 1x/week   PT DURATION: 12 weeks   PLANNED INTERVENTIONS: Therapeutic exercises, Therapeutic activity, Neuromuscular re-education, Balance training, Gait training, Patient/Family education, Joint mobilization, Dry Needling, Biofeedback, and Manual therapy   PLAN FOR NEXT SESSION:  continue flexibility, core strength   Sigurd Sos, PT 12/19/22 9:29 AM   [facility] [address], [place], [place] [address] Phone # [phone] Fax [phone]

## 2022-12-20 ENCOUNTER — Ambulatory Visit: Payer: Medicaid Other

## 2022-12-20 DIAGNOSIS — R293 Abnormal posture: Secondary | ICD-10-CM

## 2022-12-20 DIAGNOSIS — R279 Unspecified lack of coordination: Secondary | ICD-10-CM

## 2022-12-20 DIAGNOSIS — M6281 Muscle weakness (generalized): Secondary | ICD-10-CM

## 2022-12-20 NOTE — Therapy (Signed)
OUTPATIENT PHYSICAL THERAPY TREATMENT NOTE   Patient Name: Linda Cunningham MRN: [id number] DOB:02-26-1966, 57 y.o., female Today's Date: 12/20/2022  PCP: [name], MD REFERRING PROVIDER: [name], MD  END OF SESSION:   PT End of Session - 12/20/22 1536     Visit Number 10   7 PF   Date for PT Re-Evaluation 01/25/23    Authorization Type [employer] Medicaid    Authorization Time Period 27 visit limit    Authorization - Visit Number 10    Authorization - Number of Visits 27    PT Start Time [id number]    PT Stop Time 1610    PT Time Calculation (min) 39 min    Activity Tolerance Patient tolerated treatment well    Behavior During Therapy [facility] for tasks assessed/performed                  Past Medical History:  Diagnosis Date   Abnormal nuclear stress test 09/25/2015   Chest pain 01/05/2015   Epigastric pain    Essential hypertension, benign 12/11/2013   Hypertension    Migraine    OSA on CPAP    Followed by Dr. Sima Matas with [place]   Pain of upper extremity 01/07/2020   Pseudotumor cerebri    SOB (shortness of breath) 09/16/2016   Past Surgical History:  Procedure Laterality Date   CARDIAC CATHETERIZATION N/A 09/25/2015   Procedure: Left Heart Cath and Coronary Angiography;  Surgeon: Peter M Martinique, MD;  Location: [place] [facility];  Service: Cardiovascular;  Laterality: N/A;   CESAREAN SECTION     Patient Active Problem List   Diagnosis Date Noted   Pain of upper extremity 01/07/2020   SOB (shortness of breath) 09/16/2016   OSA on CPAP    Abnormal nuclear stress test 09/25/2015   Chest pain 01/05/2015   Essential hypertension, benign 12/11/2013    REFERRING DIAG: N39.41 (ICD-10-CM) - Urge incontinence  THERAPY DIAG:  Abnormal posture  Muscle weakness (generalized)  Unspecified lack of coordination  Rationale for Evaluation and Treatment Rehabilitation  PERTINENT HISTORY: One c-section, bil knee pain  PRECAUTIONS: NA  SUBJECTIVE:                                                                                                                                                                                       SUBJECTIVE STATEMENT: Pt reports sleeping is the same. She is still very concerned about sleeping through the night due to fear of nocturnal enuresis. Other than this, her urinary symptoms are significantly improved. She states that she feels prepared for D/C. She feels like bowel movements are better unless  she has dairy.    PAIN:  Are you having pain? Yes: NPRS scale: 3/10 Pain location: low back Pain description: tight Aggravating factors: worse in morning, standing Relieving factors: lying down  09/27/22 SUBJECTIVE STATEMENT: Pt states that she is still having difficulty with urinary urgency and incontinence. She is having difficulty with frequency at night too, but she is trying not to get up due to it being cold. Having a very hard time with urge suppression technique and making it to he bathroom - specifically with cooking and housework.       Fluid intake: Yes:      PAIN:  Are you having pain? Low back pain Intensity: 8/10 Description: throbbing Aggravating factors: housework, prolonged standing, walking Relieving factors: limiting bending/pulling   PRECAUTIONS: None   WEIGHT BEARING RESTRICTIONS No   FALLS:  Has patient fallen in last 6 months? No   LIVING ENVIRONMENT: Lives with: lives with their family Lives in: House/apartment   OCCUPATION: not currently employed   PLOF: Independent   PATIENT GOALS decrease urinary leaking   PERTINENT HISTORY:  One c-section, bil knee pain Sexual abuse: No   BOWEL MOVEMENT Pain with bowel movement: No Type of bowel movement:Frequency every other day or two days missed and Strain Yes  Fully empty rectum: No Leakage: No Pads: No Fiber supplement: No   URINATION Pain with urination: No Fully empty bladder: No -  Stream:  WNL Urgency: Yes: when  she tries to hold  Frequency: typically every hour to every hour and a half; she is not leaking at night anymore, but she is getting up at night several times Leakage: Urge to void, Walking to the bathroom, and then couple times during the night - she does feel like she is doing better at catching it on the way to the bathroom Pads: No - not helpful at night due to amount; none during the day   INTERCOURSE Pain with intercourse:  not painful, but dry Ability to have vaginal penetration:  Yes: - Climax: yes - does not do very often *feels like she has issue with itchiness - MD has ruled out yeast and other infections - she is being told that there is nothing wrong      PREGNANCY Vaginal deliveries 0 Tearing No C-section deliveries 1 Currently pregnant No   PROLAPSE None       OBJECTIVE:    PATIENT SURVEYS:    PFIQ-7 - 67   COGNITION:            Overall cognitive status: Within functional limits for tasks assessed                          SENSATION:            Light touch: Appears intact            Proprioception: Appears intact   GAIT:   Comments: antalgic gait pattern, SPC Rt LE                POSTURE: rounded shoulders and forward head      PALPATION:   General  No abdominal tenderness                 External Perineal Exam WNL                             Internal Pelvic Floor No tenderness in pelvic floor  Patient confirms identification and approves PT to assess internal pelvic floor and treatment Yes   PELVIC MMT: Pelvic floor strength 3/5 Endurance 4 seconds Repeat contractions 13x - poor motor control         TONE: WNL   PROLAPSE: Grade 1 anterior vaignal wall laxity   TODAY'S TREATMENT D/C Therapeutic activities: Increasing fiber Urge suppression technique for bowel movements  HEP review   TREATMENT 12/14/22 Neuromuscular re-education: Balance board activities Therapeutic activities: Wearing pads/briefs at night to help increase your  confidence with sleeping through the night in order to get better rest Pt is working on going to the bathroom every 2.5-3 hours Stopping fluid intake 3 hours before bed and not drinking when she wakes over night - increasing water intake throughout the day   TREATMENT 11/29/22 Neuromuscular re-education: Supine D2 with feet elevated on swiss ball, 2lbs ball, 2 x 10 bil Supine lower trunk rotation with legs elevated on red ball 2 x 10 Supine ball press for core activation 2 x 10 Supine heel digs/slides on ball 10x bil Seated hip IR 2 x 10 yellow loop band Therapeutic activities: March 2 x 10, 2 lb weight hold Sit>stand 2 x 10, 10lb KB Seated deadlift 3 x 10, 10lb kettle bell Unilateral row 2 x 10 bil, green band Sidestepping 3 laps, holding 10 lb kettle bell     PATIENT EDUCATION:  Education details: see above self-care Person educated: Patient Education method: Explanation, Demonstration, Tactile cues, Verbal cues, and Handouts Education comprehension: verbalized understanding     HOME EXERCISE PROGRAM: [email or web address]   ASSESSMENT:   CLINICAL IMPRESSION:  Pt has made some progress in pelvic floor PT. She is no longer having any urinary incontinence, but she does continue to have some urgency with urination and bowel movements. She continues to not sleep well at night due to fear of enuresis. We discussed use of pads/briefs to help improve confidence at night so that she starts sleeping through the night. Reminders to use squatty potty and incorporate more fiber into diet. She has not had any episodes of urinary incontinence in weeks. Due to having met most of PT goals, she is prepared to D/C skilled pelvic floor physical therapy at this time.      OBJECTIVE IMPAIRMENTS decreased activity tolerance, decreased coordination, decreased endurance, decreased strength, increased fascial restrictions, increased muscle spasms, postural dysfunction, and pain.    ACTIVITY LIMITATIONS continence,  housework, cooking   PARTICIPATION LIMITATIONS: interpersonal relationship and community activity   PERSONAL FACTORS 1 comorbidity: c-section  are also affecting patient's functional outcome.    REHAB POTENTIAL: Good   CLINICAL DECISION MAKING: Stable/uncomplicated   EVALUATION COMPLEXITY: Low     GOALS: Goals reviewed with patient? Yes   SHORT TERM GOALS: Target date: 10/25/22 - updated 11/15/22 - updated 12/20/22   Pt will be independent with HEP.    Baseline: Goal status: MET 11/15/22   2. Pt will be able to go 2-3 hours in between voids without urgency or incontinence in order to improve QOL and perform all functional activities with less difficulty.    Baseline:  Goal status: Met 12/20/22   3.  Pt will be independent with use of squatty potty, relaxed toileting mechanics, and improved bowel movement techniques in order to increase ease of bowel movements and complete evacuation and improve urinary urgency.    Baseline:  Goal status: MET 12/20/22     LONG TERM GOALS: Target date: 12/20/22 - updated 11/15/22 - updated 12/20/22  Pt will be independent with advanced HEP.    Baseline:  Goal status: IN PROGRESS   2.  Pt will demonstrate normal pelvic floor muscle tone and A/ROM, able to achieve 4/5 strength with contractions and 10 sec endurance, in order to provide appropriate lumbopelvic support in functional activities.    Baseline: Not formally assessed, but due to progress, believe that patient has made important strength and motor control gains of pelvic floor 12/20/22 Goal status: DISCHARGED   3.  Pt will report no episodes of urinary incontinence in order to improve sleeping, confidence in community activities and personal hygiene.    Baseline:  Goal status: MET 12/20/22   4.  Pt will be able to go 2-3 hours in between voids without urgency or incontinence in order to improve QOL and perform all functional activities with less difficulty.    Baseline:  Goal status:  MET 12/20/22     PLAN: PT FREQUENCY: 1x/week   PT DURATION: 12 weeks   PLANNED INTERVENTIONS: Therapeutic exercises, Therapeutic activity, Neuromuscular re-education, Balance training, Gait training, Patient/Family education, Joint mobilization, Dry Needling, Biofeedback, and Manual therapy   PLAN FOR NEXT SESSION: D/C   PHYSICAL THERAPY DISCHARGE SUMMARY  Visits from Start of Care: 7  Current functional level related to goals / functional outcomes: Independent   Remaining deficits: See above   Education / Equipment: HEP   Patient agrees to discharge. Patient goals were met. Patient is being discharged due to meeting the stated rehab goals.   Heather Roberts, PT, DPT02/13/243:44 PM

## 2022-12-29 ENCOUNTER — Ambulatory Visit: Payer: Medicaid Other

## 2023-01-05 ENCOUNTER — Ambulatory Visit: Payer: Medicaid Other

## 2023-01-05 DIAGNOSIS — M5459 Other low back pain: Secondary | ICD-10-CM

## 2023-01-05 DIAGNOSIS — R293 Abnormal posture: Secondary | ICD-10-CM | POA: Diagnosis not present

## 2023-01-05 DIAGNOSIS — R262 Difficulty in walking, not elsewhere classified: Secondary | ICD-10-CM

## 2023-01-05 DIAGNOSIS — M79604 Pain in right leg: Secondary | ICD-10-CM

## 2023-01-05 DIAGNOSIS — R279 Unspecified lack of coordination: Secondary | ICD-10-CM

## 2023-01-05 DIAGNOSIS — M6281 Muscle weakness (generalized): Secondary | ICD-10-CM

## 2023-01-05 DIAGNOSIS — M170 Bilateral primary osteoarthritis of knee: Secondary | ICD-10-CM

## 2023-01-05 NOTE — Therapy (Signed)
OUTPATIENT PHYSICAL THERAPY TREATMENT NOTE   Patient Name: Linda Cunningham MRN: [id number] DOB:[birth year], 57 y.o., female Today's Date: 01/05/2023  PCP: [name], MD REFERRING PROVIDER: [name], MD  END OF SESSION:   PT End of Session - 01/05/23 1029     Visit Number 11    Date for PT Re-Evaluation 01/25/23    Authorization Type [employer]    Authorization Time Period 27 visit limit    Authorization - Visit Number 11    Authorization - Number of Visits 27    Progress Note Due on Visit 20    PT Start Time 1020    PT Stop Time 1103    PT Time Calculation (min) 43 min    Activity Tolerance Patient tolerated treatment well    Behavior During Therapy [facility] for tasks assessed/performed                  Past Medical History:  Diagnosis Date   Abnormal nuclear stress test 09/25/2015   Chest pain 01/05/2015   Epigastric pain    Essential hypertension, benign 12/11/2013   Hypertension    Migraine    OSA on CPAP    Followed by Dr. Sima Matas with [facility]   Pain of upper extremity 01/07/2020   Pseudotumor cerebri    SOB (shortness of breath) 09/16/2016   Past Surgical History:  Procedure Laterality Date   CARDIAC CATHETERIZATION N/A 09/25/2015   Procedure: Left Heart Cath and Coronary Angiography;  Surgeon: Peter M Martinique, MD;  Location: [place] [facility];  Service: Cardiovascular;  Laterality: N/A;   CESAREAN SECTION     Patient Active Problem List   Diagnosis Date Noted   Pain of upper extremity 01/07/2020   SOB (shortness of breath) 09/16/2016   OSA on CPAP    Abnormal nuclear stress test 09/25/2015   Chest pain 01/05/2015   Essential hypertension, benign 12/11/2013    REFERRING DIAG: N39.41 (ICD-10-CM) - Urge incontinence  THERAPY DIAG:  Muscle weakness (generalized)  Other low back pain  Unspecified lack of coordination  Pain in right leg  Difficulty in walking, not elsewhere classified  Bilateral primary osteoarthritis of  knee  Rationale for Evaluation and Treatment Rehabilitation  PERTINENT HISTORY: One c-section, bil knee pain  PRECAUTIONS: NA  SUBJECTIVE:                                                                                                                                                                                      SUBJECTIVE STATEMENT: Pt has been discharged from pelvic floor PT.  She reports she did have an "accident a few days ago" but  is being diligent with her HEP.  She continues to have knee pain and difficulty with mobility.    PAIN:  Are you having pain? Yes: NPRS scale: 3/10 Pain location: low back Pain description: tight Aggravating factors: worse in morning, standing Relieving factors: lying down  09/27/22 SUBJECTIVE STATEMENT: Pt states that she is still having difficulty with urinary urgency and incontinence. She is having difficulty with frequency at night too, but she is trying not to get up due to it being cold. Having a very hard time with urge suppression technique and making it to he bathroom - specifically with cooking and housework.       Fluid intake: Yes:      PAIN:  Are you having pain? Low back pain Intensity: 8/10 Description: throbbing Aggravating factors: housework, prolonged standing, walking Relieving factors: limiting bending/pulling   PRECAUTIONS: None   WEIGHT BEARING RESTRICTIONS No   FALLS:  Has patient fallen in last 6 months? No   LIVING ENVIRONMENT: Lives with: lives with their family Lives in: House/apartment   OCCUPATION: not currently employed   PLOF: Independent   PATIENT GOALS decrease urinary leaking   PERTINENT HISTORY:  One c-section, bil knee pain Sexual abuse: No   BOWEL MOVEMENT Pain with bowel movement: No Type of bowel movement:Frequency every other day or two days missed and Strain Yes  Fully empty rectum: No Leakage: No Pads: No Fiber supplement: No   URINATION Pain with urination: No Fully empty  bladder: No -  Stream:  WNL Urgency: Yes: when she tries to hold  Frequency: typically every hour to every hour and a half; she is not leaking at night anymore, but she is getting up at night several times Leakage: Urge to void, Walking to the bathroom, and then couple times during the night - she does feel like she is doing better at catching it on the way to the bathroom Pads: No - not helpful at night due to amount; none during the day   INTERCOURSE Pain with intercourse:  not painful, but dry Ability to have vaginal penetration:  Yes: - Climax: yes - does not do very often *feels like she has issue with itchiness - MD has ruled out yeast and other infections - she is being told that there is nothing wrong      PREGNANCY Vaginal deliveries 0 Tearing No C-section deliveries 1 Currently pregnant No   PROLAPSE None       OBJECTIVE:    PATIENT SURVEYS:    PFIQ-7 - 67   COGNITION:            Overall cognitive status: Within functional limits for tasks assessed                          SENSATION:            Light touch: Appears intact            Proprioception: Appears intact   GAIT:   Comments: antalgic gait pattern, SPC Rt LE                POSTURE: rounded shoulders and forward head      PALPATION:   General  No abdominal tenderness                 External Perineal Exam WNL  Internal Pelvic Floor No tenderness in pelvic floor   Patient confirms identification and approves PT to assess internal pelvic floor and treatment Yes   PELVIC MMT: Pelvic floor strength 3/5 Endurance 4 seconds Repeat contractions 13x - poor motor control         TONE: WNL   PROLAPSE: Grade 1 anterior vaignal wall laxity   TODAY'S TREATMENT 01/05/23 Supine with feet on red physio ball D2 right x 20 then left x 20 Supine with feet on red physio ball heel presses with pelvic floor contraction x 20 Supine with feet on red physio ball overhead pull  downs x 20 with red tband with handles Supine with feet and knees at 90/90 degrees overhead pull downs with red tband with handles x 20 Seated LAQ  x 20 with 3 lbs Seated march x 20 with 3 lbs Seated hip ER with 3 lbs 2 x 10 each LE Sit to stand 2 x 5 with vc's for correct alignment Educated patient on concentric vs eccentric control and importance of hip strength and alignment    D/C Therapeutic activities: Increasing fiber Urge suppression technique for bowel movements  HEP review   TREATMENT 12/14/22 Neuromuscular re-education: Balance board activities Therapeutic activities: Wearing pads/briefs at night to help increase your confidence with sleeping through the night in order to get better rest Pt is working on going to the bathroom every 2.5-3 hours Stopping fluid intake 3 hours before bed and not drinking when she wakes over night - increasing water intake throughout the day   TREATMENT 11/29/22 Neuromuscular re-education: Supine D2 with feet elevated on swiss ball, 2lbs ball, 2 x 10 bil Supine lower trunk rotation with legs elevated on red ball 2 x 10 Supine ball press for core activation 2 x 10 Supine heel digs/slides on ball 10x bil Seated hip IR 2 x 10 yellow loop band Therapeutic activities: March 2 x 10, 2 lb weight hold Sit>stand 2 x 10, 10lb KB Seated deadlift 3 x 10, 10lb kettle bell Unilateral row 2 x 10 bil, green band Sidestepping 3 laps, holding 10 lb kettle bell     PATIENT EDUCATION:  Education details: see above self-care Person educated: Patient Education method: Explanation, Demonstration, Tactile cues, Verbal cues, and Handouts Education comprehension: verbalized understanding     HOME EXERCISE PROGRAM: [id number]   ASSESSMENT:   CLINICAL IMPRESSION:  Linda Cunningham was able to complete all tasks today with only minimal knee pain.  She tends to externally rotate the hips during sit to stand and squatting but is able to correct this with verbal cues.   She fatigues easily.  She would benefit from skilled PT for core stabilization and bilateral quad rehab.       OBJECTIVE IMPAIRMENTS decreased activity tolerance, decreased coordination, decreased endurance, decreased strength, increased fascial restrictions, increased muscle spasms, postural dysfunction, and pain.    ACTIVITY LIMITATIONS continence, housework, cooking   PARTICIPATION LIMITATIONS: interpersonal relationship and community activity   PERSONAL FACTORS 1 comorbidity: c-section  are also affecting patient's functional outcome.    REHAB POTENTIAL: Good   CLINICAL DECISION MAKING: Stable/uncomplicated   EVALUATION COMPLEXITY: Low     GOALS: Goals reviewed with patient? Yes   SHORT TERM GOALS: Target date: 10/25/22 - updated 11/15/22 - updated 12/20/22   Pt will be independent with HEP.    Baseline: Goal status: MET 11/15/22   2. Pt will be able to go 2-3 hours in between voids without urgency or incontinence in order to improve QOL  and perform all functional activities with less difficulty.    Baseline:  Goal status: Met 12/20/22   3.  Pt will be independent with use of squatty potty, relaxed toileting mechanics, and improved bowel movement techniques in order to increase ease of bowel movements and complete evacuation and improve urinary urgency.    Baseline:  Goal status: MET 12/20/22     LONG TERM GOALS: Target date: 12/20/22 - updated 11/15/22 - updated 12/20/22   Pt will be independent with advanced HEP.    Baseline:  Goal status: IN PROGRESS   2.  Pt will demonstrate normal pelvic floor muscle tone and A/ROM, able to achieve 4/5 strength with contractions and 10 sec endurance, in order to provide appropriate lumbopelvic support in functional activities.    Baseline: Not formally assessed, but due to progress, believe that patient has made important strength and motor control gains of pelvic floor 12/20/22 Goal status: DISCHARGED   3.  Pt will report no episodes  of urinary incontinence in order to improve sleeping, confidence in community activities and personal hygiene.    Baseline:  Goal status: MET 12/20/22   4.  Pt will be able to go 2-3 hours in between voids without urgency or incontinence in order to improve QOL and perform all functional activities with less difficulty.    Baseline:  Goal status: MET 12/20/22     PLAN: PT FREQUENCY: 1x/week   PT DURATION: 12 weeks   PLANNED INTERVENTIONS: Therapeutic exercises, Therapeutic activity, Neuromuscular re-education, Balance training, Gait training, Patient/Family education, Joint mobilization, Dry Needling, Biofeedback, and Manual therapy   PLAN FOR NEXT SESSION:  Continue with quad rehab with some focus on proximal hip strength and core strength.   Anderson Malta B. Onisha Cedeno, PT 01/05/23 1:01 PM  [facility] [address] [place], [place] [address] Phone # [phone] Fax [phone]

## 2023-01-12 ENCOUNTER — Ambulatory Visit: Payer: Medicaid Other | Attending: Family Medicine

## 2023-01-12 DIAGNOSIS — M6281 Muscle weakness (generalized): Secondary | ICD-10-CM

## 2023-01-12 DIAGNOSIS — M5459 Other low back pain: Secondary | ICD-10-CM | POA: Diagnosis present

## 2023-01-12 DIAGNOSIS — R262 Difficulty in walking, not elsewhere classified: Secondary | ICD-10-CM | POA: Diagnosis present

## 2023-01-12 DIAGNOSIS — M79604 Pain in right leg: Secondary | ICD-10-CM | POA: Insufficient documentation

## 2023-01-12 NOTE — Therapy (Signed)
OUTPATIENT PHYSICAL THERAPY TREATMENT NOTE   Patient Name: Linda Cunningham MRN: [id number] DOB:1966/09/13, 57 y.o., female Today's Date: 01/12/2023  PCP: [name], MD REFERRING PROVIDER: [name], MD  END OF SESSION:   PT End of Session - 01/12/23 1228     Visit Number 12    Date for PT Re-Evaluation 01/25/23    Authorization Type [employer] Medicaid    Authorization Time Period 27 visit limit    Authorization - Visit Number 12    Authorization - Number of Visits 27    Progress Note Due on Visit 20    PT Start Time 1147    PT Stop Time 1228    PT Time Calculation (min) 41 min    Activity Tolerance Patient tolerated treatment well    Behavior During Therapy [facility] for tasks assessed/performed                   Past Medical History:  Diagnosis Date   Abnormal nuclear stress test 09/25/2015   Chest pain 01/05/2015   Epigastric pain    Essential hypertension, benign 12/11/2013   Hypertension    Migraine    OSA on CPAP    Followed by Dr. Sima Matas with [facility]   Pain of upper extremity 01/07/2020   Pseudotumor cerebri    SOB (shortness of breath) 09/16/2016   Past Surgical History:  Procedure Laterality Date   CARDIAC CATHETERIZATION N/A 09/25/2015   Procedure: Left Heart Cath and Coronary Angiography;  Surgeon: Peter M Martinique, MD;  Location: [place] [facility];  Service: Cardiovascular;  Laterality: N/A;   CESAREAN SECTION     Patient Active Problem List   Diagnosis Date Noted   Pain of upper extremity 01/07/2020   SOB (shortness of breath) 09/16/2016   OSA on CPAP    Abnormal nuclear stress test 09/25/2015   Chest pain 01/05/2015   Essential hypertension, benign 12/11/2013    REFERRING DIAG: N39.41 (ICD-10-CM) - Urge incontinence  THERAPY DIAG:  Muscle weakness (generalized)  Other low back pain  Pain in right leg  Difficulty in walking, not elsewhere classified  Rationale for Evaluation and Treatment Rehabilitation  PERTINENT HISTORY:  One c-section, bil knee pain  PRECAUTIONS: NA  SUBJECTIVE:                                                                                                                                                                                      SUBJECTIVE STATEMENT: My back is feeling better as long as the weather isn't acting up.  My head and neck are hurting today from migraines. 50% improvement in LBP since the start of  care.    PAIN:  Are you having pain? Yes: NPRS scale: 0/10 Pain location: low back Pain description: tight Aggravating factors: worse in morning, standing Relieving factors: lying down  09/27/22 SUBJECTIVE STATEMENT: Pt states that she is still having difficulty with urinary urgency and incontinence. She is having difficulty with frequency at night too, but she is trying not to get up due to it being cold. Having a very hard time with urge suppression technique and making it to he bathroom - specifically with cooking and housework.       Fluid intake: Yes:    PRECAUTIONS: None   WEIGHT BEARING RESTRICTIONS No   FALLS:  Has patient fallen in last 6 months? No   LIVING ENVIRONMENT: Lives with: lives with their family Lives in: House/apartment   OCCUPATION: not currently employed   PLOF: Independent   PATIENT GOALS decrease urinary leaking   PERTINENT HISTORY:  One c-section, bil knee pain Sexual abuse: No   BOWEL MOVEMENT Pain with bowel movement: No Type of bowel movement:Frequency every other day or two days missed and Strain Yes  Fully empty rectum: No Leakage: No Pads: No Fiber supplement: No   URINATION Pain with urination: No Fully empty bladder: No -  Stream:  WNL Urgency: Yes: when she tries to hold  Frequency: typically every hour to every hour and a half; she is not leaking at night anymore, but she is getting up at night several times Leakage: Urge to void, Walking to the bathroom, and then couple times during the night - she does feel  like she is doing better at catching it on the way to the bathroom Pads: No - not helpful at night due to amount; none during the day   INTERCOURSE Pain with intercourse:  not painful, but dry Ability to have vaginal penetration:  Yes: - Climax: yes - does not do very often *feels like she has issue with itchiness - MD has ruled out yeast and other infections - she is being told that there is nothing wrong      PREGNANCY Vaginal deliveries 0 Tearing No C-section deliveries 1 Currently pregnant No   PROLAPSE None       OBJECTIVE:    PATIENT SURVEYS:   PFIQ-7 - 67   COGNITION:            Overall cognitive status: Within functional limits for tasks assessed                          SENSATION:            Light touch: Appears intact            Proprioception: Appears intact   GAIT:   Comments: antalgic gait pattern, SPC Rt LE                POSTURE: rounded shoulders and forward head    PALPATION:   General  No abdominal tenderness                 External Perineal Exam WNL                             Internal Pelvic Floor No tenderness in pelvic floor   Patient confirms identification and approves PT to assess internal pelvic floor and treatment Yes   PELVIC MMT: Pelvic floor strength 3/5 Endurance 4 seconds Repeat contractions 13x - poor  motor control  PROLAPSE: Grade 1 anterior vaignal wall laxity   TODAY'S TREATMENT 01/12/23 Supine with feet on red physio ball horizontal abduction and D2 with yellow theraband 2x10 bil each Supine with feet on red physio ball heel presses with pelvic floor contraction x 20 Supine with feet on red physio ball overhead pull downs x 20 with red tband with handles Supine TA activation with ball squeeze: 5" hold 2x10 Seated LAQ  x 20 with 3 lbs Seated march x 20 with 3 lbs Seated hip ER with 3 lbs 2 x 10 each LE Sit to stand 2 x 5 with vc's for correct alignment Nustep: Level 4x 6 minutes-PT present to discuss progress    01/05/23 Supine with feet on red physio ball D2 right x 20 then left x 20 Supine with feet on red physio ball heel presses with pelvic floor contraction x 20 Supine with feet on red physio ball overhead pull downs x 20 with red tband with handles Supine with feet and knees at 90/90 degrees overhead pull downs with red tband with handles x 20 Seated LAQ  x 20 with 3 lbs Seated march x 20 with 3 lbs Seated hip ER with 3 lbs 2 x 10 each LE Sit to stand 2 x 5 with vc's for correct alignment Educated patient on concentric vs eccentric control and importance of hip strength and alignment     PATIENT EDUCATION:  Education details: see above self-care Person educated: Patient Education method: Explanation, Demonstration, Tactile cues, Verbal cues, and Handouts Education comprehension: verbalized understanding     HOME EXERCISE PROGRAM: [id number]   ASSESSMENT:   CLINICAL IMPRESSION: Pt arrived with a headache today due to migraine.  Pt did well with all exercises in the clinic today and PT provided verbal and tactile cues for cocontraction of core and hip muscles.  Pt reports 50% reduction in LBP and Rt LE pain since the start of care.  Patient will benefit from skilled PT to address the below impairments and improve overall function.      OBJECTIVE IMPAIRMENTS decreased activity tolerance, decreased coordination, decreased endurance, decreased strength, increased fascial restrictions, increased muscle spasms, postural dysfunction, and pain.    ACTIVITY LIMITATIONS continence, housework, cooking   PARTICIPATION LIMITATIONS: interpersonal relationship and community activity   PERSONAL FACTORS 1 comorbidity: c-section  are also affecting patient's functional outcome.    REHAB POTENTIAL: Good   CLINICAL DECISION MAKING: Stable/uncomplicated   EVALUATION COMPLEXITY: Low     GOALS: Goals reviewed with patient? Yes   SHORT TERM GOALS: Target date: 10/25/22 - updated 11/15/22 - updated  12/20/22   Pt will be independent with HEP.    Baseline: Goal status: MET 11/15/22   2. Pt will be able to go 2-3 hours in between voids without urgency or incontinence in order to improve QOL and perform all functional activities with less difficulty.    Baseline:  Goal status: Met 12/20/22   3.  Pt will be independent with use of squatty potty, relaxed toileting mechanics, and improved bowel movement techniques in order to increase ease of bowel movements and complete evacuation and improve urinary urgency.    Baseline:  Goal status: MET 12/20/22     LONG TERM GOALS: Target date: 12/20/22 - updated 11/15/22 - updated 12/20/22   Pt will be independent with advanced HEP.    Baseline:  Goal status: IN PROGRESS   2.  Pt will demonstrate normal pelvic floor muscle tone and A/ROM, able to achieve  4/5 strength with contractions and 10 sec endurance, in order to provide appropriate lumbopelvic support in functional activities.    Baseline: Not formally assessed, but due to progress, believe that patient has made important strength and motor control gains of pelvic floor 12/20/22 Goal status: DISCHARGED   3.  Pt will report no episodes of urinary incontinence in order to improve sleeping, confidence in community activities and personal hygiene.    Baseline:  Goal status: MET 12/20/22   4.  Pt will be able to go 2-3 hours in between voids without urgency or incontinence in order to improve QOL and perform all functional activities with less difficulty.    Baseline:  Goal status: MET 12/20/22     PLAN: PT FREQUENCY: 1x/week   PT DURATION: 12 weeks   PLANNED INTERVENTIONS: Therapeutic exercises, Therapeutic activity, Neuromuscular re-education, Balance training, Gait training, Patient/Family education, Joint mobilization, Dry Needling, Biofeedback, and Manual therapy   PLAN FOR NEXT SESSION:  Continue with quad rehab with some focus on proximal hip strength and core strength.   Sigurd Sos, PT 01/12/23 12:29 PM  [facility] [address], [place], [place] [address] Phone # [phone] Fax [phone]

## 2023-01-24 ENCOUNTER — Ambulatory Visit: Payer: Medicaid Other

## 2023-01-24 DIAGNOSIS — M6281 Muscle weakness (generalized): Secondary | ICD-10-CM | POA: Diagnosis not present

## 2023-01-24 DIAGNOSIS — M79604 Pain in right leg: Secondary | ICD-10-CM

## 2023-01-24 DIAGNOSIS — M5459 Other low back pain: Secondary | ICD-10-CM

## 2023-01-24 NOTE — Therapy (Signed)
OUTPATIENT PHYSICAL THERAPY TREATMENT NOTE   Patient Name: Linda Cunningham MRN: [id number] DOB:05/07/1966, 57 y.o., female Today's Date: 01/24/2023  PCP: [name], MD REFERRING PROVIDER: [name], MD  END OF SESSION:   PT End of Session - 01/24/23 1103     Visit Number 13    Date for PT Re-Evaluation 03/10/23    Authorization Type [employer] Medicaid    Authorization Time Period 27 visit limit    Authorization - Visit Number 13    Authorization - Number of Visits 27    PT Start Time [id number]   late   PT Stop Time 1101    PT Time Calculation (min) 33 min    Activity Tolerance Patient tolerated treatment well    Behavior During Therapy WFL for tasks assessed/performed                    Past Medical History:  Diagnosis Date   Abnormal nuclear stress test 09/25/2015   Chest pain 01/05/2015   Epigastric pain    Essential hypertension, benign 12/11/2013   Hypertension    Migraine    OSA on CPAP    Followed by Dr. Sima Matas with [facility]   Pain of upper extremity 01/07/2020   Pseudotumor cerebri    SOB (shortness of breath) 09/16/2016   Past Surgical History:  Procedure Laterality Date   CARDIAC CATHETERIZATION N/A 09/25/2015   Procedure: Left Heart Cath and Coronary Angiography;  Surgeon: Peter M Martinique, MD;  Location: [facility];  Service: Cardiovascular;  Laterality: N/A;   CESAREAN SECTION     Patient Active Problem List   Diagnosis Date Noted   Pain of upper extremity 01/07/2020   SOB (shortness of breath) 09/16/2016   OSA on CPAP    Abnormal nuclear stress test 09/25/2015   Chest pain 01/05/2015   Essential hypertension, benign 12/11/2013    REFERRING DIAG: N39.41 (ICD-10-CM) - Urge incontinence  THERAPY DIAG:  Muscle weakness (generalized) - Plan: PT plan of care cert/re-cert  Pain in right leg - Plan: PT plan of care cert/re-cert  Other low back pain - Plan: PT plan of care cert/re-cert  Rationale for Evaluation and Treatment  Rehabilitation  PERTINENT HISTORY: One c-section, bil knee pain  PRECAUTIONS: NA  SUBJECTIVE:                                                                                                                                                                                      SUBJECTIVE STATEMENT: Pt has been sick since last session so hasn't been able to do her exercises as much. Weather makes my back feel worse.  Pain in low back is less intense overall.    PAIN:  Are you having pain? Yes: NPRS scale: 4-5/10 Pain location: low back Pain description: tight Aggravating factors: worse in morning, standing Relieving factors: lying down  09/27/22 SUBJECTIVE STATEMENT:  Pt states that she is still having difficulty with urinary urgency and incontinence. She is having difficulty with frequency at night too, but she is trying not to get up due to it being cold. Having a very hard time with urge suppression technique and making it to he bathroom - specifically with cooking and housework.       Fluid intake: Yes:    PRECAUTIONS: None   WEIGHT BEARING RESTRICTIONS No   FALLS:  Has patient fallen in last 6 months? No   LIVING ENVIRONMENT: Lives with: lives with their family Lives in: House/apartment   OCCUPATION: not currently employed   PLOF: Independent   PATIENT GOALS decrease urinary leaking   PERTINENT HISTORY:  One c-section, bil knee pain Sexual abuse: No   PREGNANCY Vaginal deliveries 0 Tearing No C-section deliveries 1 Currently pregnant No    OBJECTIVE:    PATIENT SURVEYS:   PFIQ-7 - 67 01/24/23 ODI: 19/50, 38% disability   COGNITION:            Overall cognitive status: Within functional limits for tasks assessed                          SENSATION:            Light touch: Appears intact            Proprioception: Appears intact   GAIT:   Comments: antalgic gait pattern, SPC Rt LE                POSTURE: rounded shoulders and forward head     PALPATION:   General  No abdominal tenderness                 External Perineal Exam WNL                             Internal Pelvic Floor No tenderness in pelvic floor   Patient confirms identification and approves PT to assess internal pelvic floor and treatment Yes   PELVIC MMT: Pelvic floor strength 3/5 Endurance 4 seconds Repeat contractions 13x - poor motor control  PROLAPSE: Grade 1 anterior vaignal wall laxity   TODAY'S TREATMENT 01/24/23 Supine with feet on red physio ball horizontal abduction and D2 with yellow theraband 2x10 bil each Supine TA activation with ball squeeze: 5" hold 2x10 Seated LAQ  x 20 with 3 lbs Seated march x 20 with 3 lbs Seated hip ER with 3 lbs 2 x 10 each LE Sit to stand 2 x 5 with vc's for correct alignment Nustep: Level 4x 6 minutes-PT present to discuss progress  01/12/23 Supine with feet on red physio ball horizontal abduction and D2 with yellow theraband 2x10 bil each Supine with feet on red physio ball heel presses with pelvic floor contraction x 20 Supine with feet on red physio ball overhead pull downs x 20 with red tband with handles Supine TA activation with ball squeeze: 5" hold 2x10 Seated LAQ  x 20 with 3 lbs Seated march x 20 with 3 lbs Seated hip ER with 3 lbs 2 x 10 each LE Sit to stand 2 x 5 with vc's for  correct alignment Nustep: Level 4x 6 minutes-PT present to discuss progress   01/05/23 Supine with feet on red physio ball D2 right x 20 then left x 20 Supine with feet on red physio ball heel presses with pelvic floor contraction x 20 Supine with feet on red physio ball overhead pull downs x 20 with red tband with handles Supine with feet and knees at 90/90 degrees overhead pull downs with red tband with handles x 20 Seated LAQ  x 20 with 3 lbs Seated march x 20 with 3 lbs Seated hip ER with 3 lbs 2 x 10 each LE Sit to stand 2 x 5 with vc's for correct alignment Educated patient on concentric vs eccentric control and  importance of hip strength and alignment   PATIENT EDUCATION:  Education details: see above self-care Person educated: Patient Education method: Explanation, Demonstration, Tactile cues, Verbal cues, and Handouts Education comprehension: verbalized understanding     HOME EXERCISE PROGRAM: [id number]   ASSESSMENT:   CLINICAL IMPRESSION: Pt was sick after last session so she was not able to exercise as much.  She reports limitation due to both her knee and LBP with standing and walking.  ODI is 38% limitation. Pt will resume her HEP now that she is feeling better.  PT will continue to work on LE stability, core strength and flexibility to address LBP and LE pain.   Patient will benefit from skilled PT to address the below impairments and improve overall function.      OBJECTIVE IMPAIRMENTS decreased activity tolerance, decreased coordination, decreased endurance, decreased strength, increased fascial restrictions, increased muscle spasms, postural dysfunction, and pain.    ACTIVITY LIMITATIONS continence, housework, cooking   PARTICIPATION LIMITATIONS: interpersonal relationship and community activity   PERSONAL FACTORS 1 comorbidity: c-section  are also affecting patient's functional outcome.    REHAB POTENTIAL: Good   CLINICAL DECISION MAKING: Stable/uncomplicated   EVALUATION COMPLEXITY: Low     GOALS: Goals reviewed with patient? Yes   SHORT TERM GOALS:  Low back 02/17/23   Pt will be independent with HEP for low back and knee.    Baseline: Goal status: In progress        LONG TERM GOALS: 03/10/23   Pt will be independent with advanced HEP for LBP.    Baseline:  Goal status: IN PROGRESS   2.  stand for cooking with > or = 30% reduction in LBP   Baseline:  5/10-6/10 Goal status: NEW   3.  Improve ODI to < or = to 20% disability     Baseline: 38%  Goal status: NEW   4.  report 25% reduction in LE pain with standing tasks    Baseline:  Goal status: NEW      PLAN: PT FREQUENCY: 1x/week   PT DURATION: 6 weeks   PLANNED INTERVENTIONS: Therapeutic exercises, Therapeutic activity, Neuromuscular re-education, Balance training, Gait training, Patient/Family education, Joint mobilization, Dry Needling, Biofeedback, and Manual therapy   PLAN FOR NEXT SESSION:  LE strength and focus on proximal hip strength and core strength.   Sigurd Sos, PT 01/24/23 11:03 AM  [facility] [address] [place], [place] [address] Phone # [phone] Fax [phone]

## 2023-02-22 NOTE — Progress Notes (Unsigned)
   CC:  headaches  Follow-up Visit  Last visit: 09/22/22  Brief HPI: 57 year old female with a history of IIH, migraines, OSA, HTN, HLD who follows in clinic for migraines.   At her last visit she was restarted on Aimovig and continued on Topamax for migraine prevention. Baclofen was continued for rescue.  Interval History: She has not been able to get insurance approval for Aimovig. Currently is averaging 5 migraine days per month on Topamax 100 mg QHS. Topamax helps but causes paresthesias and cramping in her hands. She struggles to remember to take this consistently and typically only takes it 3 times per week. Baclofen continues to work for rescue. She will sometimes take this with Tylenol as well.   Migraine days per mont: 5 Headache free days per month: 25  Current Headache Regimen: Preventative: Topamax 100 mg QHS Abortive: baclofen 10 mg PRN   Prior Therapies                                  Prevention: Aimovig 140 mg monthly Topamax 100 mg QHS Amlodipine 5 mg daily Losartan 100 mg daily   Rescue: Maxalt 10 mg PRN Nurtec - lack of efficacy Ubrelvy - lack of efficacy Baclofen 10 mg TID PRN She has been told to avoid triptans due to HTN per cardiology and to avoid NSAIDs due to kidney function  Physical Exam:   Vital Signs: LMP 10/10/2016  GENERAL:  well appearing, in no acute distress, alert  SKIN:  Color, texture, turgor normal. No rashes or lesions HEAD:  Normocephalic/atraumatic. RESP: normal respiratory effort MSK:  No gross joint deformities.   NEUROLOGICAL: Mental Status: Alert, oriented to person, place and time, Follows commands, and Speech fluent and appropriate. Cranial Nerves: PERRL, face symmetric, no dysarthria, hearing grossly intact Motor: moves all extremities equally  IMPRESSION: 57 year old female with a history of IIH, migraines, OSA, HTN, HLD who presents for follow up of migraines. She continues to have 5 migraine days per month on  Topamax. Unable to get insurance approval for Aimovig. Will attempt to get Emgality approved for migraine prevention. Will continue Topamax for now, with the plan to try to wean if able to control headaches on Emgality.  PLAN: -Prevention: start Emgality 120 mg monthly, continue Topamax 100 mg QHS for now -Rescue: Continue baclofen 10 mg TID PRN, Tylenol PRN   Follow-up: 8 months  I spent a total of 20 minutes on the date of the service. Headache education was done. Discussed treatment options including preventive and acute medications, and natural supplements. Discussed medication side effects, adverse reactions and drug interactions. Written educational materials and patient instructions outlining all of the above were given.  Ocie Doyne, MD 02/23/23 1:35 PM

## 2023-02-23 ENCOUNTER — Ambulatory Visit: Payer: Medicaid Other | Admitting: Psychiatry

## 2023-02-23 VITALS — BP 125/87 | HR 73 | Ht 59.0 in | Wt 239.0 lb

## 2023-02-23 DIAGNOSIS — G43119 Migraine with aura, intractable, without status migrainosus: Secondary | ICD-10-CM | POA: Diagnosis not present

## 2023-02-23 MED ORDER — EMGALITY 120 MG/ML ~~LOC~~ SOAJ: 2.0000 | Freq: Once | SUBCUTANEOUS | 0 refills | Status: AC

## 2023-02-23 MED ORDER — TOPIRAMATE 100 MG PO TABS: 100.0000 mg | ORAL_TABLET | Freq: Every day | ORAL | 6 refills | Status: DC

## 2023-02-23 MED ORDER — BACLOFEN 10 MG PO TABS: 10.0000 mg | ORAL_TABLET | Freq: Three times a day (TID) | ORAL | 11 refills | Status: DC

## 2023-02-23 MED ORDER — EMGALITY 120 MG/ML ~~LOC~~ SOAJ: 1.0000 | SUBCUTANEOUS | 6 refills | Status: DC

## 2023-02-23 NOTE — Patient Instructions (Addendum)
Can take potassium 99 mg daily for tingling in the fingertips Can take magnesium oxide 500 mg at bedtime for cramping  Start Emgality monthly injection for migraine prevention

## 2023-02-24 ENCOUNTER — Telehealth: Payer: Self-pay | Admitting: Psychiatry

## 2023-02-24 NOTE — Telephone Encounter (Signed)
Pt called wanting to know the name of the medication over the counter that Dr. Delena Bali told her about.

## 2023-02-27 NOTE — Telephone Encounter (Signed)
Per note from Dr. Delena Bali 02/23/23: "Can take potassium 99 mg daily for tingling in the fingertips and can take magnesium oxide 500 mg at bedtime for cramping"   Phone room: please call pt back and relay above recommendation from Dr. Delena Bali

## 2023-02-27 NOTE — Telephone Encounter (Signed)
Called pt and informed her of this information. Pt verbalized understanding and was grateful for the advise.

## 2023-03-01 ENCOUNTER — Ambulatory Visit: Payer: Medicaid Other | Attending: Family Medicine

## 2023-03-01 DIAGNOSIS — M5459 Other low back pain: Secondary | ICD-10-CM | POA: Insufficient documentation

## 2023-03-01 DIAGNOSIS — R262 Difficulty in walking, not elsewhere classified: Secondary | ICD-10-CM | POA: Insufficient documentation

## 2023-03-01 DIAGNOSIS — M79604 Pain in right leg: Secondary | ICD-10-CM | POA: Insufficient documentation

## 2023-03-01 DIAGNOSIS — M6281 Muscle weakness (generalized): Secondary | ICD-10-CM | POA: Insufficient documentation

## 2023-03-01 NOTE — Therapy (Signed)
OUTPATIENT PHYSICAL THERAPY TREATMENT NOTE   Patient Name: Linda Cunningham MRN: [id number] DOB:01-26-1966, 57 y.o., female Today's Date: 03/01/2023  PCP: Macy Mis, MD REFERRING PROVIDER: Macy Mis, MD  END OF SESSION:   PT End of Session - 03/01/23 1441     Visit Number 14    Date for PT Re-Evaluation 03/10/23    Authorization Type [employer] Medicaid    Authorization Time Period 27 visit limit    Authorization - Visit Number 14    Authorization - Number of Visits 27    Progress Note Due on Visit 20    PT Start Time 1400    PT Stop Time 1443    PT Time Calculation (min) 43 min    Activity Tolerance Patient tolerated treatment well    Behavior During Therapy [facility] for tasks assessed/performed                     Past Medical History:  Diagnosis Date   Abnormal nuclear stress test 09/25/2015   Chest pain 01/05/2015   Epigastric pain    Essential hypertension, benign 12/11/2013   Hypertension    Migraine    OSA on CPAP    Followed by Dr. Catalina Lunger with [facility]   Pain of upper extremity 01/07/2020   Pseudotumor cerebri    SOB (shortness of breath) 09/16/2016   Past Surgical History:  Procedure Laterality Date   CARDIAC CATHETERIZATION N/A 09/25/2015   Procedure: Left Heart Cath and Coronary Angiography;  Surgeon: Peter M [place]az[place], MD;  Location: [facility];  Service: Cardiovascular;  Laterality: N/A;   CESAREAN SECTION     Patient Active Problem List   Diagnosis Date Noted   Pain of upper extremity 01/07/2020   SOB (shortness of breath) 09/16/2016   OSA on CPAP    Abnormal nuclear stress test 09/25/2015   Chest pain 01/05/2015   Essential hypertension, benign 12/11/2013    REFERRING DIAG: N39.41 (ICD-10-CM) - Urge incontinence  THERAPY DIAG:  Muscle weakness (generalized)  Pain in right leg  Other low back pain  Difficulty in walking, not elsewhere classified  Rationale for Evaluation and Treatment Rehabilitation  PERTINENT  HISTORY: One c-section, bil knee pain  PRECAUTIONS: NA  SUBJECTIVE:                                                                                                                                                                                      SUBJECTIVE STATEMENT: I haven't been here in a while because I thought I had to wait for the doctor to approve my visits.  I had to get injections into my  knees because I was having so much pain.      PAIN:  Are you having pain? Yes: NPRS scale: 5/10 Pain location: low back Pain description: tight Aggravating factors: worse in morning, standing Relieving factors: lying down  09/27/22 SUBJECTIVE STATEMENT:  Pt states that she is still having difficulty with urinary urgency and incontinence. She is having difficulty with frequency at night too, but she is trying not to get up due to it being cold. Having a very hard time with urge suppression technique and making it to he bathroom - specifically with cooking and housework.       Fluid intake: Yes:    PRECAUTIONS: None   WEIGHT BEARING RESTRICTIONS No   FALLS:  Has patient fallen in last 6 months? No   LIVING ENVIRONMENT: Lives with: lives with their family Lives in: House/apartment   OCCUPATION: not currently employed   PLOF: Independent   PATIENT GOALS decrease urinary leaking   PERTINENT HISTORY:  One c-section, bil knee pain Sexual abuse: No   PREGNANCY Vaginal deliveries 0 Tearing No C-section deliveries 1 Currently pregnant No    OBJECTIVE:    PATIENT SURVEYS:   PFIQ-7 - 67 01/24/23 ODI: 19/50, 38% disability   COGNITION:            Overall cognitive status: Within functional limits for tasks assessed                          SENSATION:            Light touch: Appears intact            Proprioception: Appears intact   GAIT:   Comments: antalgic gait pattern, SPC Rt LE                POSTURE: rounded shoulders and forward head    PALPATION:   General   No abdominal tenderness                 External Perineal Exam WNL                             Internal Pelvic Floor No tenderness in pelvic floor   Patient confirms identification and approves PT to assess internal pelvic floor and treatment Yes   PELVIC MMT: Pelvic floor strength 3/5 Endurance 4 seconds Repeat contractions 13x - poor motor control  PROLAPSE: Grade 1 anterior vaignal wall laxity   TODAY'S TREATMENT 03/01/23 Nustep: Level 4x 6 minutes-PT present to discuss progress  Seated hamstring and figure 4 stretch 3x20 seconds  Supine TA activation with ball squeeze: 5" hold 2x10 Supine hip abduction with yellow band 2x10 Seated LAQ  x 20 with 3 lbs Low trunk rotation 3x20 seconds  standing march on balance pad x 20 with 3 lbs Seated hip ER with 3 lbs 2 x 10 each LE Sit to stand 2 x 10 with curing for glute and TA activation Weight shifting on balance pad x1 min each   01/24/23 Supine with feet on red physio ball horizontal abduction and D2 with yellow theraband 2x10 bil each Supine TA activation with ball squeeze: 5" hold 2x10 Seated LAQ  x 20 with 3 lbs Seated march x 20 with 3 lbs Seated hip ER with 3 lbs 2 x 10 each LE Sit to stand 2 x 5 with vc's for correct alignment Nustep: Level 4x 6 minutes-PT present to discuss  progress  01/12/23 Supine with feet on red physio ball horizontal abduction and D2 with yellow theraband 2x10 bil each Supine with feet on red physio ball heel presses with pelvic floor contraction x 20 Supine with feet on red physio ball overhead pull downs x 20 with red tband with handles Supine TA activation with ball squeeze: 5" hold 2x10 Seated LAQ  x 20 with 3 lbs Seated march x 20 with 3 lbs Seated hip ER with 3 lbs 2 x 10 each LE Sit to stand 2 x 5 with vc's for correct alignment Nustep: Level 4x 6 minutes-PT present to discuss progress   PATIENT EDUCATION:  Education details: see above self-care Person educated: Patient Education method:  Explanation, Demonstration, Tactile cues, Verbal cues, and Handouts Education comprehension: verbalized understanding     HOME EXERCISE PROGRAM: [id number]   ASSESSMENT:   CLINICAL IMPRESSION: Lapse in treatment x 5 weeks.  Pt reports that she has had a lot of knee pain and she got injections that helped for a few days.   She reports limitation due to both her knee and LBP with standing and walking.  Pt reports moderate compliance with HEP and PT emphasized the importance of doing these.  Limited change in symptoms due to lack of compliance with program.  Pt was able to participate in all exercises in the clinic without increased pain.  PT will continue to work on LE stability, core strength and flexibility to address LBP and LE pain.   Patient will benefit from skilled PT to address the below impairments and improve overall function.      OBJECTIVE IMPAIRMENTS decreased activity tolerance, decreased coordination, decreased endurance, decreased strength, increased fascial restrictions, increased muscle spasms, postural dysfunction, and pain.    ACTIVITY LIMITATIONS continence, housework, cooking   PARTICIPATION LIMITATIONS: interpersonal relationship and community activity   PERSONAL FACTORS 1 comorbidity: c-section  are also affecting patient's functional outcome.    REHAB POTENTIAL: Good   CLINICAL DECISION MAKING: Stable/uncomplicated   EVALUATION COMPLEXITY: Low     GOALS: Goals reviewed with patient? Yes   SHORT TERM GOALS:  Low back 02/17/23   Pt will be independent with HEP for low back and knee.    Baseline: Goal status: In progress    LONG TERM GOALS: 03/10/23   Pt will be independent with advanced HEP for LBP.    Baseline:  Goal status: IN PROGRESS   2.  stand for cooking with > or = 30% reduction in LBP   Baseline:  5/10-6/10 Goal status: NEW   3.  Improve ODI to < or = to 20% disability     Baseline: 38%  Goal status: NEW   4.  report 25% reduction in LE  pain with standing tasks    Baseline:  Goal status: NEW     PLAN: PT FREQUENCY: 1x/week   PT DURATION: 6 weeks   PLANNED INTERVENTIONS: Therapeutic exercises, Therapeutic activity, Neuromuscular re-education, Balance training, Gait training, Patient/Family education, Joint mobilization, Dry Needling, Biofeedback, and Manual therapy   PLAN FOR NEXT SESSION:  LE strength and focus on proximal hip strength and core strength. ERO next session due to end of POC.    Lorrene Reid, PT 03/01/23 2:43 PM  [facility] [address], [address] [place], [place] [address] Phone # [phone] Fax [phone]

## 2023-03-08 ENCOUNTER — Ambulatory Visit: Payer: Medicaid Other | Attending: Family Medicine

## 2023-03-08 DIAGNOSIS — R262 Difficulty in walking, not elsewhere classified: Secondary | ICD-10-CM | POA: Diagnosis present

## 2023-03-08 DIAGNOSIS — M25562 Pain in left knee: Secondary | ICD-10-CM | POA: Diagnosis present

## 2023-03-08 DIAGNOSIS — M170 Bilateral primary osteoarthritis of knee: Secondary | ICD-10-CM | POA: Diagnosis present

## 2023-03-08 DIAGNOSIS — M5459 Other low back pain: Secondary | ICD-10-CM | POA: Diagnosis present

## 2023-03-08 DIAGNOSIS — G8929 Other chronic pain: Secondary | ICD-10-CM | POA: Insufficient documentation

## 2023-03-08 DIAGNOSIS — M6281 Muscle weakness (generalized): Secondary | ICD-10-CM | POA: Diagnosis present

## 2023-03-08 DIAGNOSIS — R252 Cramp and spasm: Secondary | ICD-10-CM | POA: Insufficient documentation

## 2023-03-08 DIAGNOSIS — M79604 Pain in right leg: Secondary | ICD-10-CM | POA: Insufficient documentation

## 2023-03-08 DIAGNOSIS — R293 Abnormal posture: Secondary | ICD-10-CM | POA: Diagnosis present

## 2023-03-08 DIAGNOSIS — M62838 Other muscle spasm: Secondary | ICD-10-CM | POA: Insufficient documentation

## 2023-03-08 NOTE — Therapy (Signed)
OUTPATIENT PHYSICAL THERAPY RECERT NOTE   Patient Name: DANIJAH NOH MRN: [id number] DOB:[birth year], 57 y.o., female Today's Date: 03/08/2023  PCP: Macy Mis, MD REFERRING PROVIDER: Macy Mis, MD  END OF SESSION:   PT End of Session - 03/08/23 1153     Visit Number 15    Date for PT Re-Evaluation 03/10/23    Authorization Type [employer] Medicaid    Authorization Time Period 27 visit limit    Authorization - Visit Number 15    Authorization - Number of Visits 27    Progress Note Due on Visit 20    PT Start Time 1153    PT Stop Time 1238    PT Time Calculation (min) 45 min    Activity Tolerance Patient tolerated treatment well    Behavior During Therapy [facility] for tasks assessed/performed                     Past Medical History:  Diagnosis Date   Abnormal nuclear stress test 09/25/2015   Chest pain 01/05/2015   Epigastric pain    Essential hypertension, benign 12/11/2013   Hypertension    Migraine    OSA on CPAP    Followed by Dr. Catalina Lunger with [facility]   Pain of upper extremity 01/07/2020   Pseudotumor cerebri    SOB (shortness of breath) 09/16/2016   Past Surgical History:  Procedure Laterality Date   CARDIAC CATHETERIZATION N/A 09/25/2015   Procedure: Left Heart Cath and Coronary Angiography;  Surgeon: Peter M Swaziland, MD;  Location: [facility] INVASIVE CV LAB;  Service: Cardiovascular;  Laterality: N/A;   CESAREAN SECTION     Patient Active Problem List   Diagnosis Date Noted   Pain of upper extremity 01/07/2020   SOB (shortness of breath) 09/16/2016   OSA on CPAP    Abnormal nuclear stress test 09/25/2015   Chest pain 01/05/2015   Essential hypertension, benign 12/11/2013    REFERRING DIAG: N39.41 (ICD-10-CM) - Urge incontinence  THERAPY DIAG:  Muscle weakness (generalized)  Other low back pain  Pain in right leg  Difficulty in walking, not elsewhere classified  Bilateral primary osteoarthritis of knee  Other muscle  spasm  Rationale for Evaluation and Treatment Rehabilitation  PERTINENT HISTORY: One c-section, bil knee pain  PRECAUTIONS: NA  SUBJECTIVE:                                                                                                                                                                                      SUBJECTIVE STATEMENT: Patient reports she feels like the injections helped.  She reports her pain level at 3/10.  PAIN:  03/08/23: Are you having pain? Yes: NPRS scale: 3/10 Pain location: low back Pain description: tight Aggravating factors: worse in morning, standing Relieving factors: lying down  09/27/22 SUBJECTIVE STATEMENT:  Pt states that she is still having difficulty with urinary urgency and incontinence. She is having difficulty with frequency at night too, but she is trying not to get up due to it being cold. Having a very hard time with urge suppression technique and making it to he bathroom - specifically with cooking and housework.       Fluid intake: Yes:    PRECAUTIONS: None   WEIGHT BEARING RESTRICTIONS No   FALLS:  Has patient fallen in last 6 months? No   LIVING ENVIRONMENT: Lives with: lives with their family Lives in: House/apartment   OCCUPATION: not currently employed   PLOF: Independent   PATIENT GOALS decrease urinary leaking   PERTINENT HISTORY:  One c-section, bil knee pain Sexual abuse: No   PREGNANCY Vaginal deliveries 0 Tearing No C-section deliveries 1 Currently pregnant No    OBJECTIVE:    PATIENT SURVEYS:   PFIQ-7 - 67 01/24/23 ODI: 19/50, 38% disability 03/08/23: ODI: 10/50, 20% disability   COGNITION:            Overall cognitive status: Within functional limits for tasks assessed                          SENSATION:            Light touch: Appears intact            Proprioception: Appears intact   GAIT:   Comments: antalgic gait pattern, SPC Rt LE    03/08/23: continued antalgic gait with right LE  ER, short step length using SPC in right UE,  step to gait on steps            MMT LE : Generally 4-/5 with exception of left quad which is 3+/5,  bilateral     POSTURE: rounded shoulders and forward head    PALPATION:   General  No abdominal tenderness                 External Perineal Exam WNL                             Internal Pelvic Floor No tenderness in pelvic floor   Patient confirms identification and approves PT to assess internal pelvic floor and treatment Yes   PELVIC MMT: Pelvic floor strength 3/5 Endurance 4 seconds Repeat contractions 13x - poor motor control  PROLAPSE: Grade 1 anterior vaignal wall laxity   TODAY'S TREATMENT 03/08/23 Nustep: Level 4 x 6 minutes- PT present to discuss progress  Seated hamstring stretch 3x20 seconds  Seated LAQ  x 20 with 3 lbs Quad progression: Seated LAQ x 20 with 3 lbs, Supine quad sets, Supine TKE with half roll,  Supine TKE with blue foam roller with 3 lbs Supine TA activation with ball squeeze: 5" hold 2x10 Supine hip abduction with yellow band 2x10 McConnel tape for medial patellar glide left knee- had patient walk for approx 30 feet to assess response.   Recert assessment completed   03/01/23 Nustep: Level 4x 6 minutes-PT present to discuss progress  Seated hamstring and figure 4 stretch 3x20 seconds  Supine TA activation with ball squeeze: 5" hold 2x10 Supine hip abduction with yellow band 2x10 Seated LAQ  x 20 with 3 lbs Low trunk rotation 3x20 seconds  standing march on balance pad x 20 with 3 lbs Seated hip ER with 3 lbs 2 x 10 each LE Sit to stand 2 x 10 with curing for glute and TA activation Weight shifting on balance pad x1 min each   01/24/23 Supine with feet on red physio ball horizontal abduction and D2 with yellow theraband 2x10 bil each Supine TA activation with ball squeeze: 5" hold 2x10 Seated LAQ  x 20 with 3 lbs Seated march x 20 with 3 lbs Seated hip ER with 3 lbs 2 x 10 each LE Sit to stand 2  x 5 with vc's for correct alignment Nustep: Level 4x 6 minutes-PT present to discuss progress   PATIENT EDUCATION:  Education details: see above self-care Person educated: Patient Education method: Explanation, Demonstration, Tactile cues, Verbal cues, and Handouts Education comprehension: verbalized understanding     HOME EXERCISE PROGRAM: Access Code: [email or web address] URL: [email or web address] Date: 03/08/2023 Prepared by: Mikey Kirschner  Exercises - Supine Pelvic Floor Contraction  - 3 x daily - 7 x weekly - 1 sets - 5 reps - 10 hold - Quick Flick Pelvic Floor Contractions in Hooklying  - 3 x daily - 7 x weekly - 1 sets - 10 reps - Seated March  - 1 x daily - 7 x weekly - 3 sets - 10 reps - Seated Hip Adduction Isometrics with Ball  - 1 x daily - 7 x weekly - 3 sets - 10 reps - Seated Hip Abduction with Resistance  - 1 x daily - 7 x weekly - 3 sets - 10 reps - Seated Single Shoulder PNF D2 with Resistance  - 1 x daily - 7 x weekly - 1 sets - 10 reps - Seated pull down (pull down and out with your exhale)  - 1 x daily - 7 x weekly - 2 sets - 10 reps - Standing Hamstring Stretch on Chair  - 1 x daily - 7 x weekly - 1 sets - 3 reps - 20 sec hold - Quadricep Stretch with Chair and Counter Support  - 1 x daily - 7 x weekly - 1 sets - 3 reps - 20 sec hold - Sit to Stand Without Arm Support  - 1 x daily - 7 x weekly - 2 sets - 10 reps - Side Stepping with Resistance at Ankles  - 1 x daily - 7 x weekly - 3 sets - 10 reps - Seated Long Arc Quad  - 1 x daily - 7 x weekly - 3 sets - 10 reps - Supine Quadricep Sets  - 1 x daily - 7 x weekly - 3 sets - 10 reps - Supine Quad Set  - 1 x daily - 7 x weekly - 3 sets - 10 reps   ASSESSMENT:   CLINICAL IMPRESSION: Francessca is just starting back on her PT.  She has decreased bilateral knee pain since injections but left quad is still very weak.  She was unable to do proper quad set and had significant pain with SAQ with and without  resistance.  This pain was slightly reduced by manual medial patellar glide but patient became tearful when trying to push through TKE's with medial glide.  We decided to do medial glide using McConnel taping technique.  She seemed to have slight relief with walking after taping.   Patient will benefit from skilled PT to address the below impairments and improve overall function.  OBJECTIVE IMPAIRMENTS decreased activity tolerance, decreased coordination, decreased endurance, decreased strength, increased fascial restrictions, increased muscle spasms, postural dysfunction, and pain.    ACTIVITY LIMITATIONS continence, housework, cooking   PARTICIPATION LIMITATIONS: interpersonal relationship and community activity   PERSONAL FACTORS 1 comorbidity: c-section  are also affecting patient's functional outcome.    REHAB POTENTIAL: Good   CLINICAL DECISION MAKING: Stable/uncomplicated   EVALUATION COMPLEXITY: Low     GOALS: Goals reviewed with patient? Yes   SHORT TERM GOALS:  Low back 02/17/23   Pt will be independent with HEP for low back and knee.    Baseline: Goal status: In progress    LONG TERM GOALS: 03/10/23   Pt will be independent with advanced HEP for LBP.    Baseline:  Goal status: IN PROGRESS   2.  stand for cooking with > or = 30% reduction in LBP   Baseline:  5/10-6/10 Goal status: NEW   3.  Improve ODI to < or = to 20% disability     Baseline: 38%  Goal status: NEW   4.  report 25% reduction in LE pain with standing tasks    Baseline:  Goal status: NEW     PLAN: PT FREQUENCY: 1x/week   PT DURATION: additional 8 weeks (Recert POC 03/08/23 through 05/03/2023)   PLANNED INTERVENTIONS: Therapeutic exercises, Therapeutic activity, Neuromuscular re-education, Balance training, Gait training, Patient/Family education, Joint mobilization, Dry Needling, Biofeedback, and Manual therapy   PLAN FOR NEXT SESSION:  Quad rehab, Left quad is extremely weak. LE  strength and focus on proximal hip strength and core strength.   Victorino Dike B. Haider Hornaday, PT 03/08/23 4:32 PM  [facility] [address], [address] [place], [place] [address] Phone # [phone] Fax [phone]

## 2023-03-22 ENCOUNTER — Ambulatory Visit: Payer: Medicaid Other

## 2023-03-22 DIAGNOSIS — M79604 Pain in right leg: Secondary | ICD-10-CM

## 2023-03-22 DIAGNOSIS — M6281 Muscle weakness (generalized): Secondary | ICD-10-CM

## 2023-03-22 DIAGNOSIS — M170 Bilateral primary osteoarthritis of knee: Secondary | ICD-10-CM

## 2023-03-22 DIAGNOSIS — R252 Cramp and spasm: Secondary | ICD-10-CM

## 2023-03-22 DIAGNOSIS — R262 Difficulty in walking, not elsewhere classified: Secondary | ICD-10-CM

## 2023-03-22 DIAGNOSIS — M5459 Other low back pain: Secondary | ICD-10-CM

## 2023-03-22 DIAGNOSIS — M62838 Other muscle spasm: Secondary | ICD-10-CM

## 2023-03-22 NOTE — Therapy (Signed)
OUTPATIENT PHYSICAL THERAPY RECERT NOTE   Patient Name: Linda Cunningham MRN: [id number] DOB:09-25-1966, 57 y.o., female Today's Date: 03/22/2023  PCP: Macy Mis, MD REFERRING PROVIDER: Macy Mis, MD  END OF SESSION:   PT End of Session - 03/22/23 1609     Visit Number 16    Date for PT Re-Evaluation 05/03/23    Authorization Type [employer] Medicaid    Authorization Time Period 27 visit limit    Authorization - Visit Number 16    PT Start Time 1524    PT Stop Time 1635    PT Time Calculation (min) 71 min    Activity Tolerance Patient tolerated treatment well    Behavior During Therapy [facility] for tasks assessed/performed                     Past Medical History:  Diagnosis Date   Abnormal nuclear stress test 09/25/2015   Chest pain 01/05/2015   Epigastric pain    Essential hypertension, benign 12/11/2013   Hypertension    Migraine    OSA on CPAP    Followed by Dr. Catalina Lunger with [facility]   Pain of upper extremity 01/07/2020   Pseudotumor cerebri    SOB (shortness of breath) 09/16/2016   Past Surgical History:  Procedure Laterality Date   CARDIAC CATHETERIZATION N/A 09/25/2015   Procedure: Left Heart Cath and Coronary Angiography;  Surgeon: Peter M Swaziland, MD;  Location: [facility] INVASIVE CV LAB;  Service: Cardiovascular;  Laterality: N/A;   CESAREAN SECTION     Patient Active Problem List   Diagnosis Date Noted   Pain of upper extremity 01/07/2020   SOB (shortness of breath) 09/16/2016   OSA on CPAP    Abnormal nuclear stress test 09/25/2015   Chest pain 01/05/2015   Essential hypertension, benign 12/11/2013    REFERRING DIAG: N39.41 (ICD-10-CM) - Urge incontinence  THERAPY DIAG:  Muscle weakness (generalized)  Other low back pain  Pain in right leg  Difficulty in walking, not elsewhere classified  Bilateral primary osteoarthritis of knee  Other muscle spasm  Cramp and spasm  Rationale for Evaluation and Treatment  Rehabilitation  PERTINENT HISTORY: One c-section, bil knee pain  PRECAUTIONS: NA  SUBJECTIVE:                                                                                                                                                                                      SUBJECTIVE STATEMENT: Patient reports the tape fell off.  She continues to have low back, hip and knee pain but it varies between 3-5/10   PAIN:  03/22/23: Are you having pain? Yes: NPRS  scale: 3-5/10 Pain location: low back Pain description: tight Aggravating factors: worse in morning, standing Relieving factors: lying down  09/27/22 SUBJECTIVE STATEMENT:  Pt states that she is still having difficulty with urinary urgency and incontinence. She is having difficulty with frequency at night too, but she is trying not to get up due to it being cold. Having a very hard time with urge suppression technique and making it to he bathroom - specifically with cooking and housework.       Fluid intake: Yes:    PRECAUTIONS: None   WEIGHT BEARING RESTRICTIONS No   FALLS:  Has patient fallen in last 6 months? No   LIVING ENVIRONMENT: Lives with: lives with their family Lives in: House/apartment   OCCUPATION: not currently employed   PLOF: Independent   PATIENT GOALS decrease urinary leaking   PERTINENT HISTORY:  One c-section, bil knee pain Sexual abuse: No   PREGNANCY Vaginal deliveries 0 Tearing No C-section deliveries 1 Currently pregnant No    OBJECTIVE:    PATIENT SURVEYS:   PFIQ-7 - 67 01/24/23 ODI: 19/50, 38% disability 03/08/23: ODI: 10/50, 20% disability   COGNITION:            Overall cognitive status: Within functional limits for tasks assessed                          SENSATION:            Light touch: Appears intact            Proprioception: Appears intact   GAIT:   Comments: antalgic gait pattern, SPC Rt LE    03/08/23: continued antalgic gait with right LE ER, short step length  using SPC in right UE,  step to gait on steps            MMT LE : Generally 4-/5 with exception of left quad which is 3+/5,  bilateral     POSTURE: rounded shoulders and forward head    PALPATION:   General  No abdominal tenderness                 External Perineal Exam WNL                             Internal Pelvic Floor No tenderness in pelvic floor   Patient confirms identification and approves PT to assess internal pelvic floor and treatment Yes   PELVIC MMT: Pelvic floor strength 3/5 Endurance 4 seconds Repeat contractions 13x - poor motor control  PROLAPSE: Grade 1 anterior vaignal wall laxity   TODAY'S TREATMENT 03/22/23 Nustep: Level 4 x 6 minutes- PT present to discuss progress - Lengthy discussion regarding anatomy of the lumbar spine and pelvis and how her LE issues are likely contributing to her low back pain  Kinesio tape for patellar stability: both Seated LAQ  x 20 with 0 lbs Quad progression: Supine quad sets, Supine TKE with half roll,  Supine TKE with blue foam roller with 0 lbs (attempted SAQ's with isometric hip adduction but patient continued to have pain): all with kinesio tape 15-20 min spent educating patient on anatomy of the knee and how degenerative changes occur.  Pointed out to patient that, on her left knee, she has severe external tibial torsion and almost slight IR of the femur.  Visual examples shown on computer.   03/08/23 Nustep: Level 4 x 6 minutes- PT present  to discuss progress  Seated hamstring stretch 3x20 seconds  Seated LAQ  x 20 with 3 lbs Quad progression: Seated LAQ x 20 with 3 lbs, Supine quad sets, Supine TKE with half roll,  Supine TKE with blue foam roller with 3 lbs Supine TA activation with ball squeeze: 5" hold 2x10 Supine hip abduction with yellow band 2x10 McConnel tape for medial patellar glide left knee- had patient walk for approx 30 feet to assess response.   Recert assessment completed   03/01/23 Nustep: Level 4x 6  minutes-PT present to discuss progress  Seated hamstring and figure 4 stretch 3x20 seconds  Supine TA activation with ball squeeze: 5" hold 2x10 Supine hip abduction with yellow band 2x10 Seated LAQ  x 20 with 3 lbs Low trunk rotation 3x20 seconds  standing march on balance pad x 20 with 3 lbs Seated hip ER with 3 lbs 2 x 10 each LE Sit to stand 2 x 10 with curing for glute and TA activation Weight shifting on balance pad x1 min each     HOME EXERCISE PROGRAM: Access Code: [email or web address] URL: [email or web address] Date: 03/08/2023 Prepared by: Mikey Kirschner  Exercises - Supine Pelvic Floor Contraction  - 3 x daily - 7 x weekly - 1 sets - 5 reps - 10 hold - Quick Flick Pelvic Floor Contractions in Hooklying  - 3 x daily - 7 x weekly - 1 sets - 10 reps - Seated March  - 1 x daily - 7 x weekly - 3 sets - 10 reps - Seated Hip Adduction Isometrics with Ball  - 1 x daily - 7 x weekly - 3 sets - 10 reps - Seated Hip Abduction with Resistance  - 1 x daily - 7 x weekly - 3 sets - 10 reps - Seated Single Shoulder PNF D2 with Resistance  - 1 x daily - 7 x weekly - 1 sets - 10 reps - Seated pull down (pull down and out with your exhale)  - 1 x daily - 7 x weekly - 2 sets - 10 reps - Standing Hamstring Stretch on Chair  - 1 x daily - 7 x weekly - 1 sets - 3 reps - 20 sec hold - Quadricep Stretch with Chair and Counter Support  - 1 x daily - 7 x weekly - 1 sets - 3 reps - 20 sec hold - Sit to Stand Without Arm Support  - 1 x daily - 7 x weekly - 2 sets - 10 reps - Side Stepping with Resistance at Ankles  - 1 x daily - 7 x weekly - 3 sets - 10 reps - Seated Long Arc Quad  - 1 x daily - 7 x weekly - 3 sets - 10 reps - Supine Quadricep Sets  - 1 x daily - 7 x weekly - 3 sets - 10 reps - Supine Quad Set  - 1 x daily - 7 x weekly - 3 sets - 10 reps   ASSESSMENT:   CLINICAL IMPRESSION: Addeline has severe left knee alignment issues.  She was able to tolerate more with taping technique today.   She demonstrated active SAQ with fairly good quad control with the tape vs being in tears last session when attempting SAQ. We spent a lengthy amount of time educating patient today so that she understands how the quad is inhibited by pain and thus causing the tracking issues and how it is crucial that we get better quad strength.  Patient will benefit from  skilled PT to address the below impairments and improve overall function.      OBJECTIVE IMPAIRMENTS decreased activity tolerance, decreased coordination, decreased endurance, decreased strength, increased fascial restrictions, increased muscle spasms, postural dysfunction, and pain.    ACTIVITY LIMITATIONS continence, housework, cooking   PARTICIPATION LIMITATIONS: interpersonal relationship and community activity   PERSONAL FACTORS 1 comorbidity: c-section  are also affecting patient's functional outcome.    REHAB POTENTIAL: Good   CLINICAL DECISION MAKING: Stable/uncomplicated   EVALUATION COMPLEXITY: Low     GOALS: Goals reviewed with patient? Yes   SHORT TERM GOALS:  Low back 02/17/23   Pt will be independent with HEP for low back and knee.    Baseline: Goal status: In progress    LONG TERM GOALS: 03/10/23   Pt will be independent with advanced HEP for LBP.    Baseline:  Goal status: IN PROGRESS   2.  stand for cooking with > or = 30% reduction in LBP   Baseline:  5/10-6/10 Goal status: NEW   3.  Improve ODI to < or = to 20% disability     Baseline: 38%  Goal status: NEW   4.  report 25% reduction in LE pain with standing tasks    Baseline:  Goal status: NEW     PLAN: PT FREQUENCY: 1x/week   PT DURATION: additional 8 weeks (Recert POC 03/08/23 through 05/03/2023)   PLANNED INTERVENTIONS: Therapeutic exercises, Therapeutic activity, Neuromuscular re-education, Balance training, Gait training, Patient/Family education, Joint mobilization, Dry Needling, Biofeedback, and Manual therapy   PLAN FOR NEXT  SESSION:  Continue Quad rehab, taping for patellar stability, Left quad is extremely weak. LE strength and focus on proximal hip strength and core strength.   Victorino Dike B. Jaylyne Breese, PT 03/22/23 4:53 PM  [facility] [address], [address] [place], [place] [address] Phone # [phone] Fax [phone]

## 2023-03-27 ENCOUNTER — Other Ambulatory Visit (HOSPITAL_COMMUNITY): Payer: Self-pay

## 2023-03-27 ENCOUNTER — Telehealth: Payer: Self-pay

## 2023-03-27 NOTE — Telephone Encounter (Signed)
Pharmacy Patient Advocate Encounter   Received notification from [facility] that prior authorization for Emgality 120MG /ML auto-injectors (migraine) Loading Dose is required/requested.   PA submitted on 03/27/2023 to (ins) [employer] via CoverMyMeds Key or (Medicaid) confirmation #  [id number] Status is pending

## 2023-03-29 ENCOUNTER — Ambulatory Visit: Payer: Medicaid Other

## 2023-03-29 DIAGNOSIS — M62838 Other muscle spasm: Secondary | ICD-10-CM

## 2023-03-29 DIAGNOSIS — M6281 Muscle weakness (generalized): Secondary | ICD-10-CM

## 2023-03-29 DIAGNOSIS — M170 Bilateral primary osteoarthritis of knee: Secondary | ICD-10-CM

## 2023-03-29 DIAGNOSIS — G8929 Other chronic pain: Secondary | ICD-10-CM

## 2023-03-29 DIAGNOSIS — R293 Abnormal posture: Secondary | ICD-10-CM

## 2023-03-29 DIAGNOSIS — R262 Difficulty in walking, not elsewhere classified: Secondary | ICD-10-CM

## 2023-03-29 DIAGNOSIS — M5459 Other low back pain: Secondary | ICD-10-CM

## 2023-03-29 NOTE — Therapy (Signed)
OUTPATIENT PHYSICAL THERAPY RECERT NOTE   Patient Name: Linda Cunningham MRN: [id number] DOB:July 29, 1966, 57 y.o., female Today's Date: 03/29/2023  PCP: Macy Mis, MD REFERRING PROVIDER: Macy Mis, MD  END OF SESSION:   PT End of Session - 03/29/23 1006     Visit Number 17    Date for PT Re-Evaluation 05/03/23    Authorization Type [employer] Medicaid    Authorization Time Period 27 visit limit    Authorization - Visit Number 17    Authorization - Number of Visits 27    Progress Note Due on Visit 20    PT Start Time 0933    PT Stop Time 1017    PT Time Calculation (min) 44 min    Activity Tolerance Patient limited by pain    Behavior During Therapy [facility] for tasks assessed/performed                     Past Medical History:  Diagnosis Date   Abnormal nuclear stress test 09/25/2015   Chest pain 01/05/2015   Epigastric pain    Essential hypertension, benign 12/11/2013   Hypertension    Migraine    OSA on CPAP    Followed by Dr. Catalina Lunger with [facility]   Pain of upper extremity 01/07/2020   Pseudotumor cerebri    SOB (shortness of breath) 09/16/2016   Past Surgical History:  Procedure Laterality Date   CARDIAC CATHETERIZATION N/A 09/25/2015   Procedure: Left Heart Cath and Coronary Angiography;  Surgeon: Peter M Swaziland, MD;  Location: [facility] INVASIVE CV LAB;  Service: Cardiovascular;  Laterality: N/A;   CESAREAN SECTION     Patient Active Problem List   Diagnosis Date Noted   Pain of upper extremity 01/07/2020   SOB (shortness of breath) 09/16/2016   OSA on CPAP    Abnormal nuclear stress test 09/25/2015   Chest pain 01/05/2015   Essential hypertension, benign 12/11/2013    REFERRING DIAG: N39.41 (ICD-10-CM) - Urge incontinence  THERAPY DIAG:  Muscle weakness (generalized)  Chronic pain of left knee  Other low back pain  Difficulty in walking, not elsewhere classified  Bilateral primary osteoarthritis of knee  Other muscle spasm  Abnormal  posture  Rationale for Evaluation and Treatment Rehabilitation  PERTINENT HISTORY: One c-section, bil knee pain  PRECAUTIONS: NA  SUBJECTIVE:                                                                                                                                                                                      SUBJECTIVE STATEMENT: Patient reports the new tape stayed on a little longer and it seems to help  a little.   She continues to have low back, hip and left  knee pain varying at 3-5/10   PAIN:  03/29/23: Are you having pain? Yes: NPRS scale: 3-5/10 Pain location: low back Pain description: tight Aggravating factors: worse in morning, standing Relieving factors: lying down  09/27/22 SUBJECTIVE STATEMENT:  Pt states that she is still having difficulty with urinary urgency and incontinence. She is having difficulty with frequency at night too, but she is trying not to get up due to it being cold. Having a very hard time with urge suppression technique and making it to he bathroom - specifically with cooking and housework.       Fluid intake: Yes:    PRECAUTIONS: None   WEIGHT BEARING RESTRICTIONS No   FALLS:  Has patient fallen in last 6 months? No   LIVING ENVIRONMENT: Lives with: lives with their family Lives in: House/apartment   OCCUPATION: not currently employed   PLOF: Independent   PATIENT GOALS decrease urinary leaking   PERTINENT HISTORY:  One c-section, bil knee pain Sexual abuse: No   PREGNANCY Vaginal deliveries 0 Tearing No C-section deliveries 1 Currently pregnant No    OBJECTIVE:    PATIENT SURVEYS:   PFIQ-7 - 67 01/24/23 ODI: 19/50, 38% disability 03/08/23: ODI: 10/50, 20% disability   COGNITION:            Overall cognitive status: Within functional limits for tasks assessed                          SENSATION:            Light touch: Appears intact            Proprioception: Appears intact   GAIT:   Comments: antalgic  gait pattern, SPC Rt LE    03/08/23: continued antalgic gait with right LE ER, short step length using SPC in right UE,  step to gait on steps            MMT LE : Generally 4-/5 with exception of left quad which is 3+/5,  bilateral     POSTURE: rounded shoulders and forward head    PALPATION:   General  No abdominal tenderness                 External Perineal Exam WNL                             Internal Pelvic Floor No tenderness in pelvic floor   Patient confirms identification and approves PT to assess internal pelvic floor and treatment Yes   PELVIC MMT: Pelvic floor strength 3/5 Endurance 4 seconds Repeat contractions 13x - poor motor control  PROLAPSE: Grade 1 anterior vaignal wall laxity   TODAY'S TREATMENT 03/29/23 Nustep: Level 4 x 5 minutes- PT present to discuss progress - Kinesio tape for patellar stability: both (patient had forgotten to avoid applying lotion this morning- she spent approx 5 min in bathroom washing legs off, we then applied hand sanitizer to both legs to dry them further for taping.  Seated quad sets x 20 both Supine quad sets x 20 both Supine TKE's over white foam roller x 20 each LE Supine SAQ's x 20 each LE (black wedge under knees) Seated LAQ  x 20 with 0 lbs  03/22/23 Nustep: Level 4 x 6 minutes- PT present to discuss progress - Lengthy discussion regarding anatomy of  the lumbar spine and pelvis and how her LE issues are likely contributing to her low back pain  Kinesio tape for patellar stability: both Seated LAQ  x 20 with 0 lbs Quad progression: Supine quad sets, Supine TKE with half roll,  Supine TKE with blue foam roller with 0 lbs (attempted SAQ's with isometric hip adduction but patient continued to have pain): all with kinesio tape 15-20 min spent educating patient on anatomy of the knee and how degenerative changes occur.  Pointed out to patient that, on her left knee, she has severe external tibial torsion and almost slight IR of the  femur.  Visual examples shown on computer.   03/08/23 Nustep: Level 4 x 6 minutes- PT present to discuss progress  Seated hamstring stretch 3x20 seconds  Seated LAQ  x 20 with 3 lbs Quad progression: Seated LAQ x 20 with 3 lbs, Supine quad sets, Supine TKE with half roll,  Supine TKE with blue foam roller with 3 lbs Supine TA activation with ball squeeze: 5" hold 2x10 Supine hip abduction with yellow band 2x10 McConnel tape for medial patellar glide left knee- had patient walk for approx 30 feet to assess response.   Recert assessment completed    HOME EXERCISE PROGRAM: Access Code: [email or web address] URL: [email or web address] Date: 03/08/2023 Prepared by: Mikey Kirschner  Exercises - Supine Pelvic Floor Contraction  - 3 x daily - 7 x weekly - 1 sets - 5 reps - 10 hold - Quick Flick Pelvic Floor Contractions in Hooklying  - 3 x daily - 7 x weekly - 1 sets - 10 reps - Seated March  - 1 x daily - 7 x weekly - 3 sets - 10 reps - Seated Hip Adduction Isometrics with Ball  - 1 x daily - 7 x weekly - 3 sets - 10 reps - Seated Hip Abduction with Resistance  - 1 x daily - 7 x weekly - 3 sets - 10 reps - Seated Single Shoulder PNF D2 with Resistance  - 1 x daily - 7 x weekly - 1 sets - 10 reps - Seated pull down (pull down and out with your exhale)  - 1 x daily - 7 x weekly - 2 sets - 10 reps - Standing Hamstring Stretch on Chair  - 1 x daily - 7 x weekly - 1 sets - 3 reps - 20 sec hold - Quadricep Stretch with Chair and Counter Support  - 1 x daily - 7 x weekly - 1 sets - 3 reps - 20 sec hold - Sit to Stand Without Arm Support  - 1 x daily - 7 x weekly - 2 sets - 10 reps - Side Stepping with Resistance at Ankles  - 1 x daily - 7 x weekly - 3 sets - 10 reps - Seated Long Arc Quad  - 1 x daily - 7 x weekly - 3 sets - 10 reps - Supine Quadricep Sets  - 1 x daily - 7 x weekly - 3 sets - 10 reps - Supine Quad Set  - 1 x daily - 7 x weekly - 3 sets - 10 reps   ASSESSMENT:   CLINICAL  IMPRESSION: Donyea was able to do more quad activity with less pain after taping.  She was also able to do sit to stand with minimal pain.   Patient will benefit from skilled PT to address the below impairments and improve overall function.      OBJECTIVE IMPAIRMENTS decreased activity tolerance, decreased  coordination, decreased endurance, decreased strength, increased fascial restrictions, increased muscle spasms, postural dysfunction, and pain.    ACTIVITY LIMITATIONS continence, housework, cooking   PARTICIPATION LIMITATIONS: interpersonal relationship and community activity   PERSONAL FACTORS 1 comorbidity: c-section  are also affecting patient's functional outcome.    REHAB POTENTIAL: Good   CLINICAL DECISION MAKING: Stable/uncomplicated   EVALUATION COMPLEXITY: Low     GOALS: Goals reviewed with patient? Yes   SHORT TERM GOALS:  Low back 02/17/23   Pt will be independent with HEP for low back and knee.    Baseline: Goal status: In progress    LONG TERM GOALS: 03/10/23   Pt will be independent with advanced HEP for LBP.    Baseline:  Goal status: IN PROGRESS   2.  stand for cooking with > or = 30% reduction in LBP   Baseline:  5/10-6/10 Goal status: NEW   3.  Improve ODI to < or = to 20% disability     Baseline: 38%  Goal status: NEW   4.  report 25% reduction in LE pain with standing tasks    Baseline:  Goal status: NEW     PLAN: PT FREQUENCY: 1x/week   PT DURATION: additional 8 weeks (Recert POC 03/08/23 through 05/03/2023)   PLANNED INTERVENTIONS: Therapeutic exercises, Therapeutic activity, Neuromuscular re-education, Balance training, Gait training, Patient/Family education, Joint mobilization, Dry Needling, Biofeedback, and Manual therapy   PLAN FOR NEXT SESSION:  Continue Quad rehab, taping for patellar stability, Left quad is extremely weak. LE strength and focus on proximal hip strength and core strength.   Victorino Dike B. Jemel Ono, PT 03/29/23 10:25  AM  [facility] [address], [address] [place], [place] [address] Phone # [phone] Fax [phone]

## 2023-03-30 NOTE — Telephone Encounter (Signed)
Patient Advocate Encounter  Prior Authorization for Emgality 120MG /ML auto-injectors (migraine) has been approved.    PA# [id number] Insurance [employer] Electronic Prior Authorization Form Effective dates: 03/27/2023 through 06/27/2023

## 2023-04-05 ENCOUNTER — Ambulatory Visit: Payer: Medicaid Other

## 2023-04-05 ENCOUNTER — Telehealth: Payer: Self-pay | Admitting: Psychiatry

## 2023-04-05 DIAGNOSIS — M6281 Muscle weakness (generalized): Secondary | ICD-10-CM | POA: Diagnosis not present

## 2023-04-05 DIAGNOSIS — R262 Difficulty in walking, not elsewhere classified: Secondary | ICD-10-CM

## 2023-04-05 DIAGNOSIS — G8929 Other chronic pain: Secondary | ICD-10-CM

## 2023-04-05 DIAGNOSIS — R293 Abnormal posture: Secondary | ICD-10-CM

## 2023-04-05 DIAGNOSIS — R252 Cramp and spasm: Secondary | ICD-10-CM

## 2023-04-05 DIAGNOSIS — M79604 Pain in right leg: Secondary | ICD-10-CM

## 2023-04-05 DIAGNOSIS — M170 Bilateral primary osteoarthritis of knee: Secondary | ICD-10-CM

## 2023-04-05 DIAGNOSIS — M62838 Other muscle spasm: Secondary | ICD-10-CM

## 2023-04-05 NOTE — Therapy (Signed)
OUTPATIENT PHYSICAL THERAPY RECERT NOTE   Patient Name: Linda Cunningham MRN: [id number] DOB:[birth year], 57 y.o., female Today's Date: 04/05/2023  PCP: Macy Mis, MD REFERRING PROVIDER: Macy Mis, MD  END OF SESSION:   PT End of Session - 04/05/23 0934     Visit Number 18    Date for PT Re-Evaluation 05/03/23    Authorization Type [employer] Medicaid    Authorization Time Period 27 visit limit    Authorization - Visit Number 18    Authorization - Number of Visits 27    Progress Note Due on Visit 20    PT Start Time 0930    PT Stop Time 1028    PT Time Calculation (min) 58 min    Activity Tolerance Patient limited by pain    Behavior During Therapy [facility] for tasks assessed/performed                     Past Medical History:  Diagnosis Date   Abnormal nuclear stress test 09/25/2015   Chest pain 01/05/2015   Epigastric pain    Essential hypertension, benign 12/11/2013   Hypertension    Migraine    OSA on CPAP    Followed by Dr. Catalina Lunger with [facility]   Pain of upper extremity 01/07/2020   Pseudotumor cerebri    SOB (shortness of breath) 09/16/2016   Past Surgical History:  Procedure Laterality Date   CARDIAC CATHETERIZATION N/A 09/25/2015   Procedure: Left Heart Cath and Coronary Angiography;  Surgeon: Peter M Swazil[place], MD;  Location: [facility] INVASIVE CV LAB;  Service: Cardiovascular;  Laterality: N/A;   CESAREAN SECTION     Patient Active Problem List   Diagnosis Date Noted   Pain of upper extremity 01/07/2020   SOB (shortness of breath) 09/16/2016   OSA on CPAP    Abnormal nuclear stress test 09/25/2015   Chest pain 01/05/2015   Essential hypertension, benign 12/11/2013    REFERRING DIAG: N39.41 (ICD-10-CM) - Urge incontinence  THERAPY DIAG:  Difficulty in walking, not elsewhere classified  Abnormal posture  Cramp and spasm  Muscle weakness (generalized)  Chronic pain of left knee  Bilateral primary osteoarthritis of knee  Pain in right  leg  Other muscle spasm  Rationale for Evaluation and Treatment Rehabilitation  PERTINENT HISTORY: One c-section, bil knee pain  PRECAUTIONS: NA  SUBJECTIVE:                                                                                                                                                                                      SUBJECTIVE STATEMENT: Patient reports the tape seems to help but it just doesn't  stay on long.  She continues to bilateral knee pain R>L.  She reports this pain at 6/10 today.    PAIN:  04/05/23: Are you having pain? Yes: NPRS scale: 6/10 Pain location: low back Pain description: tight Aggravating factors: worse in morning, standing Relieving factors: lying down  09/27/22 SUBJECTIVE STATEMENT:  Pt states that she is still having difficulty with urinary urgency and incontinence. She is having difficulty with frequency at night too, but she is trying not to get up due to it being cold. Having a very hard time with urge suppression technique and making it to he bathroom - specifically with cooking and housework.       Fluid intake: Yes:    PRECAUTIONS: None   WEIGHT BEARING RESTRICTIONS No   FALLS:  Has patient fallen in last 6 months? No   LIVING ENVIRONMENT: Lives with: lives with their family Lives in: House/apartment   OCCUPATION: not currently employed   PLOF: Independent   PATIENT GOALS decrease urinary leaking   PERTINENT HISTORY:  One c-section, bil knee pain Sexual abuse: No   PREGNANCY Vaginal deliveries 0 Tearing No C-section deliveries 1 Currently pregnant No    OBJECTIVE:    PATIENT SURVEYS:   PFIQ-7 - 67 01/24/23 ODI: 19/50, 38% disability 03/08/23: ODI: 10/50, 20% disability   COGNITION:            Overall cognitive status: Within functional limits for tasks assessed                          SENSATION:            Light touch: Appears intact            Proprioception: Appears intact   GAIT:   Comments:  antalgic gait pattern, SPC Rt LE    03/08/23: continued antalgic gait with right LE ER, short step length using SPC in right UE,  step to gait on steps            MMT LE : Generally 4-/5 with exception of left quad which is 3+/5,  bilateral     POSTURE: rounded shoulders and forward head    PALPATION:   General  No abdominal tenderness                 External Perineal Exam WNL                             Internal Pelvic Floor No tenderness in pelvic floor   Patient confirms identification and approves PT to assess internal pelvic floor and treatment Yes   PELVIC MMT: Pelvic floor strength 3/5 Endurance 4 seconds Repeat contractions 13x - poor motor control  PROLAPSE: Grade 1 anterior vaignal wall laxity   TODAY'S TREATMENT 04/05/23 Nustep: Level 4 x 5 minutes- PT present to discuss progress  McConnel tape for patellar stability: both (medial glide) Seated quad sets x 20 both Supine quad sets x 20 both  Seated LAQ  x 20 with 0 lbs Seated LAQ with adductor squeeze on ball x 10 each LE Supine TKE's over white foam roller x 20 each LE Supine SAQ's x 20 each LE (black wedge under knees) Ice to bilateral knees in supine x 10 min  03/29/23 Nustep: Level 4 x 5 minutes- PT present to discuss progress - Kinesio tape for patellar stability: both (patient had forgotten to avoid applying lotion this morning-  she spent approx 5 min in bathroom washing legs off, we then applied hand sanitizer to both legs to dry them further for taping.  Seated quad sets x 20 both Supine quad sets x 20 both Supine TKE's over white foam roller x 20 each LE Supine SAQ's x 20 each LE (black wedge under knees) Seated LAQ  x 20 with 0 lbs  03/22/23 Nustep: Level 4 x 6 minutes- PT present to discuss progress - Lengthy discussion regarding anatomy of the lumbar spine and pelvis and how her LE issues are likely contributing to her low back pain  Kinesio tape for patellar stability: both Seated LAQ  x 20 with  0 lbs Quad progression: Supine quad sets, Supine TKE with half roll,  Supine TKE with blue foam roller with 0 lbs (attempted SAQ's with isometric hip adduction but patient continued to have pain): all with kinesio tape 15-20 min spent educating patient on anatomy of the knee and how degenerative changes occur.  Pointed out to patient that, on her left knee, she has severe external tibial torsion and almost slight IR of the femur.  Visual examples shown on computer.   HOME EXERCISE PROGRAM: Access Code: [email or web address] URL: [email or web address] Date: 03/08/2023 Prepared by: Mikey Kirschner  Exercises - Supine Pelvic Floor Contraction  - 3 x daily - 7 x weekly - 1 sets - 5 reps - 10 hold - Quick Flick Pelvic Floor Contractions in Hooklying  - 3 x daily - 7 x weekly - 1 sets - 10 reps - Seated March  - 1 x daily - 7 x weekly - 3 sets - 10 reps - Seated Hip Adduction Isometrics with Ball  - 1 x daily - 7 x weekly - 3 sets - 10 reps - Seated Hip Abduction with Resistance  - 1 x daily - 7 x weekly - 3 sets - 10 reps - Seated Single Shoulder PNF D2 with Resistance  - 1 x daily - 7 x weekly - 1 sets - 10 reps - Seated pull down (pull down and out with your exhale)  - 1 x daily - 7 x weekly - 2 sets - 10 reps - Standing Hamstring Stretch on Chair  - 1 x daily - 7 x weekly - 1 sets - 3 reps - 20 sec hold - Quadricep Stretch with Chair and Counter Support  - 1 x daily - 7 x weekly - 1 sets - 3 reps - 20 sec hold - Sit to Stand Without Arm Support  - 1 x daily - 7 x weekly - 2 sets - 10 reps - Side Stepping with Resistance at Ankles  - 1 x daily - 7 x weekly - 3 sets - 10 reps - Seated Long Arc Quad  - 1 x daily - 7 x weekly - 3 sets - 10 reps - Supine Quadricep Sets  - 1 x daily - 7 x weekly - 3 sets - 10 reps - Supine Quad Set  - 1 x daily - 7 x weekly - 3 sets - 10 reps   ASSESSMENT:   CLINICAL IMPRESSION: Iyah was able to do more quad activity with less pain after taping. She continues  to have pain at end range but is able to achieve full range.  We discussed possibly talking with MD about patellar stabilizing brace.  We printed out pictures of a couple of options for bracing.  We also discussed an appropriate time for knee replacement and factors to consider.  We discussed her antalgic gait and how this could gradually cause other ortho issues in the kinetic chain. She would benefit from continued skilled PT for quad rehab, gait training, and LE flexibility along with alignment training.       OBJECTIVE IMPAIRMENTS decreased activity tolerance, decreased coordination, decreased endurance, decreased strength, increased fascial restrictions, increased muscle spasms, postural dysfunction, and pain.    ACTIVITY LIMITATIONS continence, housework, cooking   PARTICIPATION LIMITATIONS: interpersonal relationship and community activity   PERSONAL FACTORS 1 comorbidity: c-section  are also affecting patient's functional outcome.    REHAB POTENTIAL: Good   CLINICAL DECISION MAKING: Stable/uncomplicated   EVALUATION COMPLEXITY: Low     GOALS: Goals reviewed with patient? Yes   SHORT TERM GOALS:  Low back 02/17/23   Pt will be independent with HEP for low back and knee.    Baseline: Goal status: In progress    LONG TERM GOALS: 03/10/23   Pt will be independent with advanced HEP for LBP.    Baseline:  Goal status: IN PROGRESS   2.  stand for cooking with > or = 30% reduction in LBP   Baseline:  5/10-6/10 Goal status: NEW   3.  Improve ODI to < or = to 20% disability     Baseline: 38%  Goal status: NEW   4.  report 25% reduction in LE pain with standing tasks    Baseline:  Goal status: NEW     PLAN: PT FREQUENCY: 1x/week   PT DURATION: additional 8 weeks (Recert POC 03/08/23 through 05/03/2023)   PLANNED INTERVENTIONS: Therapeutic exercises, Therapeutic activity, Neuromuscular re-education, Balance training, Gait training, Patient/Family education, Joint  mobilization, Dry Needling, Biofeedback, and Manual therapy   PLAN FOR NEXT SESSION:  Continue Quad rehab, taping for patellar stability, Left quad is extremely weak. LE strength and focus on proximal hip strength and core strength.   Victorino Dike B. Shareka Casale, PT 04/05/23 10:38 AM  [facility] [address], [address] [place], [place] [address] Phone # [phone] Fax [phone]

## 2023-04-05 NOTE — Telephone Encounter (Signed)
Called pt back. Advised per last OV note from Dr. Delena Bali, she wanted her to start on Emgality. Advised formulary lists can change yr to yr for insurance and this may have been what happened. She has not picked up yet. Advised she will need to make sure to pick up 2 pens (loading dose) first and then go to 1 pen monthly thereafter. She verbalized understanding.

## 2023-04-05 NOTE — Telephone Encounter (Signed)
Pt wants to know why the Aimovig was never approved. Stated she has been taking it a very long time. Stated I don't understand how the Emgality was approved and not the Aimovig.

## 2023-04-12 ENCOUNTER — Ambulatory Visit: Payer: Medicaid Other | Attending: Family Medicine

## 2023-04-12 DIAGNOSIS — M170 Bilateral primary osteoarthritis of knee: Secondary | ICD-10-CM | POA: Insufficient documentation

## 2023-04-12 DIAGNOSIS — M79604 Pain in right leg: Secondary | ICD-10-CM | POA: Diagnosis present

## 2023-04-12 DIAGNOSIS — R252 Cramp and spasm: Secondary | ICD-10-CM | POA: Diagnosis present

## 2023-04-12 DIAGNOSIS — M25562 Pain in left knee: Secondary | ICD-10-CM | POA: Insufficient documentation

## 2023-04-12 DIAGNOSIS — M6281 Muscle weakness (generalized): Secondary | ICD-10-CM | POA: Diagnosis present

## 2023-04-12 DIAGNOSIS — R262 Difficulty in walking, not elsewhere classified: Secondary | ICD-10-CM | POA: Insufficient documentation

## 2023-04-12 DIAGNOSIS — R293 Abnormal posture: Secondary | ICD-10-CM | POA: Diagnosis present

## 2023-04-12 DIAGNOSIS — G8929 Other chronic pain: Secondary | ICD-10-CM | POA: Insufficient documentation

## 2023-04-12 DIAGNOSIS — M5459 Other low back pain: Secondary | ICD-10-CM | POA: Diagnosis present

## 2023-04-12 NOTE — Therapy (Signed)
OUTPATIENT PHYSICAL THERAPY RECERT NOTE   Patient Name: Linda Cunningham MRN: [id number] DOB:[birth year], 57 y.o., female Today's Date: 04/12/2023  PCP: Macy Mis, MD REFERRING PROVIDER: Macy Mis, MD  END OF SESSION:   PT End of Session - 04/12/23 1003     Visit Number 19    Date for PT Re-Evaluation 05/03/23    Authorization Type [employer] Medicaid    Authorization Time Period 27 visit limit    Authorization - Visit Number 19    Authorization - Number of Visits 27    Progress Note Due on Visit 20    PT Start Time 0935    PT Stop Time 1020    PT Time Calculation (min) 45 min    Activity Tolerance Patient limited by pain    Behavior During Therapy [facility] for tasks assessed/performed                     Past Medical History:  Diagnosis Date   Abnormal nuclear stress test 09/25/2015   Chest pain 01/05/2015   Epigastric pain    Essential hypertension, benign 12/11/2013   Hypertension    Migraine    OSA on CPAP    Followed by Dr. Catalina Lunger with [facility]   Pain of upper extremity 01/07/2020   Pseudotumor cerebri    SOB (shortness of breath) 09/16/2016   Past Surgical History:  Procedure Laterality Date   CARDIAC CATHETERIZATION N/A 09/25/2015   Procedure: Left Heart Cath and Coronary Angiography;  Surgeon: Peter M Swaziland, MD;  Location: [facility] INVASIVE CV LAB;  Service: Cardiovascular;  Laterality: N/A;   CESAREAN SECTION     Patient Active Problem List   Diagnosis Date Noted   Pain of upper extremity 01/07/2020   SOB (shortness of breath) 09/16/2016   OSA on CPAP    Abnormal nuclear stress test 09/25/2015   Chest pain 01/05/2015   Essential hypertension, benign 12/11/2013    REFERRING DIAG: N39.41 (ICD-10-CM) - Urge incontinence  THERAPY DIAG:  Difficulty in walking, not elsewhere classified  Abnormal posture  Cramp and spasm  Muscle weakness (generalized)  Chronic pain of left knee  Bilateral primary osteoarthritis of knee  Pain in right  leg  Rationale for Evaluation and Treatment Rehabilitation  PERTINENT HISTORY: One c-section, bil knee pain  PRECAUTIONS: NA  SUBJECTIVE:                                                                                                                                                                                      SUBJECTIVE STATEMENT: Patient reports the tape didn't stay on for more than 5-6 hours after last visit.  Pain is about the same.  We discuss her visit limit for insurance.    PAIN:  04/12/23: Are you having pain? Yes: NPRS scale: 6/10 Pain location: low back Pain description: tight Aggravating factors: worse in morning, standing Relieving factors: lying down  09/27/22 SUBJECTIVE STATEMENT:  Pt states that she is still having difficulty with urinary urgency and incontinence. She is having difficulty with frequency at night too, but she is trying not to get up due to it being cold. Having a very hard time with urge suppression technique and making it to he bathroom - specifically with cooking and housework.       Fluid intake: Yes:    PRECAUTIONS: None   WEIGHT BEARING RESTRICTIONS No   FALLS:  Has patient fallen in last 6 months? No   LIVING ENVIRONMENT: Lives with: lives with their family Lives in: House/apartment   OCCUPATION: not currently employed   PLOF: Independent   PATIENT GOALS decrease urinary leaking   PERTINENT HISTORY:  One c-section, bil knee pain Sexual abuse: No   PREGNANCY Vaginal deliveries 0 Tearing No C-section deliveries 1 Currently pregnant No    OBJECTIVE:    PATIENT SURVEYS:   PFIQ-7 - 67 01/24/23 ODI: 19/50, 38% disability 03/08/23: ODI: 10/50, 20% disability   COGNITION:            Overall cognitive status: Within functional limits for tasks assessed                          SENSATION:            Light touch: Appears intact            Proprioception: Appears intact   GAIT:   Comments: antalgic gait pattern, SPC Rt  LE    03/08/23: continued antalgic gait with right LE ER, short step length using SPC in right UE,  step to gait on steps            MMT LE : Generally 4-/5 with exception of left quad which is 3+/5,  bilateral     POSTURE: rounded shoulders and forward head    PALPATION:   General  No abdominal tenderness                 External Perineal Exam WNL                             Internal Pelvic Floor No tenderness in pelvic floor   Patient confirms identification and approves PT to assess internal pelvic floor and treatment Yes   PELVIC MMT: Pelvic floor strength 3/5 Endurance 4 seconds Repeat contractions 13x - poor motor control  PROLAPSE: Grade 1 anterior vaignal wall laxity   TODAY'S TREATMENT 04/12/23 Nustep: Level 4 x 5 minutes- PT present to discuss progress  Supine quad sets x 20 both  Supine TKE with blue noodle x 20 both Supine TKE's over white foam roller x 20 each LE Supine SAQ's x 20 each LE (black wedge under knees) Seated LAQ  x 20 with 0 lbs both Seated LAQ x 20 each LE Lengthy discussion and education on self care at home and how important HEP is in order to better assess whether she is at a point of needing TKA.  Reviewed HEP and discussed visit limit and possibility of finishing up next visit and letting her continue independently due to her visit limit end coming  up and only halfway through the year.    04/05/23 Nustep: Level 4 x 5 minutes- PT present to discuss progress  McConnel tape for patellar stability: both (medial glide) Seated quad sets x 20 both Supine quad sets x 20 both  Seated LAQ  x 20 with 0 lbs Seated LAQ with adductor squeeze on ball x 10 each LE Supine TKE's over white foam roller x 20 each LE Supine SAQ's x 20 each LE (black wedge under knees) Ice to bilateral knees in supine x 10 min  03/29/23 Nustep: Level 4 x 5 minutes- PT present to discuss progress - Kinesio tape for patellar stability: both (patient had forgotten to avoid applying  lotion this morning- she spent approx 5 min in bathroom washing legs off, we then applied hand sanitizer to both legs to dry them further for taping.  Seated quad sets x 20 both Supine quad sets x 20 both Supine TKE's over white foam roller x 20 each LE Supine SAQ's x 20 each LE (black wedge under knees) Seated LAQ  x 20 with 0 lbs  HOME EXERCISE PROGRAM: Access Code: [email or web address] URL: [email or web address] Date: 04/12/2023 Prepared by: Mikey Kirschner  Exercises - Supine Pelvic Floor Contraction  - 3 x daily - 7 x weekly - 1 sets - 5 reps - 10 hold - Quick Flick Pelvic Floor Contractions in Hooklying  - 3 x daily - 7 x weekly - 1 sets - 10 reps - Seated March  - 1 x daily - 7 x weekly - 3 sets - 10 reps - Seated Hip Adduction Isometrics with Ball  - 1 x daily - 7 x weekly - 3 sets - 10 reps - Seated Hip Abduction with Resistance  - 1 x daily - 7 x weekly - 3 sets - 10 reps - Seated Single Shoulder PNF D2 with Resistance  - 1 x daily - 7 x weekly - 1 sets - 10 reps - Seated pull down (pull down and out with your exhale)  - 1 x daily - 7 x weekly - 2 sets - 10 reps - Standing Hamstring Stretch on Chair  - 1 x daily - 7 x weekly - 1 sets - 3 reps - 20 sec hold - Quadricep Stretch with Chair and Counter Support  - 1 x daily - 7 x weekly - 1 sets - 3 reps - 20 sec hold - Sit to Stand Without Arm Support  - 1 x daily - 7 x weekly - 2 sets - 10 reps - Side Stepping with Resistance at Ankles  - 1 x daily - 7 x weekly - 3 sets - 10 reps - Seated Long Arc Quad  - 1 x daily - 7 x weekly - 3 sets - 10 reps - Supine Quadricep Sets  - 1 x daily - 7 x weekly - 3 sets - 10 reps - Supine Quad Set  - 1 x daily - 7 x weekly - 3 sets - 10 reps - Supine Knee Extension Strengthening  - 1 x daily - 7 x weekly - 2 sets - 10 reps - Small Range Straight Leg Raise  - 1 x daily - 7 x weekly - 2 sets - 10 reps  ASSESSMENT:   CLINICAL IMPRESSION: Jahmia will be seen for her 20th visit next session and  is making very slow progress.  She has not been compliant with her HEP.  She states she was not sure she could locate the papers.  We discussed that not following through and only doing the exercises when she is here and with the limited number of visits, she would likely not benefit from the PT.  She was actually able to do quad progression today with very little c/o pain and minimal palpable crepitus.  She would likely be doing very well if she was more diligent with her HEP.  We have tried taping multiple times but the tape does not adhere to her skin well and comes off within a few hours.  We had a lengthy discussion about her visits running out and that she would likely need to work on her own for a few weeks being very committed to her HEP to see if she is able to improve.  This would leave her with 7 visits for the remainder of the year.  She agrees but also asks about the pool and re-printing her exercises.  We discussed being consistent with the HEP first before we switch to something different and also encouraged her to see if she can locate her program that was provided a few visits ago before we re-print all of those again.  We will continue 1 more visit to advise on DC plan and re-assess for DC.      OBJECTIVE IMPAIRMENTS decreased activity tolerance, decreased coordination, decreased endurance, decreased strength, increased fascial restrictions, increased muscle spasms, postural dysfunction, and pain.    ACTIVITY LIMITATIONS continence, housework, cooking   PARTICIPATION LIMITATIONS: interpersonal relationship and community activity   PERSONAL FACTORS 1 comorbidity: c-section  are also affecting patient's functional outcome.    REHAB POTENTIAL: Good   CLINICAL DECISION MAKING: Stable/uncomplicated   EVALUATION COMPLEXITY: Low     GOALS: Goals reviewed with patient? Yes   SHORT TERM GOALS:  Low back 02/17/23   Pt will be independent with HEP for low back and knee.     Baseline: Goal status: In progress - patient lost her papers 04/12/23   LONG TERM GOALS: 03/10/23   Pt will be independent with advanced HEP for LBP.   Baseline:  Goal status: IN PROGRESS- patient lost papers 04/12/23   2.  stand for cooking with > or = 30% reduction in LBP  Baseline:  5/10-6/10 Goal status: In progress   3.  Improve ODI to < or = to 20% disability    Baseline: 38%  Goal status: In progress   4.  report 25% reduction in LE pain with standing tasks   Baseline:  Goal status: In progress     PLAN: PT FREQUENCY: 1x/week   PT DURATION: additional 8 weeks (Recert POC 03/08/23 through 05/03/2023)   PLANNED INTERVENTIONS: Therapeutic exercises, Therapeutic activity, Neuromuscular re-education, Balance training, Gait training, Patient/Family education, Joint mobilization, Dry Needling, Biofeedback, and Manual therapy   PLAN FOR NEXT SESSION:  Continue Quad rehab, brace suggested for patellar stability, Left quad focused strengthening along with right quad rehab. LE strength and focus on proximal hip strength and core strength.   Victorino Dike B. Psalms [place]arte, PT 04/12/23 12:18 PM  [facility] [address], [address] [place], [place] [address] Phone # [phone] Fax [phone]

## 2023-04-13 ENCOUNTER — Ambulatory Visit: Payer: Medicaid Other | Admitting: Family Medicine

## 2023-04-19 ENCOUNTER — Ambulatory Visit: Payer: Medicaid Other

## 2023-04-19 DIAGNOSIS — M170 Bilateral primary osteoarthritis of knee: Secondary | ICD-10-CM

## 2023-04-19 DIAGNOSIS — G8929 Other chronic pain: Secondary | ICD-10-CM

## 2023-04-19 DIAGNOSIS — M6281 Muscle weakness (generalized): Secondary | ICD-10-CM

## 2023-04-19 DIAGNOSIS — R293 Abnormal posture: Secondary | ICD-10-CM

## 2023-04-19 DIAGNOSIS — R262 Difficulty in walking, not elsewhere classified: Secondary | ICD-10-CM

## 2023-04-19 DIAGNOSIS — M5459 Other low back pain: Secondary | ICD-10-CM

## 2023-04-19 DIAGNOSIS — M79604 Pain in right leg: Secondary | ICD-10-CM

## 2023-04-19 DIAGNOSIS — R252 Cramp and spasm: Secondary | ICD-10-CM

## 2023-04-19 NOTE — Therapy (Signed)
OUTPATIENT PHYSICAL THERAPY DISCHARGE NOTE PHYSICAL THERAPY DISCHARGE SUMMARY  Visits from Start of Care: 20  Current functional level related to goals / functional outcomes: See below   Remaining deficits: See below   Education / Equipment: See below   Patient agrees to discharge. Patient goals were partially met. Patient is being discharged due to  insurance visit limits.    Patient Name: Linda Cunningham MRN: [id number] DOB:1966/04/13, 57 y.o., female Today's Date: 04/19/2023  PCP: Macy Mis, MD REFERRING PROVIDER: Macy Mis, MD  END OF SESSION:   PT End of Session - 04/19/23 0955     Visit Number 20    Date for PT Re-Evaluation 05/03/23    Authorization Type [employer] Medicaid    Authorization Time Period 27 visit limit    PT Start Time 0945    PT Stop Time 1015    PT Time Calculation (min) 30 min                     Past Medical History:  Diagnosis Date   Abnormal nuclear stress test 09/25/2015   Chest pain 01/05/2015   Epigastric pain    Essential hypertension, benign 12/11/2013   Hypertension    Migraine    OSA on CPAP    Followed by Dr. Catalina Lunger with [facility]   Pain of upper extremity 01/07/2020   Pseudotumor cerebri    SOB (shortness of breath) 09/16/2016   Past Surgical History:  Procedure Laterality Date   CARDIAC CATHETERIZATION N/A 09/25/2015   Procedure: Left Heart Cath and Coronary Angiography;  Surgeon: Peter M Swaziland, MD;  Location: [facility] INVASIVE CV LAB;  Service: Cardiovascular;  Laterality: N/A;   CESAREAN SECTION     Patient Active Problem List   Diagnosis Date Noted   Pain of upper extremity 01/07/2020   SOB (shortness of breath) 09/16/2016   OSA on CPAP    Abnormal nuclear stress test 09/25/2015   Chest pain 01/05/2015   Essential hypertension, benign 12/11/2013    REFERRING DIAG: N39.41 (ICD-10-CM) - Urge incontinence,  bilateral knee pain  THERAPY DIAG:  Difficulty in walking, not elsewhere  classified  Abnormal posture  Cramp and spasm  Muscle weakness (generalized)  Chronic pain of left knee  Bilateral primary osteoarthritis of knee  Pain in right leg  Other low back pain  Rationale for Evaluation and Treatment Rehabilitation  PERTINENT HISTORY: One c-section, bil knee pain  PRECAUTIONS: NA  SUBJECTIVE:  SUBJECTIVE STATEMENT: Patient reports she is doing ok.  No issues since last visit.   PAIN:  04/19/23: Are you having pain? Yes: NPRS scale: 6/10 Pain location: low back, knees Pain description: tight Aggravating factors: worse in morning, standing Relieving factors: lying down  09/27/22 SUBJECTIVE STATEMENT:  Pt states that she is still having difficulty with urinary urgency and incontinence. She is having difficulty with frequency at night too, but she is trying not to get up due to it being cold. Having a very hard time with urge suppression technique and making it to he bathroom - specifically with cooking and housework.       Fluid intake: Yes:    PRECAUTIONS: None   WEIGHT BEARING RESTRICTIONS No   FALLS:  Has patient fallen in last 6 months? No   LIVING ENVIRONMENT: Lives with: lives with their family Lives in: House/apartment   OCCUPATION: not currently employed   PLOF: Independent   PATIENT GOALS decrease urinary leaking   PERTINENT HISTORY:  One c-section, bil knee pain Sexual abuse: No   PREGNANCY Vaginal deliveries 0 Tearing No C-section deliveries 1 Currently pregnant No    OBJECTIVE:    PATIENT SURVEYS:   PFIQ-7 - 67 01/24/23 ODI: 19/50, 38% disability 03/08/23: ODI: 10/50, 20% disability   COGNITION:            Overall cognitive status: Within functional limits for tasks assessed                          SENSATION:            Light  touch: Appears intact            Proprioception: Appears intact   GAIT:   Comments: antalgic gait pattern, SPC Rt LE    03/08/23: continued antalgic gait with right LE ER, short step length using SPC in right UE,  step to gait on steps  04/19/23: gait normalizing with good heel to toe progression             MMT LE : 04/19/23 Generally 4/5 with exception of : right quad 4/5, right hamstring 4-/5, left quad 4-/5 left hamstring 4-/5    POSTURE: rounded shoulders and forward head    PALPATION:   General  No abdominal tenderness                 External Perineal Exam WNL                             Internal Pelvic Floor No tenderness in pelvic floor   Patient confirms identification and approves PT to assess internal pelvic floor and treatment Yes   PELVIC MMT: Pelvic floor strength 3/5 Endurance 4 seconds Repeat contractions 13x - poor motor control  PROLAPSE: Grade 1 anterior vaignal wall laxity   TODAY'S TREATMENT 04/12/23 DC assessment completed Reviewed HEP and provided DC plan  04/12/23 Nustep: Level 4 x 5 minutes- PT present to discuss progress  Supine quad sets x 20 both  Supine TKE with blue noodle x 20 both Supine TKE's over white foam roller x 20 each LE Supine SAQ's x 20 each LE (black wedge under knees) Seated LAQ  x 20 with 0 lbs both Seated LAQ x 20 each LE Lengthy discussion and education on self care at home and how important HEP is in order to better assess whether she is at a point  of needing TKA.  Reviewed HEP and discussed visit limit and possibility of finishing up next visit and letting her continue independently due to her visit limit end coming up and only halfway through the year.    04/05/23 Nustep: Level 4 x 5 minutes- PT present to discuss progress  McConnel tape for patellar stability: both (medial glide) Seated quad sets x 20 both Supine quad sets x 20 both  Seated LAQ  x 20 with 0 lbs Seated LAQ with adductor squeeze on ball x 10 each  LE Supine TKE's over white foam roller x 20 each LE Supine SAQ's x 20 each LE (black wedge under knees) Ice to bilateral knees in supine x 10 min  03/29/23 Nustep: Level 4 x 5 minutes- PT present to discuss progress - Kinesio tape for patellar stability: both (patient had forgotten to avoid applying lotion this morning- she spent approx 5 min in bathroom washing legs off, we then applied hand sanitizer to both legs to dry them further for taping.  Seated quad sets x 20 both Supine quad sets x 20 both Supine TKE's over white foam roller x 20 each LE Supine SAQ's x 20 each LE (black wedge under knees) Seated LAQ  x 20 with 0 lbs  HOME EXERCISE PROGRAM: Access Code: [email or web address] URL: [email or web address] Date: 04/12/2023 Prepared by: Mikey Kirschner  Exercises - Supine Pelvic Floor Contraction  - 3 x daily - 7 x weekly - 1 sets - 5 reps - 10 hold - Quick Flick Pelvic Floor Contractions in Hooklying  - 3 x daily - 7 x weekly - 1 sets - 10 reps - Seated March  - 1 x daily - 7 x weekly - 3 sets - 10 reps - Seated Hip Adduction Isometrics with Ball  - 1 x daily - 7 x weekly - 3 sets - 10 reps - Seated Hip Abduction with Resistance  - 1 x daily - 7 x weekly - 3 sets - 10 reps - Seated Single Shoulder PNF D2 with Resistance  - 1 x daily - 7 x weekly - 1 sets - 10 reps - Seated pull down (pull down and out with your exhale)  - 1 x daily - 7 x weekly - 2 sets - 10 reps - Standing Hamstring Stretch on Chair  - 1 x daily - 7 x weekly - 1 sets - 3 reps - 20 sec hold - Quadricep Stretch with Chair and Counter Support  - 1 x daily - 7 x weekly - 1 sets - 3 reps - 20 sec hold - Sit to Stand Without Arm Support  - 1 x daily - 7 x weekly - 2 sets - 10 reps - Side Stepping with Resistance at Ankles  - 1 x daily - 7 x weekly - 3 sets - 10 reps - Seated Long Arc Quad  - 1 x daily - 7 x weekly - 3 sets - 10 reps - Supine Quadricep Sets  - 1 x daily - 7 x weekly - 3 sets - 10 reps - Supine Quad  Set  - 1 x daily - 7 x weekly - 3 sets - 10 reps - Supine Knee Extension Strengthening  - 1 x daily - 7 x weekly - 2 sets - 10 reps - Small Range Straight Leg Raise  - 1 x daily - 7 x weekly - 2 sets - 10 reps  ASSESSMENT:   CLINICAL IMPRESSION: Zelina was able to locate her HEP and brought  this in today for [place] to review and make any modifications.  She hopes to be more compliant and consistent with her HEP.  She was able to complete her HEP today with good form and with minimal pain.  She would benefit from beginning a consistent low level exercise program.  We will DC at this time.        OBJECTIVE IMPAIRMENTS decreased activity tolerance, decreased coordination, decreased endurance, decreased strength, increased fascial restrictions, increased muscle spasms, postural dysfunction, and pain.    ACTIVITY LIMITATIONS continence, housework, cooking   PARTICIPATION LIMITATIONS: interpersonal relationship and community activity   PERSONAL FACTORS 1 comorbidity: c-section  are also affecting patient's functional outcome.    REHAB POTENTIAL: Good   CLINICAL DECISION MAKING: Stable/uncomplicated   EVALUATION COMPLEXITY: Low     GOALS: Goals reviewed with patient? Yes   SHORT TERM GOALS:  Low back 02/17/23   Pt will be independent with HEP for low back and knee.    Baseline: Goal status: In progress - patient lost her papers 04/12/23   LONG TERM GOALS: 03/10/23   Pt will be independent with advanced HEP for LBP.   Baseline:  Goal status: IN PROGRESS- patient lost papers 04/12/23   2.  stand for cooking with > or = 30% reduction in LBP  Baseline:  5/10-6/10 Goal status: In progress   3.  Improve ODI to < or = to 20% disability    Baseline: 38%  Goal status: MET   4.  report 25% reduction in LE pain with standing tasks   Baseline:  Goal status: MET     PLAN: PT FREQUENCY: 1x/week   PT DURATION: additional 8 weeks (Recert POC 03/08/23 through 05/03/2023)   PLANNED INTERVENTIONS:  Therapeutic exercises, Therapeutic activity, Neuromuscular re-education, Balance training, Gait training, Patient/Family education, Joint mobilization, Dry Needling, Biofeedback, and Manual therapy   PLAN FOR NEXT SESSION:  We will DC at this time.    Victorino Dike B. Levaughn Puccinelli, PT 04/19/23 9:45 PM  [facility] [address], [address] [place], [place] [address] Phone # [phone] Fax [phone]

## 2023-04-25 ENCOUNTER — Other Ambulatory Visit: Payer: Self-pay | Admitting: Family Medicine

## 2023-04-25 DIAGNOSIS — Z1231 Encounter for screening mammogram for malignant neoplasm of breast: Secondary | ICD-10-CM

## 2023-06-05 ENCOUNTER — Ambulatory Visit
Admission: RE | Admit: 2023-06-05 | Discharge: 2023-06-05 | Disposition: A | Discharge status: Home / Self Care | Payer: Medicaid Other | Source: Ambulatory Visit | Attending: Family Medicine | Admitting: Family Medicine

## 2023-06-05 DIAGNOSIS — Z1231 Encounter for screening mammogram for malignant neoplasm of breast: Secondary | ICD-10-CM

## 2023-06-06 ENCOUNTER — Other Ambulatory Visit (HOSPITAL_COMMUNITY): Payer: Self-pay

## 2023-06-06 ENCOUNTER — Telehealth: Payer: Self-pay

## 2023-06-06 NOTE — Telephone Encounter (Signed)
I called pt all is yes except pt is experiencing constipation, not sure if related to that, but you can say is experiencing.

## 2023-06-06 NOTE — Telephone Encounter (Signed)
Received a request to renew PA for Emgality maintenance dose-PT has not been evaluated since starting Emgality-Insurance would like to know all of the following questions (I answered yes just to get the rest of the questions to populate-there is no documentation that reflects these answers). Please advise-

## 2023-06-07 NOTE — Telephone Encounter (Signed)
Pharmacy Patient Advocate Encounter   Received notification from CoverMyMeds that prior authorization for Emgality 120MG /ML auto-injectors (migraine) is required/requested.   Insurance verification completed.   The patient is insured through  [facility]  .   Per test claim: PA required; PA submitted to [employer] via CoverMyMeds Key/confirmation #/EOC  [id number] Status is pending

## 2023-06-09 NOTE — Telephone Encounter (Signed)
Pharmacy Patient Advocate Encounter  Received notification from [facility] that Prior Authorization for Emgality 120MG /ML auto-injectors (migraine) has been DENIED. Please advise how you'd like to proceed. Full denial letter will be uploaded to the media tab. See denial reason below.    PA #/Case ID/Reference #: [id number]

## 2023-06-12 NOTE — Telephone Encounter (Signed)
I called and spoke to patient who states she did experience constipation but it was not significant enough for her to want to stop taking the medication as it was helping. Do you want to appeal or try a different medication. I see the loading dose was approved.

## 2023-06-12 NOTE — Telephone Encounter (Signed)
Appeal letter completed and placed in MD office for review and sig.

## 2023-06-12 NOTE — Telephone Encounter (Signed)
Let's try to appeal since she feels the benefits outweigh the side effects. Thanks

## 2023-07-31 ENCOUNTER — Encounter: Payer: Self-pay | Admitting: Psychiatry

## 2023-07-31 ENCOUNTER — Ambulatory Visit: Payer: Medicaid Other | Admitting: Psychiatry

## 2023-07-31 VITALS — BP 132/91 | HR 78 | Ht 59.0 in | Wt 240.5 lb

## 2023-07-31 DIAGNOSIS — G932 Benign intracranial hypertension: Secondary | ICD-10-CM

## 2023-07-31 DIAGNOSIS — G43019 Migraine without aura, intractable, without status migrainosus: Secondary | ICD-10-CM

## 2023-07-31 MED ORDER — AIMOVIG 140 MG/ML ~~LOC~~ SOAJ: 140.0000 mg | SUBCUTANEOUS | 6 refills | Status: DC

## 2023-07-31 MED ORDER — GALCANEZUMAB-GNLM 120 MG/ML ~~LOC~~ SOAJ: 120.0000 mg | Freq: Once | SUBCUTANEOUS | Status: DC

## 2023-07-31 MED ORDER — ZAVZPRET 10 MG/ACT NA SOLN: 10.0000 mg | NASAL | 6 refills | Status: DC | PRN

## 2023-07-31 MED ORDER — ZAVZPRET 10 MG/ACT NA SOLN: 10.0000 mg | NASAL | Status: AC | PRN

## 2023-07-31 NOTE — Progress Notes (Signed)
   CC:  headaches  Follow-up Visit  Last visit: 02/23/23  Brief HPI: 57 year old female with a history of IIH, migraines, OSA, HTN, HLD who follows in clinic for migraines and IIH.   At her last visit she was started on Emgality for migraine prevention. Topamax and baclofen were continued.  Interval History: Insurance stopped [employer], so she has not been able to take it for the past few months. Continues to take Topamax 100 mg at bedtime, which helps but continues to cause paresthesias. She is averaging 5 migraines per month. Baclofen and Tylenol help reduce her headaches but do not fully relieve them.  States she saw ophthalmology in August who did not see any papilledema on her exam.   Headache days per month: 5 Headache free days per month: 25  Current Headache Regimen: Preventative: Emgality 120 mg monthly, Topamax 100 mg QHS Abortive: baclofen 10 mg TID PRN, tylenol    Prior Therapies                                  Prevention: Aimovig 140 mg monthly Emgality 120 mg monthly - constipation Topamax 100 mg QHS Amlodipine 5 mg daily Losartan 100 mg daily    Rescue: Maxalt 10 mg PRN Nurtec - lack of efficacy Ubrelvy - lack of efficacy Baclofen 10 mg TID PRN She has been told to avoid triptans due to HTN per cardiology and to avoid NSAIDs due to kidney function  Physical Exam:   Vital Signs: BP (!) 132/91 (BP Location: Right Arm, Patient Position: Sitting, Cuff Size: Large)   Pulse 78   Ht 4\' 11"  (1.499 m)   Wt 240 lb 8 oz (109.1 kg)   LMP 10/10/2016   BMI 48.58 kg/m  GENERAL:  well appearing, in no acute distress, alert  SKIN:  Color, texture, turgor normal. No rashes or lesions HEAD:  Normocephalic/atraumatic. RESP: normal respiratory effort MSK:  No gross joint deformities.   NEUROLOGICAL: Mental Status: Alert, oriented to person, place and time, Follows commands, and Speech fluent and appropriate. Cranial Nerves: PERRL, face symmetric, no  dysarthria, hearing grossly intact Motor: moves all extremities equally Gait: walks with a cane  IMPRESSION: 57 year old female with a history of IIH, migraines, OSA, HTN, HLD who presents for follow up of migraines and IIH. Emgality was helpful but caused constipation and insurance stopped paying for it. She would like to go back on Aimovig as possible as she found this the most helpful. Will attempt to get insurance approval for Aimovig now that she has failed Emgality. If unsuccessful would consider starting Ajovy. She has been unable to find a successful rescue regimen. Zavzpret sample provided today.  PLAN: -Prevention: Start Aimovig 140 mg monthly for migraine prevention. Continue Topamax 100 mg at bedtime for now -Rescue: Continue baclofen 10 mg TID PRN. Zavzpret sample provided, will send in rx if this is effective -Next steps: consider Ajovy for prevention if insurance will not cover Aimovig, consider switching Topamax to zonisamide, consider Reyvow for rescue if Zavzpret ineffective  Follow-up: 6 months  I spent a total of 32 minutes on the date of the service. Headache education was done. Discussed treatment options including preventive and acute medications. Discussed medication side effects, adverse reactions and drug interactions. Written educational materials and patient instructions outlining all of the above were given.  Ocie Doyne, MD 07/31/23 1:38 PM

## 2023-08-18 ENCOUNTER — Other Ambulatory Visit (HOSPITAL_COMMUNITY): Payer: Self-pay

## 2023-08-30 ENCOUNTER — Other Ambulatory Visit (HOSPITAL_COMMUNITY): Payer: Self-pay

## 2023-08-30 ENCOUNTER — Telehealth: Payer: Self-pay | Admitting: Pharmacy Technician

## 2023-08-30 NOTE — Telephone Encounter (Signed)
Pharmacy Patient Advocate Encounter   Received notification from CoverMyMeds that prior authorization for Zavzpret 10MG /ACT solution is required/requested.   Insurance verification completed.   The patient is insured through [facility] .   Per test claim: PA required; PA submitted to [facility] via CoverMyMeds Key/confirmation #/EOC [facility] Status is pending

## 2023-08-31 ENCOUNTER — Other Ambulatory Visit (HOSPITAL_COMMUNITY): Payer: Self-pay

## 2023-08-31 NOTE — Telephone Encounter (Signed)
Pharmacy Patient Advocate Encounter  Received notification from [facility] MEDICAID that Prior Authorization for Zavzpret 10MG /ACT solution has been APPROVED from 08/30/2023 to 08/29/2024   PA #/Case ID/Reference #: PA Case ID #: [id number]

## 2023-10-16 ENCOUNTER — Ambulatory Visit: Payer: Medicaid Other | Admitting: Psychiatry

## 2023-11-10 ENCOUNTER — Telehealth: Payer: Self-pay | Admitting: Psychiatry

## 2023-11-10 NOTE — Telephone Encounter (Signed)
 Pt reports that a PA is needed on her Erenumab-aooe (AIMOVIG) 140 MG/ML SOAJ

## 2023-11-15 ENCOUNTER — Other Ambulatory Visit (HOSPITAL_COMMUNITY): Payer: Self-pay

## 2023-11-15 ENCOUNTER — Telehealth: Payer: Self-pay

## 2023-11-15 NOTE — Telephone Encounter (Signed)
 Pharmacy Patient Advocate Encounter   Received notification from Physician's Office that prior authorization for Aimovig  140MG /ML auto-injectors is required/requested.   Insurance verification completed.   The patient is insured through [facility] MEDICAID .   Per test claim: PA required; PA submitted to above mentioned insurance via CoverMyMeds Key/confirmation #/EOC [facility] Status is pending

## 2023-11-15 NOTE — Telephone Encounter (Signed)
 PA request has been Submitted. New Encounter created for follow up. For additional info see Pharmacy Prior Auth telephone encounter from 11/15/2023.

## 2023-11-17 ENCOUNTER — Other Ambulatory Visit (HOSPITAL_COMMUNITY): Payer: Self-pay

## 2023-11-17 NOTE — Telephone Encounter (Signed)
 Pharmacy Patient Advocate Encounter  Received notification from [facility] MEDICAID that Prior Authorization for Aimovig  140MG /ML auto-injectors has been APPROVED from 11/15/2023 to 11/14/2024. Unable to obtain price due to refill too soon rejection, last fill date 11/15/2023 next available fill date1/31/2025   PA #/Case ID/Reference #: PA Case ID #: [id number]

## 2024-01-17 ENCOUNTER — Other Ambulatory Visit: Payer: Self-pay

## 2024-01-17 MED ORDER — AIMOVIG 140 MG/ML ~~LOC~~ SOAJ: 140.0000 mg | SUBCUTANEOUS | 0 refills | Status: DC

## 2024-01-18 ENCOUNTER — Encounter: Payer: Self-pay | Admitting: Physical Therapy

## 2024-01-18 ENCOUNTER — Other Ambulatory Visit: Payer: Self-pay

## 2024-01-18 ENCOUNTER — Ambulatory Visit: Attending: Family Medicine | Admitting: Physical Therapy

## 2024-01-18 DIAGNOSIS — G8929 Other chronic pain: Secondary | ICD-10-CM | POA: Insufficient documentation

## 2024-01-18 DIAGNOSIS — M5442 Lumbago with sciatica, left side: Secondary | ICD-10-CM | POA: Diagnosis present

## 2024-01-18 DIAGNOSIS — R262 Difficulty in walking, not elsewhere classified: Secondary | ICD-10-CM | POA: Insufficient documentation

## 2024-01-18 DIAGNOSIS — R252 Cramp and spasm: Secondary | ICD-10-CM | POA: Insufficient documentation

## 2024-01-18 DIAGNOSIS — R293 Abnormal posture: Secondary | ICD-10-CM | POA: Insufficient documentation

## 2024-01-18 DIAGNOSIS — M6281 Muscle weakness (generalized): Secondary | ICD-10-CM | POA: Diagnosis present

## 2024-01-18 DIAGNOSIS — M25562 Pain in left knee: Secondary | ICD-10-CM | POA: Diagnosis present

## 2024-01-18 NOTE — Therapy (Signed)
 OUTPATIENT PHYSICAL THERAPY THORACOLUMBAR EVALUATION   Patient Name: Linda Cunningham MRN: [id number] DOB:11/21/1965, 58 y.o., female Today's Date: 01/18/2024  END OF SESSION:  PT End of Session - 01/18/24 1110     Visit Number 1    Date for PT Re-Evaluation 03/14/24    Authorization Type UHC Medicaid    PT Start Time 1105    PT Stop Time 1144    PT Time Calculation (min) 39 min    Activity Tolerance Patient tolerated treatment well             Past Medical History:  Diagnosis Date   Abnormal nuclear stress test 09/25/2015   Chest pain 01/05/2015   Epigastric pain    Essential hypertension, benign 12/11/2013   Hypertension    Migraine    OSA on CPAP    Followed by Dr. Catalina Lunger with [facility]   Pain of upper extremity 01/07/2020   Pseudotumor cerebri    SOB (shortness of breath) 09/16/2016   Past Surgical History:  Procedure Laterality Date   CARDIAC CATHETERIZATION N/A 09/25/2015   Procedure: Left Heart Cath and Coronary Angiography;  Surgeon: Peter M Swaziland, MD;  Location: [facility] INVASIVE CV LAB;  Service: Cardiovascular;  Laterality: N/A;   CESAREAN SECTION     Patient Active Problem List   Diagnosis Date Noted   Pain of upper extremity 01/07/2020   SOB (shortness of breath) 09/16/2016   OSA on CPAP    Abnormal nuclear stress test 09/25/2015   Chest pain 01/05/2015   Essential hypertension, benign 12/11/2013    PCP: Delbert Harness MD  REFERRING PROVIDER: Delbert Harness MD  REFERRING DIAG: [id number] left sided low back pain with left sided sciatica  Rationale for Evaluation and Treatment: Rehabilitation  THERAPY DIAG:  Left low back pain with left-sided sciatica; weakness  ONSET DATE: 3 weeks ago  SUBJECTIVE:                                                                                                                                                                                           SUBJECTIVE STATEMENT: 3 weeks ago worsening of left back, lateral  hip pain, buttock (no thigh or lower leg pain). So bad it can make me cry.  Had 5 days of prednisone helped some; took some Baclofen and Tylenol.  The pain came back yesterday 2x.  Aggravated earlier this morning after cleaning the tub. Sits on an exercise ball while chopping things in the kitchen. Presents with SPC  PERTINENT HISTORY:  HTN; chronic knee issues; chronic back issues on/off over the years;  had sciatica during pregnancy Had water ex many  years ago Had PT at [facility] last April, May, June for back and leg weakness   PAIN:   Are you having pain? Yes NPRS scale: 0/10 Pain location: left low back, left lateral hip/buttock Pain orientation: Left  PAIN TYPE: aching and throbbing Pain description: intermittent  Aggravating factors: cleaning the tub, lying down/sleep Unsure how walking is Relieving factors: sitting OK   PRECAUTIONS: None   WEIGHT BEARING RESTRICTIONS: No  FALLS:  Has patient fallen in last 6 months? No  LIVING ENVIRONMENT: Lives with: boyfriend Lives in: House/apartment lives on 2nd floor    OCCUPATION: none  PLOF: Independent  PATIENT GOALS: go back to normal; go to the store (avoiding b/c it takes 2 1/2 hours [employer])  my knees limit me; I'd like to start exercising (complex has a small compact-TM, Elliptical hurts)  OBJECTIVE:  Note: Objective measures were completed at Evaluation unless otherwise noted.  DIAGNOSTIC FINDINGS:  None recently  PATIENT SURVEYS:  Modified Oswestry 20%   COGNITION: Overall cognitive status: Within functional limits for tasks assessed      POSTURE: pelvic drop with attempted SLS on right and left 3-4 sec   PALPATION: No tender points  LUMBAR ROM:   AROM eval  Flexion Fingers to ankles mild pain  Extension 20  Right lateral flexion 20  Left lateral flexion 20  Right rotation   Left rotation    (Blank rows = not tested)  TRUNK STRENGTH:  Decreased activation of transverse abdominus muscles;  abdominals 4-/5; decreased activation of lumbar multifidi; trunk extensors 4-/5  LOWER EXTREMITY ROM:  bil hip flexion 90 degrees; full knee extension, bil knee flexion 90 degrees  LOWER EXTREMITY MMT:  left hip abduction 3+/5, right hip abduction 4-/5; hip extension 4-/5, quads 4-/5  LUMBAR SPECIAL TESTS:  Negative slump test   FUNCTIONAL TESTS:  Able to STS without hands but it's a struggle Able to bend over (straight knees ) to pick up something from the ground   GAIT: Has a SPC, without cane walks with increased base of support and signs of bil glute weakness  TREATMENT DATE: 01/18/24                                                                                                                              Eval  Benefit of aquatic PT for properties of water on joint pain Aquatic PT handouts Initiation of HEP   PATIENT EDUCATION:  Education details: Educated patient on anatomy and physiology of current symptoms, prognosis, plan of care as well as initial self care strategies to promote recovery Person educated: Patient Education method: Explanation Education comprehension: verbalized understanding  HOME EXERCISE PROGRAM: Access Code: [email or web address] URL: [email or web address] Date: 01/18/2024 Prepared by: Lavinia Sharps  Exercises - Clamshell  - 1 x daily - 7 x weekly - 1 sets - 10 reps - Hooklying Isometric Hip Flexion  - 1 x daily - 7 x weekly - 2 sets - 5 reps -  5 hold - Supine Bridge  - 1 x daily - 7 x weekly - 1 sets - 10 reps  ASSESSMENT:  CLINICAL IMPRESSION: Patient is a 58 y.o. female who was seen today for physical therapy evaluation and treatment for left sided back pain and left sciatica. No current symptoms below left lateral hip.  No neural signs.  Chronic knee pain with limited ROM may have contributed to recent exacerbation of left back and hip pain.  The patient would benefit from PT to address trunk and hip range of motion deficits, strength  asymmetries in lumbo/pelvic and hip regions and pain levels that are currently affecting activities of daily living including standing for cooking, sleeping, walking in larger stores like [employer], and doing housework like cleaning the tub.  OBJECTIVE IMPAIRMENTS: decreased activity tolerance, difficulty walking, decreased ROM, decreased strength, impaired perceived functional ability, and pain.   ACTIVITY LIMITATIONS: lifting, bending, standing, squatting, sleeping, and locomotion level  PARTICIPATION LIMITATIONS: meal prep, cleaning, shopping, and community activity  PERSONAL FACTORS: Time since onset of injury/illness/exacerbation and 1-2 comorbidities: HTN, multi body regions affected  are also affecting patient's functional outcome.   REHAB POTENTIAL: Good  CLINICAL DECISION MAKING: Stable/uncomplicated  EVALUATION COMPLEXITY: Low   GOALS: Goals reviewed with patient? Yes  SHORT TERM GOALS: Target date: 02/15/2024   The patient will demonstrate knowledge of basic self care strategies and exercises to promote healing  Baseline: Goal status: INITIAL  2.  The patient will report a 30% improvement in pain levels with functional activities which are currently difficult including sleeping, cooking and housework Baseline:  Goal status: INITIAL  3.  The patient will have improved hip strength to at least 4/5 needed for standing, walking longer distances and descending stairs at home  Baseline:  Goal status: INITIAL      LONG TERM GOALS: Target date: 03/14/2024    The patient will be independent in a safe self progression of a home exercise program to promote further recovery of function  Baseline:  Goal status: INITIAL  2.  The patient will have improved trunk flexor and extensor muscle strength to at least 4+/5 needed for housework and carrying grocery bags Baseline:  Goal status: INITIAL  3.  Patient will have LE strength and stamina to be able to walk around [employer]  pushing the shopping cart Baseline:  Goal status: INITIAL  4.  The patient will report a 60% improvement in left back and hip pain levels with functional activities which are currently including sleeping, cooking and housework Baseline:  Goal status: INITIAL  5.  Modified Oswestry Index improved to   14%  indicating improved function with less pain Baseline:  Goal status: INITIAL   PLAN:  PT FREQUENCY: 2x/week  PT DURATION: 8 weeks  PLANNED INTERVENTIONS: 97164- PT Re-evaluation, 97110-Therapeutic exercises, 97530- Therapeutic activity, 97112- Neuromuscular re-education, 97535- Self Care, 16109- Manual therapy, [id number]- Aquatic Therapy, U0454- Electrical stimulation (unattended), [id number]- Electrical stimulation (manual), [id number]- Ultrasound, [id number]- Traction (mechanical), [id number]- Ionotophoresis 4mg /ml Dexamethasone, Patient/Family education, Stair training, Taping, Dry Needling, Joint mobilization, Spinal manipulation, Spinal mobilization, Cryotherapy, and Moist heat.  PLAN FOR NEXT SESSION: review initial HEP and progress as appropriate targeting core and hip strengthening; aquatic PT secondary to chronic knee issues and back issues sensitive to load; Nu-Step or bike  [place], PT 01/18/24 7:58 PM Phone: [phone] Fax: [phone]

## 2024-01-18 NOTE — Patient Instructions (Signed)
   [facility] Welcome to [facility]! Here you will find all the information you will need regarding your pool therapy. If you have further questions at any time, please call our office at [phone]. After completing your initial evaluation in the [facility] clinic, you may be eligible to complete a portion of your therapy in the pool. A typical week of therapy will consist of 1-2 typical physical therapy visits at our [facility] location and an additional session of therapy in the pool located at the [facility] at [facility]. [address] [place] [address]. The phone number at the pool site is [phone]. Please call this number if you are running late or need to cancel your appointment.  Aquatic therapy will be offered on Wednesday mornings and Friday afternoons. Each session will last approximately 45 minutes. All scheduling and payments for aquatic therapy sessions, including cancelations, will be done through our [facility] location.  To be eligible for aquatic therapy, these criteria must be met: You must be able to independently change in the locker room and get to the pool deck. A caregiver can come with you to help if needed. There are benches for a caregiver to sit on next to the pool. No one with an open wound is permitted in the pool.  Handicap parking is available in the front and there is a drop off option for even closer accessibility. Please arrive 15 minutes prior to your appointment to prepare for your pool session. You must sign in at the front desk upon your arrival. Please be sure to attend to any toileting needs prior to entering the pool. Locker rooms for changing are available.  There is direct access to the pool deck from the locker room. You can lock your belongings in a locker or bring them with you poolside. Your therapist will greet you on the pool deck. There may be other swimmers in the pool at the  same time but your session is one-on-one with the therapist.

## 2024-01-25 ENCOUNTER — Ambulatory Visit: Admitting: Physical Therapy

## 2024-01-25 DIAGNOSIS — R262 Difficulty in walking, not elsewhere classified: Secondary | ICD-10-CM

## 2024-01-25 DIAGNOSIS — M5442 Lumbago with sciatica, left side: Secondary | ICD-10-CM

## 2024-01-25 DIAGNOSIS — M6281 Muscle weakness (generalized): Secondary | ICD-10-CM

## 2024-01-25 DIAGNOSIS — G8929 Other chronic pain: Secondary | ICD-10-CM

## 2024-01-25 DIAGNOSIS — R252 Cramp and spasm: Secondary | ICD-10-CM

## 2024-01-25 DIAGNOSIS — R293 Abnormal posture: Secondary | ICD-10-CM

## 2024-01-25 NOTE — Therapy (Signed)
 OUTPATIENT PHYSICAL THERAPY THORACOLUMBAR TREATMENT   Patient Name: Linda Cunningham MRN: [id number] DOB:16-Sep-1966, 58 y.o., female Today's Date: 01/25/2024  END OF SESSION:  PT End of Session - 01/25/24 1150     Visit Number 2    Date for PT Re-Evaluation 03/14/24    Authorization Type [employer] Medicaid    PT Start Time 1150   late sign in   PT Stop Time 1230    PT Time Calculation (min) 40 min    Activity Tolerance Patient tolerated treatment well              Past Medical History:  Diagnosis Date   Abnormal nuclear stress test 09/25/2015   Chest pain 01/05/2015   Epigastric pain    Essential hypertension, benign 12/11/2013   Hypertension    Migraine    OSA on CPAP    Followed by Dr. Catalina Lunger with [facility]   Pain of upper extremity 01/07/2020   Pseudotumor cerebri    SOB (shortness of breath) 09/16/2016   Past Surgical History:  Procedure Laterality Date   CARDIAC CATHETERIZATION N/A 09/25/2015   Procedure: Left Heart Cath and Coronary Angiography;  Surgeon: Peter M Swaziland, MD;  Location: [facility] INVASIVE CV LAB;  Service: Cardiovascular;  Laterality: N/A;   CESAREAN SECTION     Patient Active Problem List   Diagnosis Date Noted   Pain of upper extremity 01/07/2020   SOB (shortness of breath) 09/16/2016   OSA on CPAP    Abnormal nuclear stress test 09/25/2015   Chest pain 01/05/2015   Essential hypertension, benign 12/11/2013    PCP: Delbert Harness MD  REFERRING PROVIDER: Delbert Harness MD  REFERRING DIAG: [phone] left sided low back pain with left sided sciatica  Rationale for Evaluation and Treatment: Rehabilitation  THERAPY DIAG:  Left low back pain with left-sided sciatica; weakness  ONSET DATE: 3 weeks ago  SUBJECTIVE:                                                                                                                                                                                           SUBJECTIVE STATEMENT: Pt states both knees are  hurting a little bit. Pt reports she had a bad flare up of her L low back pain (10/10 pain) -- had to go to ED and got some prescription meds. Pt states she's been able to do some of the exercises. Has been using heat primarily.  ` PERTINENT HISTORY:  HTN; chronic knee issues; chronic back issues on/off over the years;  had sciatica during pregnancy Had water ex many years ago Had PT at [facility] last April, May,  June for back and leg weakness   PAIN:  Are you having pain? Yes NPRS scale: 0/10 currently Pain location: left low back, left lateral hip/buttock Pain orientation: Left  PAIN TYPE: aching and throbbing Pain description: intermittent  Aggravating factors: cleaning the tub, lying down/sleep Unsure how walking is Relieving factors: sitting OK    PRECAUTIONS: None   WEIGHT BEARING RESTRICTIONS: No  FALLS:  Has patient fallen in last 6 months? No  LIVING ENVIRONMENT: Lives with: boyfriend Lives in: House/apartment lives on 2nd floor    OCCUPATION: none  PLOF: Independent  PATIENT GOALS: go back to normal; go to the store (avoiding b/c it takes 2 1/2 hours [employer])  my knees limit me; I'd like to start exercising (complex has a small compact-TM, Elliptical hurts)  OBJECTIVE:  Note: Objective measures were completed at Evaluation unless otherwise noted.  DIAGNOSTIC FINDINGS:  None recently  PATIENT SURVEYS:  Modified Oswestry 20%   COGNITION: Overall cognitive status: Within functional limits for tasks assessed      POSTURE: pelvic drop with attempted SLS on right and left 3-4 sec   PALPATION: No tender points  LUMBAR ROM:   AROM eval  Flexion Fingers to ankles mild pain  Extension 20  Right lateral flexion 20  Left lateral flexion 20  Right rotation   Left rotation    (Blank rows = not tested)  TRUNK STRENGTH:  Decreased activation of transverse abdominus muscles; abdominals 4-/5; decreased activation of lumbar multifidi; trunk extensors  4-/5  LOWER EXTREMITY ROM:  bil hip flexion 90 degrees; full knee extension, bil knee flexion 90 degrees  LOWER EXTREMITY MMT:  left hip abduction 3+/5, right hip abduction 4-/5; hip extension 4-/5, quads 4-/5  LUMBAR SPECIAL TESTS:  Negative slump test   FUNCTIONAL TESTS:  Able to STS without hands but it's a struggle Able to bend over (straight knees ) to pick up something from the ground   GAIT: Has a SPC, without cane walks with increased base of support and signs of bil glute weakness  TREATMENT DATE:  01/25/24 Nustep L5 x 6 min UEs/LEs Supine  LTR x10  Piriformis stretch x 30"  Figure 4 stretch x 30"  Bridge 2x10  Isometric hip flexion 2x5x5" Sidelying  Clamshell 2x10  Hip abd x10 Counter plank 2x20" Counter side plank 2x20" Standing glute med iso 2x5x5"   01/18/24                                                                                                                              Eval  Benefit of aquatic PT for properties of water on joint pain Aquatic PT handouts Initiation of HEP   PATIENT EDUCATION:  Education details: Educated patient on anatomy and physiology of current symptoms, prognosis, plan of care as well as initial self care strategies to promote recovery Person educated: Patient Education method: Explanation Education comprehension: verbalized understanding  HOME EXERCISE PROGRAM: Access Code: [email or web address] URL: [email or web address]  Date: 01/18/2024 Prepared by: Lavinia Sharps  Exercises - Clamshell  - 1 x daily - 7 x weekly - 1 sets - 10 reps - Hooklying Isometric Hip Flexion  - 1 x daily - 7 x weekly - 2 sets - 5 reps - 5 hold - Supine Bridge  - 1 x daily - 7 x weekly - 1 sets - 10 reps  ASSESSMENT:  CLINICAL IMPRESSION: Reviewed HEP. Provided stretches that pt can perform in bed prior to waking up which will hopefully help with her initial morning pain. Continued to work on core and hip strengthening today.   From  eval: Patient is a 58 y.o. female who was seen today for physical therapy evaluation and treatment for left sided back pain and left sciatica. No current symptoms below left lateral hip.  No neural signs.  Chronic knee pain with limited ROM may have contributed to recent exacerbation of left back and hip pain.  The patient would benefit from PT to address trunk and hip range of motion deficits, strength asymmetries in lumbo/pelvic and hip regions and pain levels that are currently affecting activities of daily living including standing for cooking, sleeping, walking in larger stores like [employer], and doing housework like cleaning the tub.  OBJECTIVE IMPAIRMENTS: decreased activity tolerance, difficulty walking, decreased ROM, decreased strength, impaired perceived functional ability, and pain.   ACTIVITY LIMITATIONS: lifting, bending, standing, squatting, sleeping, and locomotion level  PARTICIPATION LIMITATIONS: meal prep, cleaning, shopping, and community activity  PERSONAL FACTORS: Time since onset of injury/illness/exacerbation and 1-2 comorbidities: HTN, multi body regions affected  are also affecting patient's functional outcome.   REHAB POTENTIAL: Good  CLINICAL DECISION MAKING: Stable/uncomplicated  EVALUATION COMPLEXITY: Low   GOALS: Goals reviewed with patient? Yes  SHORT TERM GOALS: Target date: 02/15/2024   The patient will demonstrate knowledge of basic self care strategies and exercises to promote healing  Baseline: Goal status: INITIAL  2.  The patient will report a 30% improvement in pain levels with functional activities which are currently difficult including sleeping, cooking and housework Baseline:  Goal status: INITIAL  3.  The patient will have improved hip strength to at least 4/5 needed for standing, walking longer distances and descending stairs at home  Baseline:  Goal status: INITIAL      LONG TERM GOALS: Target date: 03/14/2024    The patient will  be independent in a safe self progression of a home exercise program to promote further recovery of function  Baseline:  Goal status: INITIAL  2.  The patient will have improved trunk flexor and extensor muscle strength to at least 4+/5 needed for housework and carrying grocery bags Baseline:  Goal status: INITIAL  3.  Patient will have LE strength and stamina to be able to walk around [employer] pushing the shopping cart Baseline:  Goal status: INITIAL  4.  The patient will report a 60% improvement in left back and hip pain levels with functional activities which are currently including sleeping, cooking and housework Baseline:  Goal status: INITIAL  5.  Modified Oswestry Index improved to   14%  indicating improved function with less pain Baseline:  Goal status: INITIAL   PLAN:  PT FREQUENCY: 2x/week  PT DURATION: 8 weeks  PLANNED INTERVENTIONS: 97164- PT Re-evaluation, 97110-Therapeutic exercises, 97530- Therapeutic activity, [id number]- Neuromuscular re-education, 97535- Self Care, 30160- Manual therapy, [id number]- Aquatic Therapy, [id number]- Electrical stimulation (unattended), [id number]- Electrical stimulation (manual), [id number]- Ultrasound, [id number]- Traction (mechanical), [id number]- Ionotophoresis 4mg /ml Dexamethasone, Patient/Family education, Stair training, Taping,  Dry Needling, Joint mobilization, Spinal manipulation, Spinal mobilization, Cryotherapy, and Moist heat.  PLAN FOR NEXT SESSION: review initial HEP and progress as appropriate targeting core and hip strengthening; aquatic PT secondary to chronic knee issues and back issues sensitive to load; Nu-Step or bike  [facility] April Dell Ponto, PT 01/25/24 11:50 AM Phone: [phone] Fax: [phone]

## 2024-01-29 ENCOUNTER — Ambulatory Visit (HOSPITAL_BASED_OUTPATIENT_CLINIC_OR_DEPARTMENT_OTHER): Attending: Family Medicine | Admitting: Physical Therapy

## 2024-01-29 ENCOUNTER — Encounter (HOSPITAL_BASED_OUTPATIENT_CLINIC_OR_DEPARTMENT_OTHER): Payer: Self-pay | Admitting: Physical Therapy

## 2024-01-29 DIAGNOSIS — M6281 Muscle weakness (generalized): Secondary | ICD-10-CM | POA: Diagnosis present

## 2024-01-29 DIAGNOSIS — R293 Abnormal posture: Secondary | ICD-10-CM | POA: Diagnosis present

## 2024-01-29 DIAGNOSIS — R262 Difficulty in walking, not elsewhere classified: Secondary | ICD-10-CM | POA: Insufficient documentation

## 2024-01-29 DIAGNOSIS — M5442 Lumbago with sciatica, left side: Secondary | ICD-10-CM | POA: Insufficient documentation

## 2024-01-29 NOTE — Therapy (Signed)
 OUTPATIENT PHYSICAL THERAPY THORACOLUMBAR TREATMENT   Patient Name: Linda Cunningham MRN: [id number] DOB:1966/03/19, 58 y.o., female Today's Date: 01/29/2024  END OF SESSION:  PT End of Session - 01/29/24 0811     Visit Number 3    Date for PT Re-Evaluation 03/14/24    Authorization Type [employer] Medicaid    PT Start Time 0800    PT Stop Time 0839    PT Time Calculation (min) 39 min    Activity Tolerance Patient tolerated treatment well              Past Medical History:  Diagnosis Date   Abnormal nuclear stress test 09/25/2015   Chest pain 01/05/2015   Epigastric pain    Essential hypertension, benign 12/11/2013   Hypertension    Migraine    OSA on CPAP    Followed by Dr. Catalina Lunger with [facility]   Pain of upper extremity 01/07/2020   Pseudotumor cerebri    SOB (shortness of breath) 09/16/2016   Past Surgical History:  Procedure Laterality Date   CARDIAC CATHETERIZATION N/A 09/25/2015   Procedure: Left Heart Cath and Coronary Angiography;  Surgeon: Peter M Swaziland, MD;  Location: [facility] INVASIVE CV LAB;  Service: Cardiovascular;  Laterality: N/A;   CESAREAN SECTION     Patient Active Problem List   Diagnosis Date Noted   Pain of upper extremity 01/07/2020   SOB (shortness of breath) 09/16/2016   OSA on CPAP    Abnormal nuclear stress test 09/25/2015   Chest pain 01/05/2015   Essential hypertension, benign 12/11/2013    PCP: Delbert Harness MD  REFERRING PROVIDER: Delbert Harness MD  REFERRING DIAG: [phone] left sided low back pain with left sided sciatica  Rationale for Evaluation and Treatment: Rehabilitation  THERAPY DIAG:  Left low back pain with left-sided sciatica; weakness  ONSET DATE: 3 weeks ago  SUBJECTIVE:                                                                                                                                                                                           SUBJECTIVE STATEMENT: Pt states both knees are hurting a little  bit. Pt reports she had a bad flare up of her L low back pain (10/10 pain) -- had to go to ED and got some prescription meds. Pt states she's been able to do some of the exercises. Has been using heat primarily.   POOL ACCESS:  her apt complex has outdoor pool.  May look into going to [facility] for additional pool access.   PERTINENT HISTORY:  HTN; chronic knee issues; chronic back issues on/off over the years;  had sciatica during pregnancy Had water ex many years ago Had PT at [facility] last April, May, June for back and leg weakness   PAIN:  Are you having pain? Yes NPRS scale: 1/10 currently, took ibuprofen prior to session Pain location: left low back, left lateral hip/buttock Pain orientation: Left  PAIN TYPE: aching  Pain description: intermittent  Aggravating factors: cleaning the tub, lying down/sleep Unsure how walking is Relieving factors: sitting OK    PRECAUTIONS: None   WEIGHT BEARING RESTRICTIONS: No  FALLS:  Has patient fallen in last 6 months? No  LIVING ENVIRONMENT: Lives with: boyfriend Lives in: House/apartment lives on 2nd floor    OCCUPATION: none  PLOF: Independent  PATIENT GOALS: go back to normal; go to the store (avoiding b/c it takes 2 1/2 hours [employer])  my knees limit me; I'd like to start exercising (complex has a small compact-TM, Elliptical hurts)  OBJECTIVE:  Note: Objective measures were completed at Evaluation unless otherwise noted.  DIAGNOSTIC FINDINGS:  None recently  PATIENT SURVEYS:  Modified Oswestry 20%   COGNITION: Overall cognitive status: Within functional limits for tasks assessed      POSTURE: pelvic drop with attempted SLS on right and left 3-4 sec   PALPATION: No tender points  LUMBAR ROM:   AROM eval  Flexion Fingers to ankles mild pain  Extension 20  Right lateral flexion 20  Left lateral flexion 20  Right rotation   Left rotation    (Blank rows = not tested)  TRUNK STRENGTH:  Decreased activation of  transverse abdominus muscles; abdominals 4-/5; decreased activation of lumbar multifidi; trunk extensors 4-/5  LOWER EXTREMITY ROM:  bil hip flexion 90 degrees; full knee extension, bil knee flexion 90 degrees  LOWER EXTREMITY MMT:  left hip abduction 3+/5, right hip abduction 4-/5; hip extension 4-/5, quads 4-/5  LUMBAR SPECIAL TESTS:  Negative slump test   FUNCTIONAL TESTS:  Able to STS without hands but it's a struggle Able to bend over (straight knees ) to pick up something from the ground   GAIT: Has a SPC, without cane walks with increased base of support and signs of bil glute weakness  TREATMENT DATE:  [facility] Adult PT Treatment:                                                DATE: 01/29/24 Pt seen for aquatic therapy today.  Treatment took place in water 3.5-4.75 ft in depth at the [facility] pool. Temp of water was 91.  Pt entered/exited the pool via stairs independently with bilat rail.  - Intro to aquatic therapy principles - unsupported walking forward/ backward multiple laps - side stepping with arm addct/ abdct -> with rainbow hand floats (cues for sequence and posture) - marching forward/ backward with reciprocal row motion  - UE on wall: hip add/abd x 10 ; hip flexion /extension x 10; single leg clams x 8 each LE (cues to reduce height of leg  - return to walking forward/ backward  - TrA set with 1/2 hollow noodle -> full hollow noodle -> bilat rainbow hand float pull down to thighs x 10  - straddling noodle without UE support: cycling, hip abdct/ addct with feet suspended - UE on rail at stairs: attempted standing quad stretch (limited range, will use noodle next visit); L/R hamstring stretch x 15s  Pt  requires the buoyancy and hydrostatic pressure of water for support, and to offload joints by unweighting joint load by at least 50 % in navel deep water and by at least 75-80% in chest to neck deep water.  Viscosity of the water is needed for resistance of  strengthening. Water current perturbations provides challenge to standing balance requiring increased core activation.     01/25/24 Nustep L5 x 6 min UEs/LEs Supine  LTR x10  Piriformis stretch x 30"  Figure 4 stretch x 30"  Bridge 2x10  Isometric hip flexion 2x5x5" Sidelying  Clamshell 2x10  Hip abd x10 Counter plank 2x20" Counter side plank 2x20" Standing glute med iso 2x5x5"   01/18/24                                                                                                                              Eval  Benefit of aquatic PT for properties of water on joint pain Aquatic PT handouts Initiation of HEP   PATIENT EDUCATION:  Education details: intro to aquatic therapy  Person educated: Patient Education method: Explanation Education comprehension: verbalized understanding  HOME EXERCISE PROGRAM: [place] Access Code: [email or web address] URL: [email or web address] Date: 01/18/2024  ASSESSMENT:  CLINICAL IMPRESSION: Pt demonstrates safety and independence in aquatic setting with therapist instructing from deck. Pt demonstrates confidence in setting, moving throughout all depths easily.  Pt is directed through various movement patterns in standing position without difficulty. Some pain reported in posterior LLE with hip abdct; eased with change in exercise.  Goals are ongoing.      From eval: Patient is a 58 y.o. female who was seen today for physical therapy evaluation and treatment for left sided back pain and left sciatica. No current symptoms below left lateral hip.  No neural signs.  Chronic knee pain with limited ROM may have contributed to recent exacerbation of left back and hip pain.  The patient would benefit from PT to address trunk and hip range of motion deficits, strength asymmetries in lumbo/pelvic and hip regions and pain levels that are currently affecting activities of daily living including standing for cooking, sleeping, walking in larger stores  like [employer], and doing housework like cleaning the tub.  OBJECTIVE IMPAIRMENTS: decreased activity tolerance, difficulty walking, decreased ROM, decreased strength, impaired perceived functional ability, and pain.   ACTIVITY LIMITATIONS: lifting, bending, standing, squatting, sleeping, and locomotion level  PARTICIPATION LIMITATIONS: meal prep, cleaning, shopping, and community activity  PERSONAL FACTORS: Time since onset of injury/illness/exacerbation and 1-2 comorbidities: HTN, multi body regions affected  are also affecting patient's functional outcome.   REHAB POTENTIAL: Good  CLINICAL DECISION MAKING: Stable/uncomplicated  EVALUATION COMPLEXITY: Low   GOALS: Goals reviewed with patient? Yes  SHORT TERM GOALS: Target date: 02/15/2024   The patient will demonstrate knowledge of basic self care strategies and exercises to promote healing  Baseline: Goal status: INITIAL  2.  The patient will report a 30% improvement in pain levels with functional activities  which are currently difficult including sleeping, cooking and housework Baseline:  Goal status: INITIAL  3.  The patient will have improved hip strength to at least 4/5 needed for standing, walking longer distances and descending stairs at home  Baseline:  Goal status: INITIAL      LONG TERM GOALS: Target date: 03/14/2024    The patient will be independent in a safe self progression of a home exercise program to promote further recovery of function  Baseline:  Goal status: INITIAL  2.  The patient will have improved trunk flexor and extensor muscle strength to at least 4+/5 needed for housework and carrying grocery bags Baseline:  Goal status: INITIAL  3.  Patient will have LE strength and stamina to be able to walk around [employer] pushing the shopping cart Baseline:  Goal status: INITIAL  4.  The patient will report a 60% improvement in left back and hip pain levels with functional activities which are  currently including sleeping, cooking and housework Baseline:  Goal status: INITIAL  5.  Modified Oswestry Index improved to   14%  indicating improved function with less pain Baseline:  Goal status: INITIAL   PLAN:  PT FREQUENCY: 2x/week  PT DURATION: 8 weeks  PLANNED INTERVENTIONS: 97164- PT Re-evaluation, 97110-Therapeutic exercises, 97530- Therapeutic activity, 97112- Neuromuscular re-education, 97535- Self Care, 16109- Manual therapy, [id number]- Aquatic Therapy, U0454- Electrical stimulation (unattended), [id number]- Electrical stimulation (manual), [id number]- Ultrasound, [id number]- Traction (mechanical), [id number]- Ionotophoresis 4mg /ml Dexamethasone, Patient/Family education, Stair training, Taping, Dry Needling, Joint mobilization, Spinal manipulation, Spinal mobilization, Cryotherapy, and Moist heat.  PLAN FOR NEXT SESSION: review initial HEP and progress as appropriate targeting core and hip strengthening; aquatic PT secondary to chronic knee issues and back issues sensitive to load; Nu-Step or bike   Mayer Camel, [place] 01/29/24 9:47 AM [facility] [address] [place], [place], [address] Phone: [phone]   Fax:  [phone]

## 2024-02-01 ENCOUNTER — Encounter: Payer: Self-pay | Admitting: Neurology

## 2024-02-01 ENCOUNTER — Ambulatory Visit: Payer: Medicaid Other | Admitting: Neurology

## 2024-02-01 VITALS — BP 124/88 | HR 94 | Ht 59.0 in | Wt 244.0 lb

## 2024-02-01 DIAGNOSIS — G932 Benign intracranial hypertension: Secondary | ICD-10-CM

## 2024-02-01 DIAGNOSIS — G43019 Migraine without aura, intractable, without status migrainosus: Secondary | ICD-10-CM

## 2024-02-01 NOTE — Progress Notes (Signed)
 CC:  headaches  Follow-up Visit  Last visit: 07/31/2023  Interval history:  02/01/2024: 58 year old female with a history of IIH, migraines, OSA, HTN, HLD who follows in clinic for migraines and IIH.  This is a patient of my former colleague Dr. Delena Bali who is transitioning to my care. She is on Topiramate and at last visit Aimovig was prescribed (Emgality stopped being covered by insurance). She did not like the rizatriptan, Nurtec and Ubrelvy were ineffective. Headaches will come out of the blue. She is not taking the topiramate every day, advised her to take it daily for migraine AND IDIOPATHIC INTRACRANIAL HYPERTENSION. Sees ophthalmologist and she states is stable and not concerning for papilledema. Aimovig is being covered by insurance but doing well as far as migraines go, she occ gets a migraine, she has not tried the zavzpret. She has knee and back problems. Her blood pressure is elevated and that can cause headaches as well, she sees a spine doctor. Take BP at home several times a day. No papilledema today. She sees Dr. Randon Goldsmith once a year next in July this year. Hsn;t taken the topiramate for 2 weeks, advised her. Last LP was 2016 and hasn;t had an MRI for many years but considering doing well, no papilledema, neuro exam,   Patient complains of symptoms per HPI as well as the following symptoms: none . Pertinent negatives and positives per HPI. All others negative   Interval History: Insurance stopped covering [place], so she has not been able to take it for the past few months. Continues to take Topamax 100 mg at bedtime, which helps but continues to cause paresthesias. She is averaging 5 migraines per month. Baclofen and Tylenol help reduce her headaches but do not fully relieve them.  States she saw ophthalmology in August who did not see any papilledema on her exam.   Headache days per month: 5 Headache free days per month: 25  Current Headache Regimen: Preventative: Emgality 120  mg monthly, Topamax 100 mg QHS Abortive: baclofen 10 mg TID PRN, tylenol    Prior Therapies                                  Prevention: Aimovig 140 mg monthly Emgality 120 mg monthly - constipation Topamax 100 mg QHS Amlodipine 5 mg daily Losartan 100 mg daily    Rescue: Maxalt 10 mg PRN Nurtec - lack of efficacy Ubrelvy - lack of efficacy Baclofen 10 mg TID PRN She has been told to avoid triptans due to HTN per cardiology and to avoid NSAIDs due to kidney function  Physical Exam:   Vital Signs: BP 124/88 (BP Location: Right Arm, Patient Position: Sitting, Cuff Size: Large) Comment: manual recheck  Pulse 94   Ht 4\' 11"  (1.499 m)   Wt 244 lb (110.7 kg)   LMP 10/10/2016   SpO2 97%   BMI 49.28 kg/m   Physical exam: Exam: Gen: NAD, conversant, well nourised, obese, well groomed                     CV: RRR, no MRG. No Carotid Bruits. No peripheral edema, warm, nontender Eyes: Conjunctivae clear without exudates or hemorrhage  Neuro: Detailed Neurologic Exam  Speech:    Speech is normal; fluent and spontaneous with normal comprehension.  Cognition:    The patient is oriented to person, place, and time;     recent and  remote memory intact;     language fluent;     normal attention, concentration,     fund of knowledge Cranial Nerves:    The pupils are equal, round, and reactive to light. The fundi are normal and spontaneous venous pulsations are present. Visual fields are full to finger confrontation. Extraocular movements are intact. Trigeminal sensation is intact and the muscles of mastication are normal. The face is symmetric. The palate elevates in the midline. Hearing intact. Voice is normal. Shoulder shrug is normal. The tongue has normal motion without fasciculations.   Coordination: nml  Gait: nml  Motor Observation:    No asymmetry, no atrophy, and no involuntary movements noted. Tone:    Normal muscle tone.    Posture:    Posture is normal. normal  erect    Strength:    Strength is V/V in the upper and lower limbs.      Sensation: intact to LT     Reflex Exam:  DTR's:    Deep tendon reflexes in the upper and lower extremities are symmetrical bilaterally.   Toes:    The toes are downgoing bilaterally.   Clonus:    Clonus is absent.  IMPRESSION: 58 year old female with a history of IIH, migraines, OSA, HTN, HLD who presents for follow up of migraines and IIH. Has nottaken the topiramate for 2 weeks, advised her to take and risk of permanent vision loss in IDIOPATHIC INTRACRANIAL HYPERTENSION. Last LP was 2016 and hasn;t had an MRI for many years but considering doing well, no papilledema, neuro exam, do not feel repeat is warranted but go to ED for any changes in vision or new/worseninging symptoms.   PLAN: IDIOPATHIC INTRACRANIAL HYPERTENSION: Non adherent to Topiramate, advised to take it daily. Sees ophthalmologist and she states is stable and not concerning for papilledema. Exam today appears normal fundi as well. No papilledema today. She sees Dr. Randon Goldsmith once a year next in July this year.  Chronic Migraine prevention: Doing well on Aimovig. (Emgality stopped being covered by insurance). Continue aimovig non adherent to topiramate, discussed and encouraged to take.  Chronic Migraine Acute migraine management: She did not like the rizatriptan, Nurtec and Ubrelvy were ineffective. she has not tried the zavzpret. Baclofen prn.  Other considerations in headache:  Her blood pressure is elevated and that can cause headaches as well,   Back pain: he sees a spine doctor. Take BP at home several times a day.   Meds ordered this encounter  Medications   baclofen (LIORESAL) 10 MG tablet    Sig: Take 1 tablet (10 mg total) by mouth 3 (three) times daily.    Dispense:  30 each    Refill:  11   Erenumab-aooe (AIMOVIG) 140 MG/ML SOAJ    Sig: Inject 140 mg into the skin every 30 (thirty) days.    Dispense:  1.12 mL    Refill:  11    topiramate (TOPAMAX) 100 MG tablet    Sig: Take 1 tablet (100 mg total) by mouth daily.    Dispense:  90 tablet    Refill:  4     I spent 20 minutes of face-to-face and non-face-to-face time with patient on the  1. Intractable migraine without aura and without status migrainosus   2. IIH (idiopathic intracranial hypertension)    diagnosis.  This included previsit chart review, lab review, study review, order entry, electronic health record documentation, patient education on the different diagnostic and therapeutic options, counseling and coordination  of care, risks and benefits of management, compliance, or risk factor reduction  Linda Dean, MD

## 2024-02-04 ENCOUNTER — Encounter: Payer: Self-pay | Admitting: Neurology

## 2024-02-04 DIAGNOSIS — G43019 Migraine without aura, intractable, without status migrainosus: Secondary | ICD-10-CM | POA: Insufficient documentation

## 2024-02-04 DIAGNOSIS — G932 Benign intracranial hypertension: Secondary | ICD-10-CM | POA: Insufficient documentation

## 2024-02-04 MED ORDER — TOPIRAMATE 100 MG PO TABS: 100.0000 mg | ORAL_TABLET | Freq: Every day | ORAL | 4 refills | Status: AC

## 2024-02-04 MED ORDER — AIMOVIG 140 MG/ML ~~LOC~~ SOAJ: 140.0000 mg | SUBCUTANEOUS | 11 refills | Status: DC

## 2024-02-04 MED ORDER — BACLOFEN 10 MG PO TABS: 10.0000 mg | ORAL_TABLET | Freq: Three times a day (TID) | ORAL | 11 refills | Status: AC

## 2024-02-06 ENCOUNTER — Encounter (HOSPITAL_BASED_OUTPATIENT_CLINIC_OR_DEPARTMENT_OTHER): Payer: Self-pay | Admitting: Physical Therapy

## 2024-02-06 ENCOUNTER — Ambulatory Visit (HOSPITAL_BASED_OUTPATIENT_CLINIC_OR_DEPARTMENT_OTHER): Attending: Family Medicine | Admitting: Physical Therapy

## 2024-02-06 DIAGNOSIS — R293 Abnormal posture: Secondary | ICD-10-CM

## 2024-02-06 DIAGNOSIS — M6281 Muscle weakness (generalized): Secondary | ICD-10-CM | POA: Diagnosis present

## 2024-02-06 DIAGNOSIS — M25562 Pain in left knee: Secondary | ICD-10-CM | POA: Insufficient documentation

## 2024-02-06 DIAGNOSIS — G8929 Other chronic pain: Secondary | ICD-10-CM | POA: Diagnosis present

## 2024-02-06 DIAGNOSIS — M170 Bilateral primary osteoarthritis of knee: Secondary | ICD-10-CM | POA: Insufficient documentation

## 2024-02-06 DIAGNOSIS — R252 Cramp and spasm: Secondary | ICD-10-CM | POA: Diagnosis present

## 2024-02-06 DIAGNOSIS — M5442 Lumbago with sciatica, left side: Secondary | ICD-10-CM | POA: Diagnosis present

## 2024-02-06 DIAGNOSIS — R262 Difficulty in walking, not elsewhere classified: Secondary | ICD-10-CM | POA: Diagnosis present

## 2024-02-06 NOTE — Therapy (Signed)
 OUTPATIENT PHYSICAL THERAPY THORACOLUMBAR TREATMENT   Patient Name: Linda Cunningham MRN: [id number] DOB:[birth year], 58 y.o., female Today's Date: 02/06/2024  END OF SESSION:  PT End of Session - 02/06/24 0938     Visit Number 4    Date for PT Re-Evaluation 03/14/24    Authorization Type [employer]    PT Start Time [phone]    PT Stop Time 1013    PT Time Calculation (min) 39 min    Behavior During Therapy [facility] for tasks assessed/performed              Past Medical History:  Diagnosis Date   Abnormal nuclear stress test 09/25/2015   Back pain    radiating to leg on L side   Chest pain 01/05/2015   Epigastric pain    Essential hypertension, benign 12/11/2013   Hypertension    Migraine    OSA on CPAP    Followed by Dr. Catalina Lunger with [facility]   Pain of upper extremity 01/07/2020   Pseudotumor cerebri    SOB (shortness of breath) 09/16/2016   Past Surgical History:  Procedure Laterality Date   CARDIAC CATHETERIZATION N/A 09/25/2015   Procedure: Left Heart Cath and Coronary Angiography;  Surgeon: Peter M [place], MD;  Location: [facility] INVASIVE CV LAB;  Service: Cardiovascular;  Laterality: N/A;   CESAREAN SECTION     Patient Active Problem List   Diagnosis Date Noted   Intractable migraine without aura and without status migrainosus 02/04/2024   IIH (idiopathic intracranial hypertension) 02/04/2024   Pain of upper extremity 01/07/2020   SOB (shortness of breath) 09/16/2016   OSA on CPAP    Abnormal nuclear stress test 09/25/2015   Chest pain 01/05/2015   Essential hypertension, benign 12/11/2013    PCP: Delbert Harness MD  REFERRING PROVIDER: Delbert Harness MD  REFERRING DIAG: M54.42 left sided low back pain with left sided sciatica  Rationale for Evaluation and Treatment: Rehabilitation  THERAPY DIAG:  Left low back pain with left-sided sciatica; weakness  ONSET DATE: 3 weeks ago  SUBJECTIVE:                                                                                                                                                                                            SUBJECTIVE STATEMENT: Pt states she visited her back doctor; will be receiving knee injections in L knee next week.     POOL ACCESS:  her apt complex has outdoor pool.  May look into going to [facility] for additional pool access.   PERTINENT HISTORY:  HTN; chronic knee issues; chronic back issues on/off over the years;  had sciatica during pregnancy Had water ex many years ago Had PT at [facility] last April, May, June for back and leg weakness   PAIN:  Are you having pain? Yes NPRS scale: 1/10 back; 4/10 bilat knees Pain location: see above Pain orientation:  PAIN TYPE: aching  Pain description: intermittent  Aggravating factors: cleaning the tub, lying down/sleep Unsure how walking is Relieving factors: sitting OK    PRECAUTIONS: None   WEIGHT BEARING RESTRICTIONS: No  FALLS:  Has patient fallen in last 6 months? No  LIVING ENVIRONMENT: Lives with: boyfriend Lives in: House/apartment lives on 2nd floor    OCCUPATION: none  PLOF: Independent  PATIENT GOALS: go back to normal; go to the store (avoiding b/c it takes 2 1/2 hours [employer])  my knees limit me; I'd like to start exercising (complex has a small compact-TM, Elliptical hurts)  OBJECTIVE:  Note: Objective measures were completed at Evaluation unless otherwise noted.  DIAGNOSTIC FINDINGS:  None recently  PATIENT SURVEYS:  Modified Oswestry 20%   COGNITION: Overall cognitive status: Within functional limits for tasks assessed      POSTURE: pelvic drop with attempted SLS on right and left 3-4 sec   PALPATION: No tender points  LUMBAR ROM:   AROM eval  Flexion Fingers to ankles mild pain  Extension 20  Right lateral flexion 20  Left lateral flexion 20  Right rotation   Left rotation    (Blank rows = not tested)  TRUNK STRENGTH:  Decreased activation of transverse abdominus muscles;  abdominals 4-/5; decreased activation of lumbar multifidi; trunk extensors 4-/5  LOWER EXTREMITY ROM:  bil hip flexion 90 degrees; full knee extension, bil knee flexion 90 degrees  LOWER EXTREMITY MMT:  left hip abduction 3+/5, right hip abduction 4-/5; hip extension 4-/5, quads 4-/5  LUMBAR SPECIAL TESTS:  Negative slump test   FUNCTIONAL TESTS:  Able to STS without hands but it's a struggle Able to bend over (straight knees ) to pick up something from the ground   GAIT: Has a SPC, without cane walks with increased base of support and signs of bil glute weakness  TREATMENT DATE:  [facility] Adult PT Treatment:                                                DATE: 02/06/24 Pt seen for aquatic therapy today.  Treatment took place in water 3.5-4.75 ft in depth at the [facility] pool. Temp of water was 91.  Pt entered/exited the pool via stairs independently with bilat rail.  - unsupported walking forward/ backward multiple laps, with reciprocal arm swing - side stepping with arm addct/ abdct -> with rainbow hand floats (cues for sequence and posture)-> without hand floats (improved tolerance) - marching forward/ backward with reciprocal row motion  - UE on wall: hip add/abd x 10 ; leg swings into hip flexion /extension x 10; single leg clams x 10 each LE (cues to reduce height of leg; heel raises x 10  - return to walking forward/ backward  - TrA set with alternating bilat rainbow hand float pull down to thighs x 3 -> with knee taps  - forward walking kicks (hip flex to LAQ) x 3 laps - SLS x 15each (easy) - TrA set with hollow long x 10  - staggered stance with kick board row x 10 each LE forward -  straddling noodle without UE support: cycling  Once dried off:  applied reg Rock Tape to both medial and lateral knee joint lines bilaterally, crossing at tibial tuberosity with 15% stretch to decompress tissue and increase proprioception   [facility] Adult PT Treatment:                                                 DATE: 01/29/24 Pt seen for aquatic therapy today.  Treatment took place in water 3.5-4.75 ft in depth at the [facility] pool. Temp of water was 91.  Pt entered/exited the pool via stairs independently with bilat rail.  - Intro to aquatic therapy principles - unsupported walking forward/ backward multiple laps - side stepping with arm addct/ abdct -> with rainbow hand floats (cues for sequence and posture) - marching forward/ backward with reciprocal row motion  - UE on wall: hip add/abd x 10 ; hip flexion /extension x 10; single leg clams x 8 each LE (cues to reduce height of leg  - return to walking forward/ backward  - TrA set with 1/2 hollow noodle -> full hollow noodle -> bilat rainbow hand float pull down to thighs x 10  - straddling noodle without UE support: cycling, hip abdct/ addct with feet suspended - UE on rail at stairs: attempted standing quad stretch (limited range, will use noodle next visit); L/R hamstring stretch x 15s  01/25/24 Nustep L5 x 6 min UEs/LEs Supine  LTR x10  Piriformis stretch x 30"  Figure 4 stretch x 30"  Bridge 2x10  Isometric hip flexion 2x5x5" Sidelying  Clamshell 2x10  Hip abd x10 Counter plank 2x20" Counter side plank 2x20" Standing glute med iso 2x5x5"   01/18/24                                                                                                                              Eval  Benefit of aquatic PT for properties of water on joint pain Aquatic PT handouts Initiation of HEP   PATIENT EDUCATION:  Education details: aquatic therapy progressions/ modifications  Person educated: Patient Education method: Explanation Education comprehension: verbalized understanding  HOME EXERCISE PROGRAM: [place] Access Code: [email or web address] URL: [email or web address] Date: 01/18/2024  ASSESSMENT:  CLINICAL IMPRESSION: Pt reported continued pain reported in bilat knees with exercise in water with  knee flexion, but no worse. Some "twinges" in back with TrA set and noodle pull down.  Overall good tolerance to session.  Trial of kinesiology tape applied to bilat knees.  Goals are ongoing. Pt was issued list of area pools.      From eval: Patient is a 57 y.o. female who was seen today for physical therapy evaluation and treatment for left sided back pain and left sciatica. No current symptoms below left lateral hip.  No neural signs.  Chronic  knee pain with limited ROM may have contributed to recent exacerbation of left back and hip pain.  The patient would benefit from PT to address trunk and hip range of motion deficits, strength asymmetries in lumbo/pelvic and hip regions and pain levels that are currently affecting activities of daily living including standing for cooking, sleeping, walking in larger stores like [employer], and doing housework like cleaning the tub.  OBJECTIVE IMPAIRMENTS: decreased activity tolerance, difficulty walking, decreased ROM, decreased strength, impaired perceived functional ability, and pain.   ACTIVITY LIMITATIONS: lifting, bending, standing, squatting, sleeping, and locomotion level  PARTICIPATION LIMITATIONS: meal prep, cleaning, shopping, and community activity  PERSONAL FACTORS: Time since onset of injury/illness/exacerbation and 1-2 comorbidities: HTN, multi body regions affected  are also affecting patient's functional outcome.   REHAB POTENTIAL: Good  CLINICAL DECISION MAKING: Stable/uncomplicated  EVALUATION COMPLEXITY: Low   GOALS: Goals reviewed with patient? Yes  SHORT TERM GOALS: Target date: 02/15/2024   The patient will demonstrate knowledge of basic self care strategies and exercises to promote healing  Baseline: Goal status: INITIAL  2.  The patient will report a 30% improvement in pain levels with functional activities which are currently difficult including sleeping, cooking and housework Baseline:  Goal status: INITIAL  3.  The  patient will have improved hip strength to at least 4/5 needed for standing, walking longer distances and descending stairs at home  Baseline:  Goal status: INITIAL      LONG TERM GOALS: Target date: 03/14/2024    The patient will be independent in a safe self progression of a home exercise program to promote further recovery of function  Baseline:  Goal status: INITIAL  2.  The patient will have improved trunk flexor and extensor muscle strength to at least 4+/5 needed for housework and carrying grocery bags Baseline:  Goal status: INITIAL  3.  Patient will have LE strength and stamina to be able to walk around [employer] pushing the shopping cart Baseline:  Goal status: INITIAL  4.  The patient will report a 60% improvement in left back and hip pain levels with functional activities which are currently including sleeping, cooking and housework Baseline:  Goal status: INITIAL  5.  Modified Oswestry Index improved to   14%  indicating improved function with less pain Baseline:  Goal status: INITIAL   PLAN:  PT FREQUENCY: 2x/week  PT DURATION: 8 weeks  PLANNED INTERVENTIONS: 97164- PT Re-evaluation, 97110-Therapeutic exercises, 97530- Therapeutic activity, 97112- Neuromuscular re-education, 97535- Self Care, 16109- Manual therapy, [id number]- Aquatic Therapy, U0454- Electrical stimulation (unattended), [id number]- Electrical stimulation (manual), [id number]- Ultrasound, [id number]- Traction (mechanical), [id number]- Ionotophoresis 4mg /ml Dexamethasone, Patient/Family education, Stair training, Taping, Dry Needling, Joint mobilization, Spinal manipulation, Spinal mobilization, Cryotherapy, and Moist heat.  PLAN FOR NEXT SESSION: review initial HEP and progress as appropriate targeting core and hip strengthening; aquatic PT secondary to chronic knee issues and back issues sensitive to load; Nu-Step or bike   Mayer Camel, [place] 02/06/24 12:27 PM [facility] [address] [place], [place], [address] Phone: [phone]   Fax:  [phone]

## 2024-02-08 ENCOUNTER — Ambulatory Visit: Attending: Family Medicine

## 2024-02-08 DIAGNOSIS — R262 Difficulty in walking, not elsewhere classified: Secondary | ICD-10-CM

## 2024-02-08 DIAGNOSIS — M170 Bilateral primary osteoarthritis of knee: Secondary | ICD-10-CM | POA: Insufficient documentation

## 2024-02-08 DIAGNOSIS — M62838 Other muscle spasm: Secondary | ICD-10-CM | POA: Diagnosis present

## 2024-02-08 DIAGNOSIS — M25562 Pain in left knee: Secondary | ICD-10-CM | POA: Diagnosis present

## 2024-02-08 DIAGNOSIS — R293 Abnormal posture: Secondary | ICD-10-CM | POA: Diagnosis present

## 2024-02-08 DIAGNOSIS — M5459 Other low back pain: Secondary | ICD-10-CM | POA: Insufficient documentation

## 2024-02-08 DIAGNOSIS — M6281 Muscle weakness (generalized): Secondary | ICD-10-CM

## 2024-02-08 DIAGNOSIS — R252 Cramp and spasm: Secondary | ICD-10-CM | POA: Diagnosis present

## 2024-02-08 DIAGNOSIS — M79604 Pain in right leg: Secondary | ICD-10-CM | POA: Insufficient documentation

## 2024-02-08 DIAGNOSIS — G8929 Other chronic pain: Secondary | ICD-10-CM | POA: Diagnosis present

## 2024-02-08 DIAGNOSIS — M5442 Lumbago with sciatica, left side: Secondary | ICD-10-CM | POA: Diagnosis present

## 2024-02-08 NOTE — Therapy (Signed)
 OUTPATIENT PHYSICAL THERAPY THORACOLUMBAR TREATMENT   Patient Name: Linda Cunningham MRN: [id number] DOB:05-19-1966, 58 y.o., female Today's Date: 02/08/2024  END OF SESSION:  PT End of Session - 02/08/24 1413     Visit Number 6    Date for PT Re-Evaluation 03/14/24    Authorization Type [employer]    PT Start Time 1406    PT Stop Time 1454    PT Time Calculation (min) 48 min    Activity Tolerance Patient tolerated treatment well    Behavior During Therapy [facility] for tasks assessed/performed              Past Medical History:  Diagnosis Date   Abnormal nuclear stress test 09/25/2015   Back pain    radiating to leg on L side   Chest pain 01/05/2015   Epigastric pain    Essential hypertension, benign 12/11/2013   Hypertension    Migraine    OSA on CPAP    Followed by Dr. Catalina Lunger with [facility]   Pain of upper extremity 01/07/2020   Pseudotumor cerebri    SOB (shortness of breath) 09/16/2016   Past Surgical History:  Procedure Laterality Date   CARDIAC CATHETERIZATION N/A 09/25/2015   Procedure: Left Heart Cath and Coronary Angiography;  Surgeon: Peter M [place], MD;  Location: [facility] INVASIVE CV LAB;  Service: Cardiovascular;  Laterality: N/A;   CESAREAN SECTION     Patient Active Problem List   Diagnosis Date Noted   Intractable migraine without aura and without status migrainosus 02/04/2024   IIH (idiopathic intracranial hypertension) 02/04/2024   Pain of upper extremity 01/07/2020   SOB (shortness of breath) 09/16/2016   OSA on CPAP    Abnormal nuclear stress test 09/25/2015   Chest pain 01/05/2015   Essential hypertension, benign 12/11/2013    PCP: Delbert Harness MD  REFERRING PROVIDER: Delbert Harness MD  REFERRING DIAG: M54.42 left sided low back pain with left sided sciatica  Rationale for Evaluation and Treatment: Rehabilitation  THERAPY DIAG:  Left low back pain with left-sided sciatica; weakness  ONSET DATE: 3 weeks ago  SUBJECTIVE:                                                                                                                                                                                            SUBJECTIVE STATEMENT: Pt states she continues to have pain in the knees and back.  She feels she is maybe 10% better.    POOL ACCESS:  her apt complex has outdoor pool.  May look into going to [facility] for additional pool access.   PERTINENT HISTORY:  HTN;  chronic knee issues; chronic back issues on/off over the years;  had sciatica during pregnancy Had water ex many years ago Had PT at [facility] last April, May, June for back and leg weakness   PAIN:  02/08/24 Are you having pain? Yes NPRS scale: 6-7/10 back; 6/10 bilat knees Pain location: see above Pain orientation:  PAIN TYPE: aching  Pain description: intermittent  Aggravating factors: cleaning the tub, lying down/sleep Unsure how walking is Relieving factors: sitting OK    PRECAUTIONS: None   WEIGHT BEARING RESTRICTIONS: No  FALLS:  Has patient fallen in last 6 months? No  LIVING ENVIRONMENT: Lives with: boyfriend Lives in: House/apartment lives on 2nd floor    OCCUPATION: none  PLOF: Independent  PATIENT GOALS: go back to normal; go to the store (avoiding b/c it takes 2 1/2 hours [employer])  my knees limit me; I'd like to start exercising (complex has a small compact-TM, Elliptical hurts)  OBJECTIVE:  Note: Objective measures were completed at Evaluation unless otherwise noted.  DIAGNOSTIC FINDINGS:  None recently  PATIENT SURVEYS:   Initial eval: Modified Oswestry 20%    02/08/24:  Modified Oswestry 11/50= 22%  COGNITION: Overall cognitive status: Within functional limits for tasks assessed      POSTURE: pelvic drop with attempted SLS on right and left 3-4 sec   PALPATION: No tender points  LUMBAR ROM:   AROM eval  Flexion Fingers to ankles mild pain  Extension 20  Right lateral flexion 20  Left lateral flexion 20  Right  rotation   Left rotation    (Blank rows = not tested)  TRUNK STRENGTH:  Decreased activation of transverse abdominus muscles; abdominals 4-/5; decreased activation of lumbar multifidi; trunk extensors 4-/5  LOWER EXTREMITY ROM:  bil hip flexion 90 degrees; full knee extension, bil knee flexion 90 degrees  LOWER EXTREMITY MMT:  left hip abduction 3+/5, right hip abduction 4-/5; hip extension 4-/5, quads 4-/5  LUMBAR SPECIAL TESTS:  Negative slump test   FUNCTIONAL TESTS:  Able to STS without hands but it's a struggle Able to bend over (straight knees ) to pick up something from the ground   GAIT: Has a SPC, without cane walks with increased base of support and signs of bil glute weakness  TREATMENT DATE:  02/08/24: Discussed progress and tolerance to pool Repeated ODI 11/50= 22% Nustep x 5 min level 3 Supine quad sets x 20 Supine clam x 20 with yellow loop Sidelying clam 2 x 10 each LE with yellow loop Hook lying bridge 2 x 10 Supine SLR 2 x 10 each LE Side lying hip abduction 2 x 10 each LE Prone hip extension 2 x 10 each LE   [facility] Adult PT Treatment:                                                DATE: 02/06/24 Pt seen for aquatic therapy today.  Treatment took place in water 3.5-4.75 ft in depth at the [facility] pool. Temp of water was 91.  Pt entered/exited the pool via stairs independently with bilat rail.  - unsupported walking forward/ backward multiple laps, with reciprocal arm swing - side stepping with arm addct/ abdct -> with rainbow hand floats (cues for sequence and posture)-> without hand floats (improved tolerance) - marching forward/ backward with reciprocal row motion  - UE on wall: hip  add/abd x 10 ; leg swings into hip flexion /extension x 10; single leg clams x 10 each LE (cues to reduce height of leg; heel raises x 10  - return to walking forward/ backward  - TrA set with alternating bilat rainbow hand float pull down to thighs x 3 -> with knee  taps  - forward walking kicks (hip flex to LAQ) x 3 laps - SLS x 15each (easy) - TrA set with hollow long x 10  - staggered stance with kick board row x 10 each LE forward - straddling noodle without UE support: cycling  Once dried off:  applied reg Rock Tape to both medial and lateral knee joint lines bilaterally, crossing at tibial tuberosity with 15% stretch to decompress tissue and increase proprioception   [facility] Adult PT Treatment:                                                DATE: 01/29/24 Pt seen for aquatic therapy today.  Treatment took place in water 3.5-4.75 ft in depth at the [facility] pool. Temp of water was 91.  Pt entered/exited the pool via stairs independently with bilat rail.  - Intro to aquatic therapy principles - unsupported walking forward/ backward multiple laps - side stepping with arm addct/ abdct -> with rainbow hand floats (cues for sequence and posture) - marching forward/ backward with reciprocal row motion  - UE on wall: hip add/abd x 10 ; hip flexion /extension x 10; single leg clams x 8 each LE (cues to reduce height of leg  - return to walking forward/ backward  - TrA set with 1/2 hollow noodle -> full hollow noodle -> bilat rainbow hand float pull down to thighs x 10  - straddling noodle without UE support: cycling, hip abdct/ addct with feet suspended - UE on rail at stairs: attempted standing quad stretch (limited range, will use noodle next visit); L/R hamstring stretch x 15s  01/25/24 Nustep L5 x 6 min UEs/LEs Supine  LTR x10  Piriformis stretch x 30"  Figure 4 stretch x 30"  Bridge 2x10  Isometric hip flexion 2x5x5" Sidelying  Clamshell 2x10  Hip abd x10 Counter plank 2x20" Counter side plank 2x20" Standing glute med iso 2x5x5"   01/18/24                                                                                                                              Eval  Benefit of aquatic PT for properties of water on joint  pain Aquatic PT handouts Initiation of HEP   PATIENT EDUCATION:  Education details: aquatic therapy progressions/ modifications  Person educated: Patient Education method: Explanation Education comprehension: verbalized understanding  HOME EXERCISE PROGRAM: [place] Access Code: [email or web address] URL: [email or web address] Date: 01/18/2024  ASSESSMENT:  CLINICAL IMPRESSION: Patient reports about 10%  improvement.  Oswestry score is 1 point worsened.  She is progressing very slowly due to complication of knee issues.  Her back issues are likely exacerbated by her trendelenburg gait.  She is extremely weak in proximal hip musculature.  SLR's are very challenging as well.  She has a slight heel lag of approx 10 degrees.   Patient would benefit from continuing skilled PT for the remainder of her cert period.  She was encouraged to seek out local pool program and have that established before discharge.  Also encouraged patient to be as diligent as possible with her HEP.     From eval: Patient is a 58 y.o. female who was seen today for physical therapy evaluation and treatment for left sided back pain and left sciatica. No current symptoms below left lateral hip.  No neural signs.  Chronic knee pain with limited ROM may have contributed to recent exacerbation of left back and hip pain.  The patient would benefit from PT to address trunk and hip range of motion deficits, strength asymmetries in lumbo/pelvic and hip regions and pain levels that are currently affecting activities of daily living including standing for cooking, sleeping, walking in larger stores like [employer], and doing housework like cleaning the tub.  OBJECTIVE IMPAIRMENTS: decreased activity tolerance, difficulty walking, decreased ROM, decreased strength, impaired perceived functional ability, and pain.   ACTIVITY LIMITATIONS: lifting, bending, standing, squatting, sleeping, and locomotion level  PARTICIPATION LIMITATIONS: meal  prep, cleaning, shopping, and community activity  PERSONAL FACTORS: Time since onset of injury/illness/exacerbation and 1-2 comorbidities: HTN, multi body regions affected  are also affecting patient's functional outcome.   REHAB POTENTIAL: Good  CLINICAL DECISION MAKING: Stable/uncomplicated  EVALUATION COMPLEXITY: Low   GOALS: Goals reviewed with patient? Yes  SHORT TERM GOALS: Target date: 02/15/2024   The patient will demonstrate knowledge of basic self care strategies and exercises to promote healing  Baseline: Goal status: In progress  2.  The patient will report a 30% improvement in pain levels with functional activities which are currently difficult including sleeping, cooking and housework Baseline: pt reports 10% on 02/08/24 Goal status: In progress  3.  The patient will have improved hip strength to at least 4/5 needed for standing, walking longer distances and descending stairs at home  Baseline:  Goal status: In progress      LONG TERM GOALS: Target date: 03/14/2024    The patient will be independent in a safe self progression of a home exercise program to promote further recovery of function  Baseline:  Goal status: INITIAL  2.  The patient will have improved trunk flexor and extensor muscle strength to at least 4+/5 needed for housework and carrying grocery bags Baseline:  Goal status: INITIAL  3.  Patient will have LE strength and stamina to be able to walk around [employer] pushing the shopping cart Baseline:  Goal status: INITIAL  4.  The patient will report a 60% improvement in left back and hip pain levels with functional activities which are currently including sleeping, cooking and housework Baseline:  Goal status: INITIAL  5.  Modified Oswestry Index improved to   14%  indicating improved function with less pain Baseline:  Goal status: INITIAL   PLAN:  PT FREQUENCY: 2x/week  PT DURATION: 8 weeks  PLANNED INTERVENTIONS: 97164- PT  Re-evaluation, 97110-Therapeutic exercises, 97530- Therapeutic activity, [id number]- Neuromuscular re-education, 97535- Self Care, 16109- Manual therapy, [id number]- Aquatic Therapy, U0454- Electrical stimulation (unattended), [id number]- Electrical stimulation (manual), [id number]- Ultrasound, 09811- Traction (mechanical),  16109- Ionotophoresis 4mg /ml Dexamethasone, Patient/Family education, Stair training, Taping, Dry Needling, Joint mobilization, Spinal manipulation, Spinal mobilization, Cryotherapy, and Moist heat.  PLAN FOR NEXT SESSION: Continue aquatics program as well as land therapy, emphasize compliance with HEP.  Assess patient compliance with HEP. Review HEP and progress as appropriate targeting core and hip strengthening; aquatic PT secondary to chronic knee issues and back issues sensitive to load; Nu-Step or bike   [place] B. Kimerly Rowand, PT 02/08/24 3:55 PM [facility] [address], [address] [place], [place] [address] Phone # [phone] Fax [phone]

## 2024-02-12 ENCOUNTER — Encounter (HOSPITAL_BASED_OUTPATIENT_CLINIC_OR_DEPARTMENT_OTHER): Payer: Self-pay | Admitting: Physical Therapy

## 2024-02-12 ENCOUNTER — Ambulatory Visit (HOSPITAL_BASED_OUTPATIENT_CLINIC_OR_DEPARTMENT_OTHER): Admitting: Physical Therapy

## 2024-02-12 DIAGNOSIS — M5442 Lumbago with sciatica, left side: Secondary | ICD-10-CM

## 2024-02-12 DIAGNOSIS — R293 Abnormal posture: Secondary | ICD-10-CM

## 2024-02-12 DIAGNOSIS — M6281 Muscle weakness (generalized): Secondary | ICD-10-CM

## 2024-02-12 DIAGNOSIS — R252 Cramp and spasm: Secondary | ICD-10-CM

## 2024-02-12 DIAGNOSIS — R262 Difficulty in walking, not elsewhere classified: Secondary | ICD-10-CM

## 2024-02-12 NOTE — Therapy (Signed)
 OUTPATIENT PHYSICAL THERAPY THORACOLUMBAR TREATMENT   Patient Name: Linda Cunningham MRN: [id number] DOB:01/14/1966, 58 y.o., female Today's Date: 02/12/2024  END OF SESSION:  PT End of Session - 02/12/24 1158     Visit Number 7    Date for PT Re-Evaluation 03/14/24    Authorization Type [employer]    PT Start Time 1150    PT Stop Time 1229    PT Time Calculation (min) 39 min    Behavior During Therapy [facility] for tasks assessed/performed              Past Medical History:  Diagnosis Date   Abnormal nuclear stress test 09/25/2015   Back pain    radiating to leg on L side   Chest pain 01/05/2015   Epigastric pain    Essential hypertension, benign 12/11/2013   Hypertension    Migraine    OSA on CPAP    Followed by Dr. Catalina Lunger with [facility]   Pain of upper extremity 01/07/2020   Pseudotumor cerebri    SOB (shortness of breath) 09/16/2016   Past Surgical History:  Procedure Laterality Date   CARDIAC CATHETERIZATION N/A 09/25/2015   Procedure: Left Heart Cath and Coronary Angiography;  Surgeon: Peter M Swaziland, MD;  Location: [facility] INVASIVE CV LAB;  Service: Cardiovascular;  Laterality: N/A;   CESAREAN SECTION     Patient Active Problem List   Diagnosis Date Noted   Intractable migraine without aura and without status migrainosus 02/04/2024   IIH (idiopathic intracranial hypertension) 02/04/2024   Pain of upper extremity 01/07/2020   SOB (shortness of breath) 09/16/2016   OSA on CPAP    Abnormal nuclear stress test 09/25/2015   Chest pain 01/05/2015   Essential hypertension, benign 12/11/2013    PCP: Delbert Harness MD  REFERRING PROVIDER: Delbert Harness MD  REFERRING DIAG: M54.42 left sided low back pain with left sided sciatica  Rationale for Evaluation and Treatment: Rehabilitation  THERAPY DIAG:  Left low back pain with left-sided sciatica; weakness  ONSET DATE: 3 weeks ago  SUBJECTIVE:                                                                                                                                                                                            SUBJECTIVE STATEMENT: Pt states that her knees have been bothering her more.  She leaves for trip to [place] on Saturday.     POOL ACCESS:  her apt complex has outdoor pool.  May look into going to [facility] for additional pool access.   PERTINENT HISTORY:  HTN; chronic knee issues; chronic back issues on/off over the  years;  had sciatica during pregnancy Had water ex many years ago Had PT at [facility] last April, May, June for back and leg weakness   PAIN:  02/08/24 Are you having pain? Yes NPRS scale: 2/10 back; 7/10 R knee; 2/10 L knee  Pain location: see above Pain orientation:  PAIN TYPE: aching  Pain description: intermittent  Aggravating factors: cleaning the tub, lying down/sleep Unsure how walking is Relieving factors: sitting OK    PRECAUTIONS: None   WEIGHT BEARING RESTRICTIONS: No  FALLS:  Has patient fallen in last 6 months? No  LIVING ENVIRONMENT: Lives with: boyfriend Lives in: House/apartment lives on 2nd floor    OCCUPATION: none  PLOF: Independent  PATIENT GOALS: go back to normal; go to the store (avoiding b/c it takes 2 1/2 hours [employer])  my knees limit me; I'd like to start exercising (complex has a small compact-TM, Elliptical hurts)  OBJECTIVE:  Note: Objective measures were completed at Evaluation unless otherwise noted.  DIAGNOSTIC FINDINGS:  None recently  PATIENT SURVEYS:   Initial eval: Modified Oswestry 20%    02/08/24:  Modified Oswestry 11/50= 22%  COGNITION: Overall cognitive status: Within functional limits for tasks assessed      POSTURE: pelvic drop with attempted SLS on right and left 3-4 sec   PALPATION: No tender points  LUMBAR ROM:   AROM eval  Flexion Fingers to ankles mild pain  Extension 20  Right lateral flexion 20  Left lateral flexion 20  Right rotation   Left rotation    (Blank rows = not  tested)  TRUNK STRENGTH:  Decreased activation of transverse abdominus muscles; abdominals 4-/5; decreased activation of lumbar multifidi; trunk extensors 4-/5  LOWER EXTREMITY ROM:  bil hip flexion 90 degrees; full knee extension, bil knee flexion 90 degrees  LOWER EXTREMITY MMT:  left hip abduction 3+/5, right hip abduction 4-/5; hip extension 4-/5, quads 4-/5  LUMBAR SPECIAL TESTS:  Negative slump test   FUNCTIONAL TESTS:  Able to STS without hands but it's a struggle Able to bend over (straight knees ) to pick up something from the ground   GAIT: Has a SPC, without cane walks with increased base of support and signs of bil glute weakness  TREATMENT DATE:  [facility] Adult PT Treatment:                                                DATE: 02/12/24 Pt seen for aquatic therapy today.  Treatment took place in water 3.5-4.75 ft in depth at the [facility] pool. Temp of water was 91.  Pt entered/exited the pool via stairs in step-to pattern independently with bilat rail.  - unsupported walking forward/ backward multiple laps, with reciprocal arm swing - side stepping with arm addct/ abdct -> with rainbow hand floats (cues for upright posture) - farmer carry with single yellow hand float at side, walking forward / backward - TrA set with long hollow pull to front of thighs x 10 each LE in staggered stance - UE on wall: hip add/abd 2 x 10 ; leg swings into hip flexion /extension x 10;  heel/toe raises x 10  - marching forward/ backward  (limits amount of bend due to discomfort)  - forward walking kicks (hip flex to LAQ) x 1 laps - straddling noodle without UE support: cycling  Once dried off:  applied  reg Rock Tape to both medial and lateral knee joint lines bilaterally, crossing at tibial tuberosity with 15% stretch to decompress tissue and increase proprioception  02/08/24: Discussed progress and tolerance to pool Repeated ODI 11/50= 22% Nustep x 5 min level 3 Supine quad sets x  20 Supine clam x 20 with yellow loop Sidelying clam 2 x 10 each LE with yellow loop Hook lying bridge 2 x 10 Supine SLR 2 x 10 each LE Side lying hip abduction 2 x 10 each LE Prone hip extension 2 x 10 each LE   [facility] Adult PT Treatment:                                                DATE: 02/06/24 Pt seen for aquatic therapy today.  Treatment took place in water 3.5-4.75 ft in depth at the [facility] pool. Temp of water was 91.  Pt entered/exited the pool via stairs independently with bilat rail.  - unsupported walking forward/ backward multiple laps, with reciprocal arm swing - side stepping with arm addct/ abdct -> with rainbow hand floats (cues for sequence and posture)-> without hand floats (improved tolerance) - marching forward/ backward with reciprocal row motion  - UE on wall: hip add/abd x 10 ; leg swings into hip flexion /extension x 10; single leg clams x 10 each LE (cues to reduce height of leg; heel raises x 10  - return to walking forward/ backward  - TrA set with alternating bilat rainbow hand float pull down to thighs x 3 -> with knee taps  - forward walking kicks (hip flex to LAQ) x 3 laps - SLS x 15each (easy) - TrA set with hollow long x 10  - staggered stance with kick board row x 10 each LE forward - straddling noodle without UE support: cycling  Once dried off:  applied reg Rock Tape to both medial and lateral knee joint lines bilaterally, crossing at tibial tuberosity with 15% stretch to decompress tissue and increase proprioception   [facility] Adult PT Treatment:                                                DATE: 01/29/24 Pt seen for aquatic therapy today.  Treatment took place in water 3.5-4.75 ft in depth at the [facility] pool. Temp of water was 91.  Pt entered/exited the pool via stairs independently with bilat rail.  - Intro to aquatic therapy principles - unsupported walking forward/ backward multiple laps - side stepping with arm addct/  abdct -> with rainbow hand floats (cues for sequence and posture) - marching forward/ backward with reciprocal row motion  - UE on wall: hip add/abd x 10 ; hip flexion /extension x 10; single leg clams x 8 each LE (cues to reduce height of leg  - return to walking forward/ backward  - TrA set with 1/2 hollow noodle -> full hollow noodle -> bilat rainbow hand float pull down to thighs x 10  - straddling noodle without UE support: cycling, hip abdct/ addct with feet suspended - UE on rail at stairs: attempted standing quad stretch (limited range, will use noodle next visit); L/R hamstring stretch x 15s  01/25/24 Nustep L5 x 6  min UEs/LEs Supine  LTR x10  Piriformis stretch x 30"  Figure 4 stretch x 30"  Bridge 2x10  Isometric hip flexion 2x5x5" Sidelying  Clamshell 2x10  Hip abd x10 Counter plank 2x20" Counter side plank 2x20" Standing glute med iso 2x5x5"   01/18/24                                                                                                                              Eval  Benefit of aquatic PT for properties of water on joint pain Aquatic PT handouts Initiation of HEP   PATIENT EDUCATION:  Education details: aquatic therapy progressions/ modifications  Person educated: Patient Education method: Explanation Education comprehension: verbalized understanding  HOME EXERCISE PROGRAM: [place] Access Code: [email or web address] URL: [email or web address] Date: 01/18/2024  ASSESSMENT:  CLINICAL IMPRESSION: Pt reports increase in bilat knee pain when allowing increased knee flexion with marching.  R knee slightly better while exercising in water.  Limited change in back symptoms. Pt requires occasional cues for more upright posture.  Pt to look into pool facilities.  Pt is gradually progressing towards goals.    From eval: Patient is a 58 y.o. female who was seen today for physical therapy evaluation and treatment for left sided back pain and left sciatica. No  current symptoms below left lateral hip.  No neural signs.  Chronic knee pain with limited ROM may have contributed to recent exacerbation of left back and hip pain.  The patient would benefit from PT to address trunk and hip range of motion deficits, strength asymmetries in lumbo/pelvic and hip regions and pain levels that are currently affecting activities of daily living including standing for cooking, sleeping, walking in larger stores like [employer], and doing housework like cleaning the tub.  OBJECTIVE IMPAIRMENTS: decreased activity tolerance, difficulty walking, decreased ROM, decreased strength, impaired perceived functional ability, and pain.   ACTIVITY LIMITATIONS: lifting, bending, standing, squatting, sleeping, and locomotion level  PARTICIPATION LIMITATIONS: meal prep, cleaning, shopping, and community activity  PERSONAL FACTORS: Time since onset of injury/illness/exacerbation and 1-2 comorbidities: HTN, multi body regions affected  are also affecting patient's functional outcome.   REHAB POTENTIAL: Good  CLINICAL DECISION MAKING: Stable/uncomplicated  EVALUATION COMPLEXITY: Low   GOALS: Goals reviewed with patient? Yes  SHORT TERM GOALS: Target date: 02/15/2024   The patient will demonstrate knowledge of basic self care strategies and exercises to promote healing  Baseline: Goal status: In progress  2.  The patient will report a 30% improvement in pain levels with functional activities which are currently difficult including sleeping, cooking and housework Baseline: pt reports 10% on 02/08/24 Goal status: In progress  3.  The patient will have improved hip strength to at least 4/5 needed for standing, walking longer distances and descending stairs at home  Baseline:  Goal status: In progress      LONG TERM GOALS: Target date: 03/14/2024    The patient will be independent in a safe self progression  of a home exercise program to promote further recovery of function   Baseline:  Goal status: INITIAL  2.  The patient will have improved trunk flexor and extensor muscle strength to at least 4+/5 needed for housework and carrying grocery bags Baseline:  Goal status: INITIAL  3.  Patient will have LE strength and stamina to be able to walk around [employer] pushing the shopping cart Baseline:  Goal status: INITIAL  4.  The patient will report a 60% improvement in left back and hip pain levels with functional activities which are currently including sleeping, cooking and housework Baseline:  Goal status: INITIAL  5.  Modified Oswestry Index improved to   14%  indicating improved function with less pain Baseline:  Goal status: INITIAL   PLAN:  PT FREQUENCY: 2x/week  PT DURATION: 8 weeks  PLANNED INTERVENTIONS: 97164- PT Re-evaluation, 97110-Therapeutic exercises, 97530- Therapeutic activity, 97112- Neuromuscular re-education, 97535- Self Care, [id number]- Manual therapy, [id number]- Aquatic Therapy, [id number]- Electrical stimulation (unattended), [id number]- Electrical stimulation (manual), [id number]- Ultrasound, [id number]- Traction (mechanical), [id number]- Ionotophoresis 4mg /ml Dexamethasone, Patient/Family education, Stair training, Taping, Dry Needling, Joint mobilization, Spinal manipulation, Spinal mobilization, Cryotherapy, and Moist heat.  PLAN FOR NEXT SESSION: Continue aquatics program as well as land therapy, emphasize compliance with HEP.  Assess patient compliance with HEP. Review HEP and progress as appropriate targeting core and hip strengthening; aquatic PT secondary to chronic knee issues and back issues sensitive to load; Nu-Step or bike   Mayer Camel, [place] 02/12/24 12:35 PM [place] [facility] [address] [place], [place], [address] Phone: [phone]   Fax:  [phone]

## 2024-02-15 ENCOUNTER — Ambulatory Visit

## 2024-02-15 DIAGNOSIS — M170 Bilateral primary osteoarthritis of knee: Secondary | ICD-10-CM

## 2024-02-15 DIAGNOSIS — R293 Abnormal posture: Secondary | ICD-10-CM

## 2024-02-15 DIAGNOSIS — M5442 Lumbago with sciatica, left side: Secondary | ICD-10-CM

## 2024-02-15 DIAGNOSIS — R262 Difficulty in walking, not elsewhere classified: Secondary | ICD-10-CM

## 2024-02-15 DIAGNOSIS — G8929 Other chronic pain: Secondary | ICD-10-CM

## 2024-02-15 DIAGNOSIS — R252 Cramp and spasm: Secondary | ICD-10-CM

## 2024-02-15 DIAGNOSIS — M6281 Muscle weakness (generalized): Secondary | ICD-10-CM

## 2024-02-15 DIAGNOSIS — M79604 Pain in right leg: Secondary | ICD-10-CM

## 2024-02-15 NOTE — Therapy (Signed)
 OUTPATIENT PHYSICAL THERAPY THORACOLUMBAR TREATMENT   Patient Name: Linda Cunningham MRN: [id number] DOB:10-01-1966, 58 y.o., female Today's Date: 02/15/2024  END OF SESSION:  PT End of Session - 02/15/24 1239     Visit Number 8    Date for PT Re-Evaluation 03/14/24    Authorization Type [employer]    PT Start Time 1230    PT Stop Time 1315    PT Time Calculation (min) 45 min    Activity Tolerance Patient tolerated treatment well    Behavior During Therapy [facility] for tasks assessed/performed              Past Medical History:  Diagnosis Date   Abnormal nuclear stress test 09/25/2015   Back pain    radiating to leg on L side   Chest pain 01/05/2015   Epigastric pain    Essential hypertension, benign 12/11/2013   Hypertension    Migraine    OSA on CPAP    Followed by Dr. Catalina Lunger with [facility]   Pain of upper extremity 01/07/2020   Pseudotumor cerebri    SOB (shortness of breath) 09/16/2016   Past Surgical History:  Procedure Laterality Date   CARDIAC CATHETERIZATION N/A 09/25/2015   Procedure: Left Heart Cath and Coronary Angiography;  Surgeon: Peter M Swazil[place], MD;  Location: [facility] INVASIVE CV LAB;  Service: Cardiovascular;  Laterality: N/A;   CESAREAN SECTION     Patient Active Problem List   Diagnosis Date Noted   Intractable migraine without aura and without status migrainosus 02/04/2024   IIH (idiopathic intracranial hypertension) 02/04/2024   Pain of upper extremity 01/07/2020   SOB (shortness of breath) 09/16/2016   OSA on CPAP    Abnormal nuclear stress test 09/25/2015   Chest pain 01/05/2015   Essential hypertension, benign 12/11/2013    PCP: Delbert Harness MD  REFERRING PROVIDER: Delbert Harness MD  REFERRING DIAG: M54.42 left sided low back pain with left sided sciatica  Rationale for Evaluation and Treatment: Rehabilitation  THERAPY DIAG:  Left low back pain with left-sided sciatica; weakness  ONSET DATE: 3 weeks ago  SUBJECTIVE:                                                                                                                                                                                            SUBJECTIVE STATEMENT: Pt states that her knees are her primary issue.  She explains that the back has been doing well but it's being affected by the knees.    POOL ACCESS:  her apt complex has outdoor pool.  May look into going to [facility] for additional pool access.  PERTINENT HISTORY:  HTN; chronic knee issues; chronic back issues on/off over the years;  had sciatica during pregnancy Had water ex many years ago Had PT at [facility] last April, May, June for back and leg weakness   PAIN:  02/08/24 Are you having pain? Yes NPRS scale: 2/10 back; 7/10 R knee; 2/10 L knee  Pain location: see above Pain orientation:  PAIN TYPE: aching  Pain description: intermittent  Aggravating factors: cleaning the tub, lying down/sleep Unsure how walking is Relieving factors: sitting OK    PRECAUTIONS: None   WEIGHT BEARING RESTRICTIONS: No  FALLS:  Has patient fallen in last 6 months? No  LIVING ENVIRONMENT: Lives with: boyfriend Lives in: House/apartment lives on 2nd floor    OCCUPATION: none  PLOF: Independent  PATIENT GOALS: go back to normal; go to the store (avoiding b/c it takes 2 1/2 hours [employer])  my knees limit me; I'd like to start exercising (complex has a small compact-TM, Elliptical hurts)  OBJECTIVE:  Note: Objective measures were completed at Evaluation unless otherwise noted.  DIAGNOSTIC FINDINGS:  None recently  PATIENT SURVEYS:   Initial eval: Modified Oswestry 20%    02/08/24:  Modified Oswestry 11/50= 22%  COGNITION: Overall cognitive status: Within functional limits for tasks assessed      POSTURE: pelvic drop with attempted SLS on right and left 3-4 sec   PALPATION: No tender points  LUMBAR ROM:   AROM eval  Flexion Fingers to ankles mild pain  Extension 20  Right lateral  flexion 20  Left lateral flexion 20  Right rotation   Left rotation    (Blank rows = not tested)  TRUNK STRENGTH:  Decreased activation of transverse abdominus muscles; abdominals 4-/5; decreased activation of lumbar multifidi; trunk extensors 4-/5  LOWER EXTREMITY ROM:  bil hip flexion 90 degrees; full knee extension, bil knee flexion 90 degrees  LOWER EXTREMITY MMT:  left hip abduction 3+/5, right hip abduction 4-/5; hip extension 4-/5, quads 4-/5  LUMBAR SPECIAL TESTS:  Negative slump test   FUNCTIONAL TESTS:  Able to STS without hands but it's a struggle Able to bend over (straight knees ) to pick up something from the ground   GAIT: Has a SPC, without cane walks with increased base of support and signs of bil glute weakness  TREATMENT DATE:  02/15/24: Nustep x 5 min level 1 (level 1 per patient request) Seated LAQ 2 x 10 each with 2 lb ankle weights Seated march x 20 with 2 lb ankle weights Seated hip ER 2 x 10 with 2 lb ankle weights Supine quad sets x 20 Supine TKE with larger green noodle with 2 lbs 2 x 10 each SAQ with 2 lb 2 x 10 with teal bolster both SLR 2 x 10 with 2 lbs both Supine heel slides 2 x 10 with towel under heel Supine hip abduction 2 x 10 with towel under heel Supine clam x 20 with yellow loop Sidelying clam 2 x 10 each LE with yellow loop Hook lying bridge 2 x 10  [facility] Adult PT Treatment:                                                DATE: 02/12/24 Pt seen for aquatic therapy today.  Treatment took place in water 3.5-4.75 ft in depth at the [facility] pool. Temp of water  was 91.  Pt entered/exited the pool via stairs in step-to pattern independently with bilat rail.  - unsupported walking forward/ backward multiple laps, with reciprocal arm swing - side stepping with arm addct/ abdct -> with rainbow hand floats (cues for upright posture) - farmer carry with single yellow hand float at side, walking forward / backward - TrA set with long  hollow pull to front of thighs x 10 each LE in staggered stance - UE on wall: hip add/abd 2 x 10 ; leg swings into hip flexion /extension x 10;  heel/toe raises x 10  - marching forward/ backward  (limits amount of bend due to discomfort)  - forward walking kicks (hip flex to LAQ) x 1 laps - straddling noodle without UE support: cycling  Once dried off:  applied reg Rock Tape to both medial and lateral knee joint lines bilaterally, crossing at tibial tuberosity with 15% stretch to decompress tissue and increase proprioception  02/08/24: Discussed progress and tolerance to pool Repeated ODI 11/50= 22% Nustep x 5 min level 3 Supine quad sets x 20 Supine clam x 20 with yellow loop Sidelying clam 2 x 10 each LE with yellow loop Hook lying bridge 2 x 10 Supine SLR 2 x 10 each LE Side lying hip abduction 2 x 10 each LE Prone hip extension 2 x 10 each LE   [facility] Adult PT Treatment:                                                DATE: 02/06/24 Pt seen for aquatic therapy today.  Treatment took place in water 3.5-4.75 ft in depth at the [facility] pool. Temp of water was 91.  Pt entered/exited the pool via stairs independently with bilat rail.  - unsupported walking forward/ backward multiple laps, with reciprocal arm swing - side stepping with arm addct/ abdct -> with rainbow hand floats (cues for sequence and posture)-> without hand floats (improved tolerance) - marching forward/ backward with reciprocal row motion  - UE on wall: hip add/abd x 10 ; leg swings into hip flexion /extension x 10; single leg clams x 10 each LE (cues to reduce height of leg; heel raises x 10  - return to walking forward/ backward  - TrA set with alternating bilat rainbow hand float pull down to thighs x 3 -> with knee taps  - forward walking kicks (hip flex to LAQ) x 3 laps - SLS x 15each (easy) - TrA set with hollow long x 10  - staggered stance with kick board row x 10 each LE forward - straddling noodle  without UE support: cycling  Once dried off:  applied reg Rock Tape to both medial and lateral knee joint lines bilaterally, crossing at tibial tuberosity with 15% stretch to decompress tissue and increase proprioception   [facility] Adult PT Treatment:                                                DATE: 01/29/24 Pt seen for aquatic therapy today.  Treatment took place in water 3.5-4.75 ft in depth at the [facility] pool. Temp of water was 91.  Pt entered/exited the pool via stairs independently with bilat rail.  -  Intro to aquatic therapy principles - unsupported walking forward/ backward multiple laps - side stepping with arm addct/ abdct -> with rainbow hand floats (cues for sequence and posture) - marching forward/ backward with reciprocal row motion  - UE on wall: hip add/abd x 10 ; hip flexion /extension x 10; single leg clams x 8 each LE (cues to reduce height of leg  - return to walking forward/ backward  - TrA set with 1/2 hollow noodle -> full hollow noodle -> bilat rainbow hand float pull down to thighs x 10  - straddling noodle without UE support: cycling, hip abdct/ addct with feet suspended - UE on rail at stairs: attempted standing quad stretch (limited range, will use noodle next visit); L/R hamstring stretch x 15s  01/25/24 Nustep L5 x 6 min UEs/LEs Supine  LTR x10  Piriformis stretch x 30"  Figure 4 stretch x 30"  Bridge 2x10  Isometric hip flexion 2x5x5" Sidelying  Clamshell 2x10  Hip abd x10 Counter plank 2x20" Counter side plank 2x20" Standing glute med iso 2x5x5"   01/18/24                                                                                                                              Eval  Benefit of aquatic PT for properties of water on joint pain Aquatic PT handouts Initiation of HEP   PATIENT EDUCATION:  Education details: aquatic therapy progressions/ modifications  Person educated: Patient Education method: Explanation Education  comprehension: verbalized understanding  HOME EXERCISE PROGRAM: [place] Access Code: [email or web address] URL: [email or web address] Date: 01/18/2024  ASSESSMENT:  CLINICAL IMPRESSION: Pt reports knee pain persists and her insurance is not covering the visco supplementation injections.  Her provider has suggested genicular nerve block.  She will be meeting with provider after spring break to see if this is most appropriate course of action.  She was able to do all exercises today with minimal increase in pain.  Her gait is quite compromised and asymmetrical with trendelenburg.  She would benefit from continued skilled PT for quad rehab and hip stability.     From eval: Patient is a 58 y.o. female who was seen today for physical therapy evaluation and treatment for left sided back pain and left sciatica. No current symptoms below left lateral hip.  No neural signs.  Chronic knee pain with limited ROM may have contributed to recent exacerbation of left back and hip pain.  The patient would benefit from PT to address trunk and hip range of motion deficits, strength asymmetries in lumbo/pelvic and hip regions and pain levels that are currently affecting activities of daily living including standing for cooking, sleeping, walking in larger stores like [employer], and doing housework like cleaning the tub.  OBJECTIVE IMPAIRMENTS: decreased activity tolerance, difficulty walking, decreased ROM, decreased strength, impaired perceived functional ability, and pain.   ACTIVITY LIMITATIONS: lifting, bending, standing, squatting, sleeping, and locomotion level  PARTICIPATION LIMITATIONS: meal prep, cleaning, shopping, and  community activity  PERSONAL FACTORS: Time since onset of injury/illness/exacerbation and 1-2 comorbidities: HTN, multi body regions affected  are also affecting patient's functional outcome.   REHAB POTENTIAL: Good  CLINICAL DECISION MAKING: Stable/uncomplicated  EVALUATION COMPLEXITY:  Low   GOALS: Goals reviewed with patient? Yes  SHORT TERM GOALS: Target date: 02/15/2024   The patient will demonstrate knowledge of basic self care strategies and exercises to promote healing  Baseline: Goal status: In progress  2.  The patient will report a 30% improvement in pain levels with functional activities which are currently difficult including sleeping, cooking and housework Baseline: pt reports 10% on 02/08/24 Goal status: In progress  3.  The patient will have improved hip strength to at least 4/5 needed for standing, walking longer distances and descending stairs at home  Baseline:  Goal status: In progress      LONG TERM GOALS: Target date: 03/14/2024    The patient will be independent in a safe self progression of a home exercise program to promote further recovery of function  Baseline:  Goal status: INITIAL  2.  The patient will have improved trunk flexor and extensor muscle strength to at least 4+/5 needed for housework and carrying grocery bags Baseline:  Goal status: INITIAL  3.  Patient will have LE strength and stamina to be able to walk around [employer] pushing the shopping cart Baseline:  Goal status: INITIAL  4.  The patient will report a 60% improvement in left back and hip pain levels with functional activities which are currently including sleeping, cooking and housework Baseline:  Goal status: INITIAL  5.  Modified Oswestry Index improved to   14%  indicating improved function with less pain Baseline:  Goal status: INITIAL   PLAN:  PT FREQUENCY: 2x/week  PT DURATION: 8 weeks  PLANNED INTERVENTIONS: 97164- PT Re-evaluation, 97110-Therapeutic exercises, 97530- Therapeutic activity, 97112- Neuromuscular re-education, 97535- Self Care, 21308- Manual therapy, [id number]- Aquatic Therapy, [id number]- Electrical stimulation (unattended), [id number]- Electrical stimulation (manual), [id number]- Ultrasound, [id number]- Traction (mechanical), [id number]- Ionotophoresis 4mg /ml  Dexamethasone, Patient/Family education, Stair training, Taping, Dry Needling, Joint mobilization, Spinal manipulation, Spinal mobilization, Cryotherapy, and Moist heat.  PLAN FOR NEXT SESSION: Emphasize compliance with HEP. Progress as appropriate targeting core and hip strengthening; aquatic PT secondary to chronic knee issues and back issues sensitive to load; Nu-Step or bike.   Victorino Dike B. Eleanora Guinyard, PT 02/15/24 5:22 PM [facility] [address], [address] [place], [place] [address] Phone # [phone] Fax [phone]

## 2024-02-19 ENCOUNTER — Ambulatory Visit (HOSPITAL_BASED_OUTPATIENT_CLINIC_OR_DEPARTMENT_OTHER): Admitting: Physical Therapy

## 2024-02-22 ENCOUNTER — Encounter

## 2024-02-26 ENCOUNTER — Encounter (HOSPITAL_BASED_OUTPATIENT_CLINIC_OR_DEPARTMENT_OTHER): Payer: Self-pay | Admitting: Physical Therapy

## 2024-02-26 ENCOUNTER — Ambulatory Visit (HOSPITAL_BASED_OUTPATIENT_CLINIC_OR_DEPARTMENT_OTHER): Admitting: Physical Therapy

## 2024-02-26 DIAGNOSIS — M170 Bilateral primary osteoarthritis of knee: Secondary | ICD-10-CM

## 2024-02-26 DIAGNOSIS — G8929 Other chronic pain: Secondary | ICD-10-CM

## 2024-02-26 DIAGNOSIS — M5442 Lumbago with sciatica, left side: Secondary | ICD-10-CM | POA: Diagnosis not present

## 2024-02-26 DIAGNOSIS — M6281 Muscle weakness (generalized): Secondary | ICD-10-CM

## 2024-02-26 NOTE — Therapy (Signed)
 OUTPATIENT PHYSICAL THERAPY THORACOLUMBAR TREATMENT   Patient Name: Linda Cunningham MRN: [id number] DOB:[birth year], 58 y.o., female Today's Date: 02/26/2024  END OF SESSION:  PT End of Session - 02/26/24 1232     Visit Number 9    Date for PT Re-Evaluation 03/14/24    Authorization Type [employer]    PT Start Time 1145    PT Stop Time 1225    PT Time Calculation (min) 40 min    Activity Tolerance Patient tolerated treatment well    Behavior During Therapy [facility] for tasks assessed/performed            Past Medical History:  Diagnosis Date   Abnormal nuclear stress test 09/25/2015   Back pain    radiating to leg on L side   Chest pain 01/05/2015   Epigastric pain    Essential hypertension, benign 12/11/2013   Hypertension    Migraine    OSA on CPAP    Followed by Dr. Gilford Labs with [facility]   Pain of upper extremity 01/07/2020   Pseudotumor cerebri    SOB (shortness of breath) 09/16/2016   Past Surgical History:  Procedure Laterality Date   CARDIAC CATHETERIZATION N/A 09/25/2015   Procedure: Left Heart Cath and Coronary Angiography;  Surgeon: Peter M Swaziland, MD;  Location: [facility] INVASIVE CV LAB;  Service: Cardiovascular;  Laterality: N/A;   CESAREAN SECTION     Patient Active Problem List   Diagnosis Date Noted   Intractable migraine without aura and without status migrainosus 02/04/2024   IIH (idiopathic intracranial hypertension) 02/04/2024   Pain of upper extremity 01/07/2020   SOB (shortness of breath) 09/16/2016   OSA on CPAP    Abnormal nuclear stress test 09/25/2015   Chest pain 01/05/2015   Essential hypertension, benign 12/11/2013    PCP: Maximo Spar MD  REFERRING PROVIDER: Maximo Spar MD  REFERRING DIAG: M54.42 left sided low back pain with left sided sciatica  Rationale for Evaluation and Treatment: Rehabilitation  THERAPY DIAG:  Left low back pain with left-sided sciatica; weakness  ONSET DATE: 3 weeks ago  SUBJECTIVE:                                                                                                                                                                                            SUBJECTIVE STATEMENT: Pt reports the pain in back and knees have been a little worse last week.  Pt reports that her LEs felt more swollen last week.  She is unsure if the aquatic therapy is helping.  The tape helped a little; lasted 3 days, no skin irritation.   POOL  ACCESS:  her apt complex has outdoor pool.  May look into going to [facility] for additional pool access.   PERTINENT HISTORY:  HTN; chronic knee issues; chronic back issues on/off over the years;  had sciatica during pregnancy Had water ex many years ago Had PT at [facility] last April, May, June for back and leg weakness   PAIN:  02/26/24 Are you having pain? Yes NPRS scale: 5-6/10, bilat knees and lower back  Pain location: see above PAIN TYPE: aching  Pain description: intermittent  Aggravating factors: cleaning the tub, lying down/sleep Unsure how walking is Relieving factors: sitting OK    PRECAUTIONS: None   WEIGHT BEARING RESTRICTIONS: No  FALLS:  Has patient fallen in last 6 months? No  LIVING ENVIRONMENT: Lives with: boyfriend Lives in: House/apartment lives on 2nd floor    OCCUPATION: none  PLOF: Independent  PATIENT GOALS: go back to normal; go to the store (avoiding b/c it takes 2 1/2 hours [employer])  my knees limit me; I'd like to start exercising (complex has a small compact-TM, Elliptical hurts)  OBJECTIVE:  Note: Objective measures were completed at Evaluation unless otherwise noted.  DIAGNOSTIC FINDINGS:  None recently  PATIENT SURVEYS:   Initial eval: Modified Oswestry 20%    02/08/24:  Modified Oswestry 11/50= 22%  COGNITION: Overall cognitive status: Within functional limits for tasks assessed      POSTURE: pelvic drop with attempted SLS on right and left 3-4 sec   PALPATION: No tender points  LUMBAR ROM:    AROM eval  Flexion Fingers to ankles mild pain  Extension 20  Right lateral flexion 20  Left lateral flexion 20  Right rotation   Left rotation    (Blank rows = not tested)  TRUNK STRENGTH:  Decreased activation of transverse abdominus muscles; abdominals 4-/5; decreased activation of lumbar multifidi; trunk extensors 4-/5  LOWER EXTREMITY ROM:  bil hip flexion 90 degrees; full knee extension, bil knee flexion 90 degrees  LOWER EXTREMITY MMT:  left hip abduction 3+/5, right hip abduction 4-/5; hip extension 4-/5, quads 4-/5  LUMBAR SPECIAL TESTS:  Negative slump test   FUNCTIONAL TESTS:  Able to STS without hands but it's a struggle Able to bend over (straight knees ) to pick up something from the ground   GAIT: Has a SPC, without cane walks with increased base of support and signs of bil glute weakness  TREATMENT DATE:  [facility] Adult PT Treatment:                                                DATE: 02/26/24 Pt seen for aquatic therapy today.  Treatment took place in water 3.5-4.75 ft in depth at the [facility] pool. Temp of water was 91.  Pt entered/exited the pool via stairs in step-to pattern independently with bilat rail.  * descends with RLE, ascends leading with RLE   - unsupported walking forward/ backward multiple laps, with reciprocal arm swing - side stepping with arm addct/ abdct -> with rainbow hand floats (cues for upright posture) - TrA set with single rainbow hand float pull downs to same side knee taps; opp side knee taps - farmer carry with single yellow hand float at side, walking forward / backward - UE on rainbow hand floats: hip add/abd x 10 ; leg swings into hip flexion /extension x 10;  heel/toe  raises x 10  - return to walking forward/ backward  - forward walking kicks (hip flex to LAQ) - fig 4 stretch R/L holding wall  - straddling noodle without UE support: cycling  Once dried off:  applied reg Rock Tape to both medial and lateral knee  joint lines bilaterally, crossing at tibial tuberosity with 15% stretch to decompress tissue and increase proprioception  02/15/24: Nustep x 5 min level 1 (level 1 per patient request) Seated LAQ 2 x 10 each with 2 lb ankle weights Seated march x 20 with 2 lb ankle weights Seated hip ER 2 x 10 with 2 lb ankle weights Supine quad sets x 20 Supine TKE with larger green noodle with 2 lbs 2 x 10 each SAQ with 2 lb 2 x 10 with teal bolster both SLR 2 x 10 with 2 lbs both Supine heel slides 2 x 10 with towel under heel Supine hip abduction 2 x 10 with towel under heel Supine clam x 20 with yellow loop Sidelying clam 2 x 10 each LE with yellow loop Hook lying bridge 2 x 10  [facility] Adult PT Treatment:                                                DATE: 02/12/24 Pt seen for aquatic therapy today.  Treatment took place in water 3.5-4.75 ft in depth at the [facility] pool. Temp of water was 91.  Pt entered/exited the pool via stairs in step-to pattern independently with bilat rail.  - unsupported walking forward/ backward multiple laps, with reciprocal arm swing - side stepping with arm addct/ abdct -> with rainbow hand floats (cues for upright posture) - farmer carry with single yellow hand float at side, walking forward / backward - TrA set with long hollow pull to front of thighs x 10 each LE in staggered stance - UE on wall: hip add/abd 2 x 10 ; leg swings into hip flexion /extension x 10;  heel/toe raises x 10  - marching forward/ backward  (limits amount of bend due to discomfort)  - forward walking kicks (hip flex to LAQ) x 1 laps - straddling noodle without UE support: cycling  Once dried off:  applied reg Rock Tape to both medial and lateral knee joint lines bilaterally, crossing at tibial tuberosity with 15% stretch to decompress tissue and increase proprioception  02/08/24: Discussed progress and tolerance to pool Repeated ODI 11/50= 22% Nustep x 5 min level 3 Supine quad sets  x 20 Supine clam x 20 with yellow loop Sidelying clam 2 x 10 each LE with yellow loop Hook lying bridge 2 x 10 Supine SLR 2 x 10 each LE Side lying hip abduction 2 x 10 each LE Prone hip extension 2 x 10 each LE   [facility] Adult PT Treatment:                                                DATE: 02/06/24 Pt seen for aquatic therapy today.  Treatment took place in water 3.5-4.75 ft in depth at the [facility] pool. Temp of water was 91.  Pt entered/exited the pool via stairs independently with bilat rail.  - unsupported walking forward/ backward multiple  laps, with reciprocal arm swing - side stepping with arm addct/ abdct -> with rainbow hand floats (cues for sequence and posture)-> without hand floats (improved tolerance) - marching forward/ backward with reciprocal row motion  - UE on wall: hip add/abd x 10 ; leg swings into hip flexion /extension x 10; single leg clams x 10 each LE (cues to reduce height of leg; heel raises x 10  - return to walking forward/ backward  - TrA set with alternating bilat rainbow hand float pull down to thighs x 3 -> with knee taps  - forward walking kicks (hip flex to LAQ) x 3 laps - SLS x 15each (easy) - TrA set with hollow long x 10  - staggered stance with kick board row x 10 each LE forward - straddling noodle without UE support: cycling  Once dried off:  applied reg Rock Tape to both medial and lateral knee joint lines bilaterally, crossing at tibial tuberosity with 15% stretch to decompress tissue and increase proprioception   [facility] Adult PT Treatment:                                                DATE: 01/29/24 Pt seen for aquatic therapy today.  Treatment took place in water 3.5-4.75 ft in depth at the [facility] pool. Temp of water was 91.  Pt entered/exited the pool via stairs independently with bilat rail.  - Intro to aquatic therapy principles - unsupported walking forward/ backward multiple laps - side stepping with arm addct/  abdct -> with rainbow hand floats (cues for sequence and posture) - marching forward/ backward with reciprocal row motion  - UE on wall: hip add/abd x 10 ; hip flexion /extension x 10; single leg clams x 8 each LE (cues to reduce height of leg  - return to walking forward/ backward  - TrA set with 1/2 hollow noodle -> full hollow noodle -> bilat rainbow hand float pull down to thighs x 10  - straddling noodle without UE support: cycling, hip abdct/ addct with feet suspended - UE on rail at stairs: attempted standing quad stretch (limited range, will use noodle next visit); L/R hamstring stretch x 15s  01/25/24 Nustep L5 x 6 min UEs/LEs Supine  LTR x10  Piriformis stretch x 30"  Figure 4 stretch x 30"  Bridge 2x10  Isometric hip flexion 2x5x5" Sidelying  Clamshell 2x10  Hip abd x10 Counter plank 2x20" Counter side plank 2x20" Standing glute med iso 2x5x5"   01/18/24                                                                                                                              Eval  Benefit of aquatic PT for properties of water on joint pain Aquatic PT handouts Initiation of HEP   PATIENT  EDUCATION:  Education details: aquatic therapy progressions/ modifications  Person educated: Patient Education method: Explanation Education comprehension: verbalized understanding  HOME EXERCISE PROGRAM: [place] Access Code: [email or web address] URL: [email or web address] Date: 01/18/2024  ASSESSMENT:  CLINICAL IMPRESSION: Pt reported gradual reduction in knee and back pain to 3-4/10 by end of session.  She requires cues to allow R knee to bend during gait in water initially.  Gait quality improves tremendously when submerged 70%.   She would benefit from continued skilled PT for quad rehab and hip stability.  Will plan to issue aquatic HEP next session.    From eval: Patient is a 58 y.o. female who was seen today for physical therapy evaluation and treatment for left sided  back pain and left sciatica. No current symptoms below left lateral hip.  No neural signs.  Chronic knee pain with limited ROM may have contributed to recent exacerbation of left back and hip pain.  The patient would benefit from PT to address trunk and hip range of motion deficits, strength asymmetries in lumbo/pelvic and hip regions and pain levels that are currently affecting activities of daily living including standing for cooking, sleeping, walking in larger stores like [employer], and doing housework like cleaning the tub.  OBJECTIVE IMPAIRMENTS: decreased activity tolerance, difficulty walking, decreased ROM, decreased strength, impaired perceived functional ability, and pain.   ACTIVITY LIMITATIONS: lifting, bending, standing, squatting, sleeping, and locomotion level  PARTICIPATION LIMITATIONS: meal prep, cleaning, shopping, and community activity  PERSONAL FACTORS: Time since onset of injury/illness/exacerbation and 1-2 comorbidities: HTN, multi body regions affected  are also affecting patient's functional outcome.   REHAB POTENTIAL: Good  CLINICAL DECISION MAKING: Stable/uncomplicated  EVALUATION COMPLEXITY: Low   GOALS: Goals reviewed with patient? Yes  SHORT TERM GOALS: Target date: 02/15/2024   The patient will demonstrate knowledge of basic self care strategies and exercises to promote healing  Baseline: Goal status: In progress  2.  The patient will report a 30% improvement in pain levels with functional activities which are currently difficult including sleeping, cooking and housework Baseline: pt reports 10% on 02/08/24 Goal status: In progress  3.  The patient will have improved hip strength to at least 4/5 needed for standing, walking longer distances and descending stairs at home  Baseline:  Goal status: In progress      LONG TERM GOALS: Target date: 03/14/2024    The patient will be independent in a safe self progression of a home exercise program to promote  further recovery of function  Baseline:  Goal status: INITIAL  2.  The patient will have improved trunk flexor and extensor muscle strength to at least 4+/5 needed for housework and carrying grocery bags Baseline:  Goal status: INITIAL  3.  Patient will have LE strength and stamina to be able to walk around [employer] pushing the shopping cart Baseline:  Goal status: INITIAL  4.  The patient will report a 60% improvement in left back and hip pain levels with functional activities which are currently including sleeping, cooking and housework Baseline:  Goal status: INITIAL  5.  Modified Oswestry Index improved to   14%  indicating improved function with less pain Baseline:  Goal status: INITIAL   PLAN:  PT FREQUENCY: 2x/week  PT DURATION: 8 weeks  PLANNED INTERVENTIONS: [id number]- PT Re-evaluation, 97110-Therapeutic exercises, 97530- Therapeutic activity, [id number]- Neuromuscular re-education, 97535- Self Care, [id number]- Manual therapy, [id number]- Aquatic Therapy, [id number]- Electrical stimulation (unattended), [id number]- Electrical stimulation (manual), [id number]- Ultrasound, [id number]- Traction (mechanical), [id number]- Ionotophoresis 4mg /ml Dexamethasone, Patient/Family education,  Stair training, Taping, Dry Needling, Joint mobilization, Spinal manipulation, Spinal mobilization, Cryotherapy, and Moist heat.  PLAN FOR NEXT SESSION: Emphasize compliance with HEP. Progress as appropriate targeting core and hip strengthening; aquatic PT secondary to chronic knee issues and back issues sensitive to load; Nu-Step or bike.   Almedia Jacobsen, PTA 02/26/24 12:33 PM [facility] [address] [place], [place], [address] Phone: [phone]   Fax:  [phone]

## 2024-02-29 ENCOUNTER — Ambulatory Visit

## 2024-02-29 DIAGNOSIS — R262 Difficulty in walking, not elsewhere classified: Secondary | ICD-10-CM

## 2024-02-29 DIAGNOSIS — M6281 Muscle weakness (generalized): Secondary | ICD-10-CM

## 2024-02-29 DIAGNOSIS — G8929 Other chronic pain: Secondary | ICD-10-CM

## 2024-02-29 DIAGNOSIS — M79604 Pain in right leg: Secondary | ICD-10-CM

## 2024-02-29 DIAGNOSIS — M170 Bilateral primary osteoarthritis of knee: Secondary | ICD-10-CM

## 2024-02-29 DIAGNOSIS — M5459 Other low back pain: Secondary | ICD-10-CM

## 2024-02-29 DIAGNOSIS — M62838 Other muscle spasm: Secondary | ICD-10-CM

## 2024-02-29 DIAGNOSIS — M5442 Lumbago with sciatica, left side: Secondary | ICD-10-CM

## 2024-02-29 DIAGNOSIS — R293 Abnormal posture: Secondary | ICD-10-CM

## 2024-02-29 DIAGNOSIS — R252 Cramp and spasm: Secondary | ICD-10-CM

## 2024-02-29 NOTE — Therapy (Signed)
 OUTPATIENT PHYSICAL THERAPY THORACOLUMBAR TREATMENT   Patient Name: COURTENAY HIRTH MRN: [id number] DOB:Apr 29, 1966, 58 y.o., female Today's Date: 02/29/2024  END OF SESSION:  PT End of Session - 02/29/24 1153     Visit Number 10    Date for PT Re-Evaluation 03/14/24    Authorization Type [employer]    PT Start Time 1146    PT Stop Time 1240    PT Time Calculation (min) 54 min    Activity Tolerance Patient tolerated treatment well    Behavior During Therapy [facility] for tasks assessed/performed              Past Medical History:  Diagnosis Date   Abnormal nuclear stress test 09/25/2015   Back pain    radiating to leg on L side   Chest pain 01/05/2015   Epigastric pain    Essential hypertension, benign 12/11/2013   Hypertension    Migraine    OSA on CPAP    Followed by Dr. Gilford Labs with [facility]   Pain of upper extremity 01/07/2020   Pseudotumor cerebri    SOB (shortness of breath) 09/16/2016   Past Surgical History:  Procedure Laterality Date   CARDIAC CATHETERIZATION N/A 09/25/2015   Procedure: Left Heart Cath and Coronary Angiography;  Surgeon: Peter M Swaziland, MD;  Location: [facility] INVASIVE CV LAB;  Service: Cardiovascular;  Laterality: N/A;   CESAREAN SECTION     Patient Active Problem List   Diagnosis Date Noted   Intractable migraine without aura and without status migrainosus 02/04/2024   IIH (idiopathic intracranial hypertension) 02/04/2024   Pain of upper extremity 01/07/2020   SOB (shortness of breath) 09/16/2016   OSA on CPAP    Abnormal nuclear stress test 09/25/2015   Chest pain 01/05/2015   Essential hypertension, benign 12/11/2013    PCP: Maximo Spar MD  REFERRING PROVIDER: Maximo Spar MD  REFERRING DIAG: M54.42 left sided low back pain with left sided sciatica  Rationale for Evaluation and Treatment: Rehabilitation  THERAPY DIAG:  Left low back pain with left-sided sciatica; weakness  ONSET DATE: 3 weeks ago  SUBJECTIVE:                                                                                                                                                                                            SUBJECTIVE STATEMENT: Pt states that both back and knees are hurting.  She has appt with neurosurgeon to have nerve stimulator implanted for her back pain and they also discussed genicular nerve block. She has not discussed this with her orthopedic provider.    POOL ACCESS:  her apt complex  has outdoor pool.  May look into going to [facility] for additional pool access.   PERTINENT HISTORY:  HTN; chronic knee issues; chronic back issues on/off over the years;  had sciatica during pregnancy Had water ex many years ago Had PT at [facility] last April, May, June for back and leg weakness   PAIN:  02/08/24 Are you having pain? Yes NPRS scale: 2/10 back; 7/10 R knee; 2/10 L knee  Pain location: see above Pain orientation:  PAIN TYPE: aching  Pain description: intermittent  Aggravating factors: cleaning the tub, lying down/sleep Unsure how walking is Relieving factors: sitting OK    PRECAUTIONS: None   WEIGHT BEARING RESTRICTIONS: No  FALLS:  Has patient fallen in last 6 months? No  LIVING ENVIRONMENT: Lives with: boyfriend Lives in: House/apartment lives on 2nd floor    OCCUPATION: none  PLOF: Independent  PATIENT GOALS: go back to normal; go to the store (avoiding b/c it takes 2 1/2 hours [employer])  my knees limit me; I'd like to start exercising (complex has a small compact-TM, Elliptical hurts)  OBJECTIVE:  Note: Objective measures were completed at Evaluation unless otherwise noted.  DIAGNOSTIC FINDINGS:  None recently  PATIENT SURVEYS:   Initial eval: Modified Oswestry 20%    02/08/24:  Modified Oswestry 11/50= 22%  COGNITION: Overall cognitive status: Within functional limits for tasks assessed      POSTURE: pelvic drop with attempted SLS on right and left 3-4 sec   PALPATION: No tender  points  LUMBAR ROM:   AROM eval  Flexion Fingers to ankles mild pain  Extension 20  Right lateral flexion 20  Left lateral flexion 20  Right rotation   Left rotation    (Blank rows = not tested)  TRUNK STRENGTH:  Decreased activation of transverse abdominus muscles; abdominals 4-/5; decreased activation of lumbar multifidi; trunk extensors 4-/5  LOWER EXTREMITY ROM:  bil hip flexion 90 degrees; full knee extension, bil knee flexion 90 degrees  LOWER EXTREMITY MMT:  left hip abduction 3+/5, right hip abduction 4-/5; hip extension 4-/5, quads 4-/5  LUMBAR SPECIAL TESTS:  Negative slump test   FUNCTIONAL TESTS:  Able to STS without hands but it's a struggle Able to bend over (straight knees ) to pick up something from the ground   GAIT: Has a SPC, without cane walks with increased base of support and signs of bil glute weakness  TREATMENT DATE:  02/29/24: Nustep x 5 min level 1 (level 1 per patient request) Seated LAQ 2 x 10 each with 2 lb ankle weights Lengthy discussion about goals and how her pain level has persisted despite therapy.  Discussed needing follow up with ortho MD about her knees as they have worsened since her last follow up.  Spine MD wants to do stimulator for low back and also suggested genicular nerve block.  Suggested she discuss the genicular nerve block with her orthopedist.  Seated march x 20 with 2 lb ankle weights Seated hip ER 2 x 10 with 2 lb ankle weights Supine PPT x 20 Supine SKTC x 5 each LE holding 10 sec each Supine DKTC x 5 holding 10 sec each PPT with heel taps x 20 PPT with alternating arms and legs x 20 Ice to bilateral knees and to low back x 10 min at end of session  02/15/24: Nustep x 5 min level 1 (level 1 per patient request) Seated LAQ 2 x 10 each with 2 lb ankle weights Seated march x 20 with 2  lb ankle weights Seated hip ER 2 x 10 with 2 lb ankle weights Supine quad sets x 20 Supine TKE with larger green noodle with 2 lbs 2 x  10 each SAQ with 2 lb 2 x 10 with teal bolster both SLR 2 x 10 with 2 lbs both Supine heel slides 2 x 10 with towel under heel Supine hip abduction 2 x 10 with towel under heel Supine clam x 20 with yellow loop Sidelying clam 2 x 10 each LE with yellow loop Hook lying bridge 2 x 10  [facility] Adult PT Treatment:                                                DATE: 02/12/24 Pt seen for aquatic therapy today.  Treatment took place in water 3.5-4.75 ft in depth at the [facility] pool. Temp of water was 91.  Pt entered/exited the pool via stairs in step-to pattern independently with bilat rail.  - unsupported walking forward/ backward multiple laps, with reciprocal arm swing - side stepping with arm addct/ abdct -> with rainbow hand floats (cues for upright posture) - farmer carry with single yellow hand float at side, walking forward / backward - TrA set with long hollow pull to front of thighs x 10 each LE in staggered stance - UE on wall: hip add/abd 2 x 10 ; leg swings into hip flexion /extension x 10;  heel/toe raises x 10  - marching forward/ backward  (limits amount of bend due to discomfort)  - forward walking kicks (hip flex to LAQ) x 1 laps - straddling noodle without UE support: cycling  Once dried off:  applied reg Rock Tape to both medial and lateral knee joint lines bilaterally, crossing at tibial tuberosity with 15% stretch to decompress tissue and increase proprioception  02/08/24: Discussed progress and tolerance to pool Repeated ODI 11/50= 22% Nustep x 5 min level 3 Supine quad sets x 20 Supine clam x 20 with yellow loop Sidelying clam 2 x 10 each LE with yellow loop Hook lying bridge 2 x 10 Supine SLR 2 x 10 each LE Side lying hip abduction 2 x 10 each LE Prone hip extension 2 x 10 each LE   [facility] Adult PT Treatment:                                                DATE: 02/06/24 Pt seen for aquatic therapy today.  Treatment took place in water 3.5-4.75 ft in depth at  the [facility] pool. Temp of water was 91.  Pt entered/exited the pool via stairs independently with bilat rail.  - unsupported walking forward/ backward multiple laps, with reciprocal arm swing - side stepping with arm addct/ abdct -> with rainbow hand floats (cues for sequence and posture)-> without hand floats (improved tolerance) - marching forward/ backward with reciprocal row motion  - UE on wall: hip add/abd x 10 ; leg swings into hip flexion /extension x 10; single leg clams x 10 each LE (cues to reduce height of leg; heel raises x 10  - return to walking forward/ backward  - TrA set with alternating bilat rainbow hand float pull down to thighs x 3 -> with  knee taps  - forward walking kicks (hip flex to LAQ) x 3 laps - SLS x 15each (easy) - TrA set with hollow long x 10  - staggered stance with kick board row x 10 each LE forward - straddling noodle without UE support: cycling  Once dried off:  applied reg Rock Tape to both medial and lateral knee joint lines bilaterally, crossing at tibial tuberosity with 15% stretch to decompress tissue and increase proprioception   [facility] Adult PT Treatment:                                                DATE: 01/29/24 Pt seen for aquatic therapy today.  Treatment took place in water 3.5-4.75 ft in depth at the [facility] pool. Temp of water was 91.  Pt entered/exited the pool via stairs independently with bilat rail.  - Intro to aquatic therapy principles - unsupported walking forward/ backward multiple laps - side stepping with arm addct/ abdct -> with rainbow hand floats (cues for sequence and posture) - marching forward/ backward with reciprocal row motion  - UE on wall: hip add/abd x 10 ; hip flexion /extension x 10; single leg clams x 8 each LE (cues to reduce height of leg  - return to walking forward/ backward  - TrA set with 1/2 hollow noodle -> full hollow noodle -> bilat rainbow hand float pull down to thighs x 10   - straddling noodle without UE support: cycling, hip abdct/ addct with feet suspended - UE on rail at stairs: attempted standing quad stretch (limited range, will use noodle next visit); L/R hamstring stretch x 15s  01/25/24 Nustep L5 x 6 min UEs/LEs Supine  LTR x10  Piriformis stretch x 30"  Figure 4 stretch x 30"  Bridge 2x10  Isometric hip flexion 2x5x5" Sidelying  Clamshell 2x10  Hip abd x10 Counter plank 2x20" Counter side plank 2x20" Standing glute med iso 2x5x5"   01/18/24                                                                                                                              Eval  Benefit of aquatic PT for properties of water on joint pain Aquatic PT handouts Initiation of HEP   PATIENT EDUCATION:  Education details: aquatic therapy progressions/ modifications  Person educated: Patient Education method: Explanation Education comprehension: verbalized understanding  HOME EXERCISE PROGRAM: [place] Access Code: [email or web address] URL: [email or web address] Date: 01/18/2024  ASSESSMENT:  CLINICAL IMPRESSION: Shatisha has multiple orthopedic issues affecting her function and quality of life.  Her knees appear to be the primary issue but she has not been back to see her orthopedic provider.  We discussed multiple options that would typically be available to her.  She states that the ortho provider says she is too young for knee replacements  and that he wants her to lose weight before he would even consider doing the procedures.  She is desperate for relief from the pain and this is why she is considering the spine stimulator and possibly the genicular nerve block.  She is losing function and her back pain is worsening due to her severe gait abnormality.   She would benefit from continued skilled PT for quad rehab and hip stability.     From eval: Patient is a 58 y.o. female who was seen today for physical therapy evaluation and treatment for left sided  back pain and left sciatica. No current symptoms below left lateral hip.  No neural signs.  Chronic knee pain with limited ROM may have contributed to recent exacerbation of left back and hip pain.  The patient would benefit from PT to address trunk and hip range of motion deficits, strength asymmetries in lumbo/pelvic and hip regions and pain levels that are currently affecting activities of daily living including standing for cooking, sleeping, walking in larger stores like [employer], and doing housework like cleaning the tub.  OBJECTIVE IMPAIRMENTS: decreased activity tolerance, difficulty walking, decreased ROM, decreased strength, impaired perceived functional ability, and pain.   ACTIVITY LIMITATIONS: lifting, bending, standing, squatting, sleeping, and locomotion level  PARTICIPATION LIMITATIONS: meal prep, cleaning, shopping, and community activity  PERSONAL FACTORS: Time since onset of injury/illness/exacerbation and 1-2 comorbidities: HTN, multi body regions affected  are also affecting patient's functional outcome.   REHAB POTENTIAL: Good  CLINICAL DECISION MAKING: Stable/uncomplicated  EVALUATION COMPLEXITY: Low   GOALS: Goals reviewed with patient? Yes  SHORT TERM GOALS: Target date: 02/15/2024   The patient will demonstrate knowledge of basic self care strategies and exercises to promote healing  Baseline: Goal status: In progress  2.  The patient will report a 30% improvement in pain levels with functional activities which are currently difficult including sleeping, cooking and housework Baseline: pt reports 10% on 02/08/24 Goal status: In progress  3.  The patient will have improved hip strength to at least 4/5 needed for standing, walking longer distances and descending stairs at home  Baseline:  Goal status: In progress      LONG TERM GOALS: Target date: 03/14/2024    The patient will be independent in a safe self progression of a home exercise program to promote  further recovery of function  Baseline:  Goal status: INITIAL  2.  The patient will have improved trunk flexor and extensor muscle strength to at least 4+/5 needed for housework and carrying grocery bags Baseline:  Goal status: INITIAL  3.  Patient will have LE strength and stamina to be able to walk around [employer] pushing the shopping cart Baseline:  Goal status: INITIAL  4.  The patient will report a 60% improvement in left back and hip pain levels with functional activities which are currently including sleeping, cooking and housework Baseline:  Goal status: INITIAL  5.  Modified Oswestry Index improved to   14%  indicating improved function with less pain Baseline:  Goal status: INITIAL   PLAN:  PT FREQUENCY: 2x/week  PT DURATION: 8 weeks  PLANNED INTERVENTIONS: [id number]- PT Re-evaluation, 97110-Therapeutic exercises, 97530- Therapeutic activity, 97112- Neuromuscular re-education, 97535- Self Care, [id number]- Manual therapy, [id number]- Aquatic Therapy, [id number]- Electrical stimulation (unattended), [id number]- Electrical stimulation (manual), [id number]- Ultrasound, [id number]- Traction (mechanical), [id number]- Ionotophoresis 4mg /ml Dexamethasone, Patient/Family education, Stair training, Taping, Dry Needling, Joint mobilization, Spinal manipulation, Spinal mobilization, Cryotherapy, and Moist heat.  PLAN FOR NEXT SESSION: Emphasize compliance with HEP,  discuss weight loss, Progress as appropriate targeting core and hip strengthening; aquatic PT secondary to chronic knee issues and back issues sensitive to load; Nu-Step or bike.   [facility]get[facility] B. Juliona Vales, PT 02/29/24 5:06 PM [facility] [address] [place], [place] [address] Phone # [phone] Fax [phone]

## 2024-03-04 ENCOUNTER — Ambulatory Visit (HOSPITAL_BASED_OUTPATIENT_CLINIC_OR_DEPARTMENT_OTHER): Admitting: Physical Therapy

## 2024-03-04 ENCOUNTER — Encounter (HOSPITAL_BASED_OUTPATIENT_CLINIC_OR_DEPARTMENT_OTHER): Payer: Self-pay | Admitting: Physical Therapy

## 2024-03-04 DIAGNOSIS — M6281 Muscle weakness (generalized): Secondary | ICD-10-CM

## 2024-03-04 DIAGNOSIS — M5442 Lumbago with sciatica, left side: Secondary | ICD-10-CM | POA: Diagnosis not present

## 2024-03-04 DIAGNOSIS — G8929 Other chronic pain: Secondary | ICD-10-CM

## 2024-03-04 DIAGNOSIS — R262 Difficulty in walking, not elsewhere classified: Secondary | ICD-10-CM

## 2024-03-04 DIAGNOSIS — R252 Cramp and spasm: Secondary | ICD-10-CM

## 2024-03-04 NOTE — Therapy (Signed)
 OUTPATIENT PHYSICAL THERAPY THORACOLUMBAR TREATMENT   Patient Name: Linda Cunningham MRN: [id number] DOB:01/29/1966, 58 y.o., female Today's Date: 03/04/2024  END OF SESSION:  PT End of Session - 03/04/24 1155     Visit Number 11    Date for PT Re-Evaluation 03/14/24    Authorization Type [employer]    PT Start Time 1145    PT Stop Time 1223    PT Time Calculation (min) 38 min    Activity Tolerance Patient tolerated treatment well    Behavior During Therapy [facility] for tasks assessed/performed              Past Medical History:  Diagnosis Date   Abnormal nuclear stress test 09/25/2015   Back pain    radiating to leg on L side   Chest pain 01/05/2015   Epigastric pain    Essential hypertension, benign 12/11/2013   Hypertension    Migraine    OSA on CPAP    Followed by Dr. Gilford Labs with [facility]   Pain of upper extremity 01/07/2020   Pseudotumor cerebri    SOB (shortness of breath) 09/16/2016   Past Surgical History:  Procedure Laterality Date   CARDIAC CATHETERIZATION N/A 09/25/2015   Procedure: Left Heart Cath and Coronary Angiography;  Surgeon: Peter M Swaziland, MD;  Location: [facility] INVASIVE CV LAB;  Service: Cardiovascular;  Laterality: N/A;   CESAREAN SECTION     Patient Active Problem List   Diagnosis Date Noted   Intractable migraine without aura and without status migrainosus 02/04/2024   IIH (idiopathic intracranial hypertension) 02/04/2024   Pain of upper extremity 01/07/2020   SOB (shortness of breath) 09/16/2016   OSA on CPAP    Abnormal nuclear stress test 09/25/2015   Chest pain 01/05/2015   Essential hypertension, benign 12/11/2013    PCP: Maximo Spar MD  REFERRING PROVIDER: Maximo Spar MD  REFERRING DIAG: M54.42 left sided low back pain with left sided sciatica  Rationale for Evaluation and Treatment: Rehabilitation  THERAPY DIAG:  Left low back pain with left-sided sciatica; weakness  ONSET DATE: 3 weeks ago  SUBJECTIVE:                                                                                                                                                                                            SUBJECTIVE STATEMENT: Pt states that she has not checked into pool yet (outside of her apt complex), but plans to.  "I feel like I'm walking better".     POOL ACCESS:  her apt complex has outdoor pool.  May look into going to [facility] for additional pool access.  PERTINENT HISTORY:  HTN; chronic knee issues; chronic back issues on/off over the years;  had sciatica during pregnancy Had water ex many years ago Had PT at [facility] last April, May, June for back and leg weakness   PAIN:  02/08/24 Are you having pain? Yes NPRS scale: 4/10 back; 4/10 Knees  Pain location: see above Pain orientation:  PAIN TYPE: aching  Pain description: intermittent  Aggravating factors: cleaning the tub, lying down/sleep Unsure how walking is Relieving factors: sitting OK    PRECAUTIONS: None   WEIGHT BEARING RESTRICTIONS: No  FALLS:  Has patient fallen in last 6 months? No  LIVING ENVIRONMENT: Lives with: boyfriend Lives in: House/apartment lives on 2nd floor    OCCUPATION: none  PLOF: Independent  PATIENT GOALS: go back to normal; go to the store (avoiding b/c it takes 2 1/2 hours [employer])  my knees limit me; I'd like to start exercising (complex has a small compact-TM, Elliptical hurts)  OBJECTIVE:  Note: Objective measures were completed at Evaluation unless otherwise noted.  DIAGNOSTIC FINDINGS:  None recently  PATIENT SURVEYS:   Initial eval: Modified Oswestry 20%    02/08/24:  Modified Oswestry 11/50= 22%  COGNITION: Overall cognitive status: Within functional limits for tasks assessed      POSTURE: pelvic drop with attempted SLS on right and left 3-4 sec   PALPATION: No tender points  LUMBAR ROM:   AROM eval  Flexion Fingers to ankles mild pain  Extension 20  Right lateral flexion 20  Left  lateral flexion 20  Right rotation   Left rotation    (Blank rows = not tested)  TRUNK STRENGTH:  Decreased activation of transverse abdominus muscles; abdominals 4-/5; decreased activation of lumbar multifidi; trunk extensors 4-/5  LOWER EXTREMITY ROM:  bil hip flexion 90 degrees; full knee extension, bil knee flexion 90 degrees  LOWER EXTREMITY MMT:  left hip abduction 3+/5, right hip abduction 4-/5; hip extension 4-/5, quads 4-/5  LUMBAR SPECIAL TESTS:  Negative slump test   FUNCTIONAL TESTS:  Able to STS without hands but it's a struggle Able to bend over (straight knees ) to pick up something from the ground   GAIT: Has a SPC, without cane walks with increased base of support and signs of bil glute weakness  TREATMENT DATE:  [facility] Adult PT Treatment:                                                DATE: 03/04/24 Pt seen for aquatic therapy today.  Treatment took place in water 3.5-4.75 ft in depth at the [facility] pool. Temp of water was 91.  Pt entered/exited the pool via stairs in step-to pattern independently with bilat rail.  - farmer carry with bilat rainbow hand float at side, walking forward / backward - side stepping with arm addct/ abdct with rainbow hand floats - UE on wall: hamstring curls, alternating, x 5 each LE;  hip add/abd 2 x 10 ; heel/toe raises x 10; relaxed squats x 10 - return to walking forward/ backward with reciprocal arm swing  - UE on wall:  leg swings into hip flexion /extension x 10;  Hip circles (outward) x 10 each - straddling noodle without UE support: cycling, multiple laps  - TrA set with long hollow noodle-> single/bilat rainbow hand float pull to front of thighs x 10 each  LE in staggered stance  02/29/24: Nustep x 5 min level 1 (level 1 per patient request) Seated LAQ 2 x 10 each with 2 lb ankle weights Lengthy discussion about goals and how her pain level has persisted despite therapy.  Discussed needing follow up with ortho MD  about her knees as they have worsened since her last follow up.  Spine MD wants to do stimulator for low back and also suggested genicular nerve block.  Suggested she discuss the genicular nerve block with her orthopedist.  Seated march x 20 with 2 lb ankle weights Seated hip ER 2 x 10 with 2 lb ankle weights Supine PPT x 20 Supine SKTC x 5 each LE holding 10 sec each Supine DKTC x 5 holding 10 sec each PPT with heel taps x 20 PPT with alternating arms and legs x 20 Ice to bilateral knees and to low back x 10 min at end of session  02/15/24: Nustep x 5 min level 1 (level 1 per patient request) Seated LAQ 2 x 10 each with 2 lb ankle weights Seated march x 20 with 2 lb ankle weights Seated hip ER 2 x 10 with 2 lb ankle weights Supine quad sets x 20 Supine TKE with larger green noodle with 2 lbs 2 x 10 each SAQ with 2 lb 2 x 10 with teal bolster both SLR 2 x 10 with 2 lbs both Supine heel slides 2 x 10 with towel under heel Supine hip abduction 2 x 10 with towel under heel Supine clam x 20 with yellow loop Sidelying clam 2 x 10 each LE with yellow loop Hook lying bridge 2 x 10  [facility] Adult PT Treatment:                                                DATE: 02/12/24 Pt seen for aquatic therapy today.  Treatment took place in water 3.5-4.75 ft in depth at the [facility] pool. Temp of water was 91.  Pt entered/exited the pool via stairs in step-to pattern independently with bilat rail.  - unsupported walking forward/ backward multiple laps, with reciprocal arm swing - side stepping with arm addct/ abdct -> with rainbow hand floats (cues for upright posture) - farmer carry with single yellow hand float at side, walking forward / backward - TrA set with long hollow pull to front of thighs x 10 each LE in staggered stance - UE on wall: hip add/abd 2 x 10 ; leg swings into hip flexion /extension x 10;  heel/toe raises x 10  - marching forward/ backward  (limits amount of bend due to  discomfort)  - forward walking kicks (hip flex to LAQ) x 1 laps - straddling noodle without UE support: cycling  Once dried off:  applied reg Rock Tape to both medial and lateral knee joint lines bilaterally, crossing at tibial tuberosity with 15% stretch to decompress tissue and increase proprioception  02/08/24: Discussed progress and tolerance to pool Repeated ODI 11/50= 22% Nustep x 5 min level 3 Supine quad sets x 20 Supine clam x 20 with yellow loop Sidelying clam 2 x 10 each LE with yellow loop Hook lying bridge 2 x 10 Supine SLR 2 x 10 each LE Side lying hip abduction 2 x 10 each LE Prone hip extension 2 x 10 each LE   [facility] Adult  PT Treatment:                                                DATE: 02/06/24 Pt seen for aquatic therapy today.  Treatment took place in water 3.5-4.75 ft in depth at the [facility] pool. Temp of water was 91.  Pt entered/exited the pool via stairs independently with bilat rail.  - unsupported walking forward/ backward multiple laps, with reciprocal arm swing - side stepping with arm addct/ abdct -> with rainbow hand floats (cues for sequence and posture)-> without hand floats (improved tolerance) - marching forward/ backward with reciprocal row motion  - UE on wall: hip add/abd x 10 ; leg swings into hip flexion /extension x 10; single leg clams x 10 each LE (cues to reduce height of leg; heel raises x 10  - return to walking forward/ backward  - TrA set with alternating bilat rainbow hand float pull down to thighs x 3 -> with knee taps  - forward walking kicks (hip flex to LAQ) x 3 laps - SLS x 15each (easy) - TrA set with hollow long x 10  - staggered stance with kick board row x 10 each LE forward - straddling noodle without UE support: cycling  Once dried off:  applied reg Rock Tape to both medial and lateral knee joint lines bilaterally, crossing at tibial tuberosity with 15% stretch to decompress tissue and increase  proprioception   [facility] Adult PT Treatment:                                                DATE: 01/29/24 Pt seen for aquatic therapy today.  Treatment took place in water 3.5-4.75 ft in depth at the [facility] pool. Temp of water was 91.  Pt entered/exited the pool via stairs independently with bilat rail.  - Intro to aquatic therapy principles - unsupported walking forward/ backward multiple laps - side stepping with arm addct/ abdct -> with rainbow hand floats (cues for sequence and posture) - marching forward/ backward with reciprocal row motion  - UE on wall: hip add/abd x 10 ; hip flexion /extension x 10; single leg clams x 8 each LE (cues to reduce height of leg  - return to walking forward/ backward  - TrA set with 1/2 hollow noodle -> full hollow noodle -> bilat rainbow hand float pull down to thighs x 10  - straddling noodle without UE support: cycling, hip abdct/ addct with feet suspended - UE on rail at stairs: attempted standing quad stretch (limited range, will use noodle next visit); L/R hamstring stretch x 15s  01/25/24 Nustep L5 x 6 min UEs/LEs Supine  LTR x10  Piriformis stretch x 30"  Figure 4 stretch x 30"  Bridge 2x10  Isometric hip flexion 2x5x5" Sidelying  Clamshell 2x10  Hip abd x10 Counter plank 2x20" Counter side plank 2x20" Standing glute med iso 2x5x5"   01/18/24  Eval  Benefit of aquatic PT for properties of water on joint pain Aquatic PT handouts Initiation of HEP   PATIENT EDUCATION:  Education details: aquatic therapy progressions/ modifications  Person educated: Patient Education method: Explanation Education comprehension: verbalized understanding  HOME EXERCISE PROGRAM: [place] Access Code: [email or web address] URL: [email or web address] Date: 01/18/2024  ASSESSMENT:  CLINICAL IMPRESSION: Sant[place]  tolerates aquatic exercises well, without increase in pain level.  Plan to issue laminated aquatic HEP next session, but her to continue on own independently; pt verbalized understanding.  She would benefit from continued skilled PT for quad rehab and hip stability.  Pt is progressing gradually towards established goals.    From eval: Patient is a 58 y.o. female who was seen today for physical therapy evaluation and treatment for left sided back pain and left sciatica. No current symptoms below left lateral hip.  No neural signs.  Chronic knee pain with limited ROM may have contributed to recent exacerbation of left back and hip pain.  The patient would benefit from PT to address trunk and hip range of motion deficits, strength asymmetries in lumbo/pelvic and hip regions and pain levels that are currently affecting activities of daily living including standing for cooking, sleeping, walking in larger stores like [employer], and doing housework like cleaning the tub.  OBJECTIVE IMPAIRMENTS: decreased activity tolerance, difficulty walking, decreased ROM, decreased strength, impaired perceived functional ability, and pain.   ACTIVITY LIMITATIONS: lifting, bending, standing, squatting, sleeping, and locomotion level  PARTICIPATION LIMITATIONS: meal prep, cleaning, shopping, and community activity  PERSONAL FACTORS: Time since onset of injury/illness/exacerbation and 1-2 comorbidities: HTN, multi body regions affected  are also affecting patient's functional outcome.   REHAB POTENTIAL: Good  CLINICAL DECISION MAKING: Stable/uncomplicated  EVALUATION COMPLEXITY: Low   GOALS: Goals reviewed with patient? Yes  SHORT TERM GOALS: Target date: 02/15/2024   The patient will demonstrate knowledge of basic self care strategies and exercises to promote healing  Baseline: Goal status: In progress  2.  The patient will report a 30% improvement in pain levels with functional activities which are currently  difficult including sleeping, cooking and housework Baseline: pt reports 10% on 02/08/24 Goal status: In progress  3.  The patient will have improved hip strength to at least 4/5 needed for standing, walking longer distances and descending stairs at home  Baseline:  Goal status: In progress      LONG TERM GOALS: Target date: 03/14/2024    The patient will be independent in a safe self progression of a home exercise program to promote further recovery of function  Baseline:  Goal status: INITIAL  2.  The patient will have improved trunk flexor and extensor muscle strength to at least 4+/5 needed for housework and carrying grocery bags Baseline:  Goal status: INITIAL  3.  Patient will have LE strength and stamina to be able to walk around [employer] pushing the shopping cart Baseline:  Goal status: INITIAL  4.  The patient will report a 60% improvement in left back and hip pain levels with functional activities which are currently including sleeping, cooking and housework Baseline:  Goal status: INITIAL  5.  Modified Oswestry Index improved to   14%  indicating improved function with less pain Baseline:  Goal status: INITIAL   PLAN:  PT FREQUENCY: 2x/week  PT DURATION: 8 weeks  PLANNED INTERVENTIONS: 97164- PT Re-evaluation, 97110-Therapeutic exercises, 97530- Therapeutic activity, [id number]- Neuromuscular re-education, 97535- Self Care, 16109- Manual therapy, [id number]- Aquatic Therapy, [id number]- Electrical stimulation (unattended), [id number]- Electrical stimulation (manual),  [id number]- Ultrasound, 19147- Traction (mechanical), [id number]- Ionotophoresis 4mg /ml Dexamethasone, Patient/Family education, Stair training, Taping, Dry Needling, Joint mobilization, Spinal manipulation, Spinal mobilization, Cryotherapy, and Moist heat.  PLAN FOR NEXT SESSION: Emphasize compliance with HEP, discuss weight loss, Progress as appropriate targeting core and hip strengthening; aquatic PT secondary to chronic knee  issues and back issues sensitive to load; Nu-Step or bike.   Almedia Jacobsen, PTA 03/04/24 12:42 PM [facility] [address] [place], [place], [address] Phone: [phone]   Fax:  [phone]

## 2024-03-07 ENCOUNTER — Ambulatory Visit: Attending: Family Medicine

## 2024-03-07 ENCOUNTER — Telehealth: Payer: Self-pay | Admitting: Neurology

## 2024-03-07 DIAGNOSIS — M25562 Pain in left knee: Secondary | ICD-10-CM | POA: Diagnosis present

## 2024-03-07 DIAGNOSIS — R252 Cramp and spasm: Secondary | ICD-10-CM | POA: Insufficient documentation

## 2024-03-07 DIAGNOSIS — G8929 Other chronic pain: Secondary | ICD-10-CM | POA: Diagnosis present

## 2024-03-07 DIAGNOSIS — R293 Abnormal posture: Secondary | ICD-10-CM | POA: Diagnosis present

## 2024-03-07 DIAGNOSIS — M79604 Pain in right leg: Secondary | ICD-10-CM | POA: Insufficient documentation

## 2024-03-07 DIAGNOSIS — M62838 Other muscle spasm: Secondary | ICD-10-CM | POA: Insufficient documentation

## 2024-03-07 DIAGNOSIS — M5459 Other low back pain: Secondary | ICD-10-CM | POA: Insufficient documentation

## 2024-03-07 DIAGNOSIS — M5442 Lumbago with sciatica, left side: Secondary | ICD-10-CM | POA: Diagnosis present

## 2024-03-07 DIAGNOSIS — M6281 Muscle weakness (generalized): Secondary | ICD-10-CM | POA: Insufficient documentation

## 2024-03-07 DIAGNOSIS — R262 Difficulty in walking, not elsewhere classified: Secondary | ICD-10-CM | POA: Insufficient documentation

## 2024-03-07 DIAGNOSIS — M170 Bilateral primary osteoarthritis of knee: Secondary | ICD-10-CM | POA: Insufficient documentation

## 2024-03-07 NOTE — Therapy (Signed)
 OUTPATIENT PHYSICAL THERAPY THORACOLUMBAR TREATMENT   Patient Name: Linda Cunningham MRN: [id number] DOB:1965-11-28, 58 y.o., female Today's Date: 03/07/2024  END OF SESSION:  PT End of Session - 03/07/24 1045     Visit Number 12    Date for PT Re-Evaluation 03/14/24    Authorization Type [employer] Medicaid    PT Start Time 1022    PT Stop Time 1115    PT Time Calculation (min) 53 min    Activity Tolerance Patient tolerated treatment well    Behavior During Therapy [facility] for tasks assessed/performed              Past Medical History:  Diagnosis Date   Abnormal nuclear stress test 09/25/2015   Back pain    radiating to leg on L side   Chest pain 01/05/2015   Epigastric pain    Essential hypertension, benign 12/11/2013   Hypertension    Migraine    OSA on CPAP    Followed by Dr. Gilford Labs with [facility]   Pain of upper extremity 01/07/2020   Pseudotumor cerebri    SOB (shortness of breath) 09/16/2016   Past Surgical History:  Procedure Laterality Date   CARDIAC CATHETERIZATION N/A 09/25/2015   Procedure: Left Heart Cath and Coronary Angiography;  Surgeon: Peter M Swaziland, MD;  Location: [facility] INVASIVE CV LAB;  Service: Cardiovascular;  Laterality: N/A;   CESAREAN SECTION     Patient Active Problem List   Diagnosis Date Noted   Intractable migraine without aura and without status migrainosus 02/04/2024   IIH (idiopathic intracranial hypertension) 02/04/2024   Pain of upper extremity 01/07/2020   SOB (shortness of breath) 09/16/2016   OSA on CPAP    Abnormal nuclear stress test 09/25/2015   Chest pain 01/05/2015   Essential hypertension, benign 12/11/2013    PCP: Maximo Spar MD  REFERRING PROVIDER: Maximo Spar MD  REFERRING DIAG: M54.42 left sided low back pain with left sided sciatica  Rationale for Evaluation and Treatment: Rehabilitation  THERAPY DIAG:  Left low back pain with left-sided sciatica; weakness  ONSET DATE: 3 weeks ago  SUBJECTIVE:                                                                                                                                                                                            SUBJECTIVE STATEMENT: Pt reports she had injections yesterday in both knees.  She reports she is doing so much better today.  She walks in with cane today and her gait is significantly less antalgic.  She states that she will be going by [facility] today to get signed up  for classes.  She brings in new cane today to try out.    POOL ACCESS:  her apt complex has outdoor pool.  May look into going to [facility] for additional pool access.   PERTINENT HISTORY:  HTN; chronic knee issues; chronic back issues on/off over the years;  had sciatica during pregnancy Had water ex many years ago Had PT at [facility] last April, May, June for back and leg weakness   PAIN:  03/07/24 Are you having pain? Yes NPRS scale: 4/10 back; 3-4/10 Knees  Pain location: see above Pain orientation:  PAIN TYPE: aching  Pain description: intermittent  Aggravating factors: cleaning the tub, lying down/sleep Unsure how walking is Relieving factors: sitting OK    PRECAUTIONS: None   WEIGHT BEARING RESTRICTIONS: No  FALLS:  Has patient fallen in last 6 months? No  LIVING ENVIRONMENT: Lives with: boyfriend Lives in: House/apartment lives on 2nd floor    OCCUPATION: none  PLOF: Independent  PATIENT GOALS: go back to normal; go to the store (avoiding b/c it takes 2 1/2 hours [employer])  my knees limit me; I'd like to start exercising (complex has a small compact-TM, Elliptical hurts)  OBJECTIVE:  Note: Objective measures were completed at Evaluation unless otherwise noted.  DIAGNOSTIC FINDINGS:  None recently  PATIENT SURVEYS:   Initial eval: Modified Oswestry 20%    02/08/24:  Modified Oswestry 11/50= 22%  COGNITION: Overall cognitive status: Within functional limits for tasks assessed      POSTURE: pelvic drop with attempted SLS  on right and left 3-4 sec   PALPATION: No tender points  LUMBAR ROM:   AROM eval  Flexion Fingers to ankles mild pain  Extension 20  Right lateral flexion 20  Left lateral flexion 20  Right rotation   Left rotation    (Blank rows = not tested)  TRUNK STRENGTH:  Decreased activation of transverse abdominus muscles; abdominals 4-/5; decreased activation of lumbar multifidi; trunk extensors 4-/5  LOWER EXTREMITY ROM:  bil hip flexion 90 degrees; full knee extension, bil knee flexion 90 degrees  LOWER EXTREMITY MMT:  left hip abduction 3+/5, right hip abduction 4-/5; hip extension 4-/5, quads 4-/5  LUMBAR SPECIAL TESTS:  Negative slump test   FUNCTIONAL TESTS:  Able to STS without hands but it's a struggle Able to bend over (straight knees ) to pick up something from the ground   GAIT: Has a SPC, without cane walks with increased base of support and signs of bil glute weakness  TREATMENT DATE:  03/07/24: Instructed in use of new cane and how to adjust (cane was adjusted to lowest setting and may be slightly taller than her other cane)  Gait training with new cane- explained that the canes with the small base have a pistoning/pivot type base to allow for smoother gait transition Nustep x 5 min level 1 (level 1 per patient request) Seated LAQ 2 x 10 each with 2.5 lb ankle weights both Seated march with 2.5 lb ankle weights 2 x 10 each LE Seated hip ER with 2.5 lb ankle weights 2 x 10 each  Standing hamstring curl with 2.5 lb ankle weights  Standing hip abduction 2 x 10 with 2.5 lb ankle weights Lateral band walks with blue loop  Discussed pool options and access to each Educated on why some knee replacement patients are more successful than others: avoid self directing, following protocol.   Ice to bilateral knees and to low back x 10 min at end of session  [facility] Adult PT Treatment:                                                DATE: 03/04/24 Pt seen for aquatic therapy today.   Treatment took place in water 3.5-4.75 ft in depth at the [facility] pool. Temp of water was 91.  Pt entered/exited the pool via stairs in step-to pattern independently with bilat rail.  - farmer carry with bilat rainbow hand float at side, walking forward / backward - side stepping with arm addct/ abdct with rainbow hand floats - UE on wall: hamstring curls, alternating, x 5 each LE;  hip add/abd 2 x 10 ; heel/toe raises x 10; relaxed squats x 10 - return to walking forward/ backward with reciprocal arm swing  - UE on wall:  leg swings into hip flexion /extension x 10;  Hip circles (outward) x 10 each - straddling noodle without UE support: cycling, multiple laps  - TrA set with long hollow noodle-> single/bilat rainbow hand float pull to front of thighs x 10 each LE in staggered stance  02/29/24: Nustep x 5 min level 1 (level 1 per patient request) Seated LAQ 2 x 10 each with 2 lb ankle weights Lengthy discussion about goals and how her pain level has persisted despite therapy.  Discussed needing follow up with ortho MD about her knees as they have worsened since her last follow up.  Spine MD wants to do stimulator for low back and also suggested genicular nerve block.  Suggested she discuss the genicular nerve block with her orthopedist.  Seated march x 20 with 2 lb ankle weights Seated hip ER 2 x 10 with 2 lb ankle weights Supine PPT x 20 Supine SKTC x 5 each LE holding 10 sec each Supine DKTC x 5 holding 10 sec each PPT with heel taps x 20 PPT with alternating arms and legs x 20 Ice to bilateral knees and to low back x 10 min at end of session  01/18/24                                                                                                                              Eval  Benefit of aquatic PT for properties of water on joint pain Aquatic PT handouts Initiation of HEP   PATIENT EDUCATION:  Education details: aquatic therapy progressions/ modifications  Person  educated: Patient Education method: Explanation Education comprehension: verbalized understanding  HOME EXERCISE PROGRAM: [place] Access Code: [email or web address] URL: [email or web address] Date: 01/18/2024  ASSESSMENT:  CLINICAL IMPRESSION: Coila was able to do all activities with minimal discomfort today.  Her gait speed and step length were much improved.  She is still debating over the injections for her back but has that scheduled.  She would benefit from continued skilled PT for quad rehab and  hip stability.  Pt is progressing gradually towards established goals.    From eval: Patient is a 58 y.o. female who was seen today for physical therapy evaluation and treatment for left sided back pain and left sciatica. No current symptoms below left lateral hip.  No neural signs.  Chronic knee pain with limited ROM may have contributed to recent exacerbation of left back and hip pain.  The patient would benefit from PT to address trunk and hip range of motion deficits, strength asymmetries in lumbo/pelvic and hip regions and pain levels that are currently affecting activities of daily living including standing for cooking, sleeping, walking in larger stores like [employer], and doing housework like cleaning the tub.  OBJECTIVE IMPAIRMENTS: decreased activity tolerance, difficulty walking, decreased ROM, decreased strength, impaired perceived functional ability, and pain.   ACTIVITY LIMITATIONS: lifting, bending, standing, squatting, sleeping, and locomotion level  PARTICIPATION LIMITATIONS: meal prep, cleaning, shopping, and community activity  PERSONAL FACTORS: Time since onset of injury/illness/exacerbation and 1-2 comorbidities: HTN, multi body regions affected  are also affecting patient's functional outcome.   REHAB POTENTIAL: Good  CLINICAL DECISION MAKING: Stable/uncomplicated  EVALUATION COMPLEXITY: Low   GOALS: Goals reviewed with patient? Yes  SHORT TERM GOALS: Target date:  02/15/2024   The patient will demonstrate knowledge of basic self care strategies and exercises to promote healing  Baseline: Goal status: In progress  2.  The patient will report a 30% improvement in pain levels with functional activities which are currently difficult including sleeping, cooking and housework Baseline: pt reports 10% on 02/08/24 Goal status: In progress  3.  The patient will have improved hip strength to at least 4/5 needed for standing, walking longer distances and descending stairs at home  Baseline:  Goal status: In progress      LONG TERM GOALS: Target date: 03/14/2024    The patient will be independent in a safe self progression of a home exercise program to promote further recovery of function  Baseline:  Goal status: INITIAL  2.  The patient will have improved trunk flexor and extensor muscle strength to at least 4+/5 needed for housework and carrying grocery bags Baseline:  Goal status: INITIAL  3.  Patient will have LE strength and stamina to be able to walk around [employer] pushing the shopping cart Baseline:  Goal status: INITIAL  4.  The patient will report a 60% improvement in left back and hip pain levels with functional activities which are currently including sleeping, cooking and housework Baseline:  Goal status: INITIAL  5.  Modified Oswestry Index improved to   14%  indicating improved function with less pain Baseline:  Goal status: INITIAL   PLAN:  PT FREQUENCY: 2x/week  PT DURATION: 8 weeks  PLANNED INTERVENTIONS: 97164- PT Re-evaluation, 97110-Therapeutic exercises, 97530- Therapeutic activity, 97112- Neuromuscular re-education, 97535- Self Care, [id number]- Manual therapy, [id number]- Aquatic Therapy, [id number]- Electrical stimulation (unattended), [id number]- Electrical stimulation (manual), [id number]- Ultrasound, [id number]- Traction (mechanical), [id number]- Ionotophoresis 4mg /ml Dexamethasone, Patient/Family education, Stair training, Taping, Dry Needling, Joint  mobilization, Spinal manipulation, Spinal mobilization, Cryotherapy, and Moist heat.  PLAN FOR NEXT SESSION: Emphasize compliance with HEP, discuss weight loss, Progress as appropriate targeting core and hip strengthening; aquatic PT secondary to chronic knee issues and back issues sensitive to load; Nu-Step or bike.   Bridgette Campus B. Theador Jezewski, PT 03/07/24 1:29 PM [facility] [address] [place], [place] [address] Phone # [phone] Fax [phone]

## 2024-03-07 NOTE — Telephone Encounter (Signed)
 Pt states Dr Tresia Fruit told her she would begin managing her amLODipine (NORVASC) 5 MG tablet , pt is asking that this be called into [facility] [phone]

## 2024-03-07 NOTE — Telephone Encounter (Signed)
 I called pt.  I relayed that we do not typically manage blood pressure medications.  That is primary care.  I did not see any mention of this is our OV note.  We have not prescribed that from what I see in most recent and past prescription history.  I instructed Linda Cunningham to call the pharmacy and ask who is Linda Cunningham prescribing MD or she may need to contact Linda Cunningham pcp.  She verbalized understanding.

## 2024-03-11 ENCOUNTER — Encounter (HOSPITAL_BASED_OUTPATIENT_CLINIC_OR_DEPARTMENT_OTHER): Payer: Self-pay | Admitting: Physical Therapy

## 2024-03-11 ENCOUNTER — Ambulatory Visit (HOSPITAL_BASED_OUTPATIENT_CLINIC_OR_DEPARTMENT_OTHER): Attending: Family Medicine | Admitting: Physical Therapy

## 2024-03-11 DIAGNOSIS — M25562 Pain in left knee: Secondary | ICD-10-CM | POA: Insufficient documentation

## 2024-03-11 DIAGNOSIS — G8929 Other chronic pain: Secondary | ICD-10-CM | POA: Diagnosis present

## 2024-03-11 DIAGNOSIS — R262 Difficulty in walking, not elsewhere classified: Secondary | ICD-10-CM | POA: Insufficient documentation

## 2024-03-11 DIAGNOSIS — R252 Cramp and spasm: Secondary | ICD-10-CM | POA: Insufficient documentation

## 2024-03-11 DIAGNOSIS — M6281 Muscle weakness (generalized): Secondary | ICD-10-CM | POA: Diagnosis present

## 2024-03-11 NOTE — Therapy (Signed)
 OUTPATIENT PHYSICAL THERAPY THORACOLUMBAR TREATMENT   Patient Name: Linda Cunningham MRN: [id number] DOB:1966/07/15, 58 y.o., female Today's Date: 03/11/2024  END OF SESSION:  PT End of Session - 03/11/24 1238     Visit Number 13    Date for PT Re-Evaluation 03/14/24    Authorization Type [employer]    PT Start Time 1145    PT Stop Time 1225    PT Time Calculation (min) 40 min    Activity Tolerance Patient tolerated treatment well    Behavior During Therapy [facility] for tasks assessed/performed               Past Medical History:  Diagnosis Date   Abnormal nuclear stress test 09/25/2015   Back pain    radiating to leg on L side   Chest pain 01/05/2015   Epigastric pain    Essential hypertension, benign 12/11/2013   Hypertension    Migraine    OSA on CPAP    Followed by Dr. Gilford Labs with [facility]   Pain of upper extremity 01/07/2020   Pseudotumor cerebri    SOB (shortness of breath) 09/16/2016   Past Surgical History:  Procedure Laterality Date   CARDIAC CATHETERIZATION N/A 09/25/2015   Procedure: Left Heart Cath and Coronary Angiography;  Surgeon: Peter M Swaziland, MD;  Location: [facility] INVASIVE CV LAB;  Service: Cardiovascular;  Laterality: N/A;   CESAREAN SECTION     Patient Active Problem List   Diagnosis Date Noted   Intractable migraine without aura and without status migrainosus 02/04/2024   IIH (idiopathic intracranial hypertension) 02/04/2024   Pain of upper extremity 01/07/2020   SOB (shortness of breath) 09/16/2016   OSA on CPAP    Abnormal nuclear stress test 09/25/2015   Chest pain 01/05/2015   Essential hypertension, benign 12/11/2013    PCP: Maximo Spar MD  REFERRING PROVIDER: Maximo Spar MD  REFERRING DIAG: M54.42 left sided low back pain with left sided sciatica  Rationale for Evaluation and Treatment: Rehabilitation  THERAPY DIAG:  Left low back pain with left-sided sciatica; weakness  ONSET DATE: 3 weeks ago  SUBJECTIVE:                                                                                                                                                                                            SUBJECTIVE STATEMENT: Pt reports she had injections yesterday in both knees.  She reports she is doing so much better today.  She walks in with cane today and her gait is significantly less antalgic.  She states that she will be going by [facility] today to get signed  up for classes.  She brings in new cane today to try out.    POOL ACCESS:  her apt complex has outdoor pool.  May look into going to [facility] for additional pool access.   PERTINENT HISTORY:  HTN; chronic knee issues; chronic back issues on/off over the years;  had sciatica during pregnancy Had water ex many years ago Had PT at [facility] last April, May, June for back and leg weakness   PAIN:  03/07/24 Are you having pain? Yes NPRS scale: 4/10 back; 3-4/10 Knees  Pain location: see above Pain orientation:  PAIN TYPE: aching  Pain description: intermittent  Aggravating factors: cleaning the tub, lying down/sleep Unsure how walking is Relieving factors: sitting OK    PRECAUTIONS: None   WEIGHT BEARING RESTRICTIONS: No  FALLS:  Has patient fallen in last 6 months? No  LIVING ENVIRONMENT: Lives with: boyfriend Lives in: House/apartment lives on 2nd floor    OCCUPATION: none  PLOF: Independent  PATIENT GOALS: go back to normal; go to the store (avoiding b/c it takes 2 1/2 hours [employer])  my knees limit me; I'd like to start exercising (complex has a small compact-TM, Elliptical hurts)  OBJECTIVE:  Note: Objective measures were completed at Evaluation unless otherwise noted.  DIAGNOSTIC FINDINGS:  None recently  PATIENT SURVEYS:   Initial eval: Modified Oswestry 20%    02/08/24:  Modified Oswestry 11/50= 22%  COGNITION: Overall cognitive status: Within functional limits for tasks assessed      POSTURE: pelvic drop with attempted  SLS on right and left 3-4 sec   PALPATION: No tender points  LUMBAR ROM:   AROM eval  Flexion Fingers to ankles mild pain  Extension 20  Right lateral flexion 20  Left lateral flexion 20  Right rotation   Left rotation    (Blank rows = not tested)  TRUNK STRENGTH:  Decreased activation of transverse abdominus muscles; abdominals 4-/5; decreased activation of lumbar multifidi; trunk extensors 4-/5  LOWER EXTREMITY ROM:  bil hip flexion 90 degrees; full knee extension, bil knee flexion 90 degrees  LOWER EXTREMITY MMT:  left hip abduction 3+/5, right hip abduction 4-/5; hip extension 4-/5, quads 4-/5  LUMBAR SPECIAL TESTS:  Negative slump test   FUNCTIONAL TESTS:  Able to STS without hands but it's a struggle Able to bend over (straight knees ) to pick up something from the ground   GAIT: Has a SPC, without cane walks with increased base of support and signs of bil glute weakness  TREATMENT DATE:  [facility] Adult PT Treatment:                                                DATE: 03/11/24 Pt seen for aquatic therapy today.  Treatment took place in water 3.5-4.75 ft in depth at the [facility] pool. Temp of water was 91.  Pt entered/exited the pool via stairs in step-to pattern independently with bilat rail.  - walking unsupported forward/ backward - farmer carry with single yellow hand float at side, walking forward / backward - side stepping with arm addct/ abdct with yellow hand floats - TrA set with single/bilat rainbow hand float pull to front of thighs x 10 each LE - UE on noodle: hamstring curls, alternating, x 10 each LE;   - UE on wall: hip add/abd  x 10 ; heel/toe raises 2  x 10;leg swings into hip flexion / ext; Hip circles (CW/CCW) x 10 each; single leg clams x 10 each - straddling noodle without UE support: cycling, multiple laps  - lower back stretch with feet in 2nd ladder hole, UE On rails (knees to chest and also straightening LEs to tolerance)  - return to  suspended cycling   03/07/24: Instructed in use of new cane and how to adjust (cane was adjusted to lowest setting and may be slightly taller than her other cane)  Gait training with new cane- explained that the canes with the small base have a pistoning/pivot type base to allow for smoother gait transition Nustep x 5 min level 1 (level 1 per patient request) Seated LAQ 2 x 10 each with 2.5 lb ankle weights both Seated march with 2.5 lb ankle weights 2 x 10 each LE Seated hip ER with 2.5 lb ankle weights 2 x 10 each  Standing hamstring curl with 2.5 lb ankle weights  Standing hip abduction 2 x 10 with 2.5 lb ankle weights Lateral band walks with blue loop  Discussed pool options and access to each Educated on why some knee replacement patients are more successful than others: avoid self directing, following protocol.   Ice to bilateral knees and to low back x 10 min at end of session  [facility] Adult PT Treatment:                                                DATE: 03/04/24 Pt seen for aquatic therapy today.  Treatment took place in water 3.5-4.75 ft in depth at the [facility] pool. Temp of water was 91.  Pt entered/exited the pool via stairs in step-to pattern independently with bilat rail.  - farmer carry with bilat rainbow hand float at side, walking forward / backward - side stepping with arm addct/ abdct with rainbow hand floats - UE on wall: hamstring curls, alternating, x 5 each LE;  hip add/abd 2 x 10 ; heel/toe raises x 10; relaxed squats x 10 - return to walking forward/ backward with reciprocal arm swing  - UE on wall:  leg swings into hip flexion /extension x 10;  Hip circles (outward) x 10 each - straddling noodle without UE support: cycling, multiple laps  - TrA set with long hollow noodle-> single/bilat rainbow hand float pull to front of thighs x 10 each LE in staggered stance  02/29/24: Nustep x 5 min level 1 (level 1 per patient request) Seated LAQ 2 x 10 each with  2 lb ankle weights Lengthy discussion about goals and how her pain level has persisted despite therapy.  Discussed needing follow up with ortho MD about her knees as they have worsened since her last follow up.  Spine MD wants to do stimulator for low back and also suggested genicular nerve block.  Suggested she discuss the genicular nerve block with her orthopedist.  Seated march x 20 with 2 lb ankle weights Seated hip ER 2 x 10 with 2 lb ankle weights Supine PPT x 20 Supine SKTC x 5 each LE holding 10 sec each Supine DKTC x 5 holding 10 sec each PPT with heel taps x 20 PPT with alternating arms and legs x 20 Ice to bilateral knees and to low back x 10 min at end of session  01/18/24  Eval  Benefit of aquatic PT for properties of water on joint pain Aquatic PT handouts Initiation of HEP   PATIENT EDUCATION:  Education details: aquatic therapy progressions/ modifications - issued laminated aquatic HEP  Person educated: Patient Education method: Explanation Education comprehension: verbalized understanding  HOME EXERCISE PROGRAM: LAND Access Code: [email or web address] URL: [email or web address] Date: 01/18/2024  AQUATIC access code: [email or web address] URL: [email or web address] This aquatic home exercise program from [employer] utilizes pictures from land based exercises, but has been adapted prior to lamination and issuance.     ASSESSMENT:  CLINICAL IMPRESSION: [place] was instructed in aquatic HEP exercises; issued laminated copy.  All were tolerated well.  Additional notes were made on HEP to assist in remembering form/details.  Pt has reached max potential in pool setting and can continue completing aquatic exercises at [facility] center. Pt to reach out to aquatic therapist with any further questions.  Pt is progressing gradually towards established  goals.    From eval: Patient is a 58 y.o. female who was seen today for physical therapy evaluation and treatment for left sided back pain and left sciatica. No current symptoms below left lateral hip.  No neural signs.  Chronic knee pain with limited ROM may have contributed to recent exacerbation of left back and hip pain.  The patient would benefit from PT to address trunk and hip range of motion deficits, strength asymmetries in lumbo/pelvic and hip regions and pain levels that are currently affecting activities of daily living including standing for cooking, sleeping, walking in larger stores like [employer], and doing housework like cleaning the tub.  OBJECTIVE IMPAIRMENTS: decreased activity tolerance, difficulty walking, decreased ROM, decreased strength, impaired perceived functional ability, and pain.   ACTIVITY LIMITATIONS: lifting, bending, standing, squatting, sleeping, and locomotion level  PARTICIPATION LIMITATIONS: meal prep, cleaning, shopping, and community activity  PERSONAL FACTORS: Time since onset of injury/illness/exacerbation and 1-2 comorbidities: HTN, multi body regions affected  are also affecting patient's functional outcome.   REHAB POTENTIAL: Good  CLINICAL DECISION MAKING: Stable/uncomplicated  EVALUATION COMPLEXITY: Low   GOALS: Goals reviewed with patient? Yes  SHORT TERM GOALS: Target date: 02/15/2024   The patient will demonstrate knowledge of basic self care strategies and exercises to promote healing  Baseline: Goal status: In progress  2.  The patient will report a 30% improvement in pain levels with functional activities which are currently difficult including sleeping, cooking and housework Baseline: pt reports 10% on 02/08/24 Goal status: In progress  3.  The patient will have improved hip strength to at least 4/5 needed for standing, walking longer distances and descending stairs at home  Baseline:  Goal status: In progress      LONG TERM  GOALS: Target date: 03/14/2024    The patient will be independent in a safe self progression of a home exercise program to promote further recovery of function  Baseline:  Goal status: INITIAL  2.  The patient will have improved trunk flexor and extensor muscle strength to at least 4+/5 needed for housework and carrying grocery bags Baseline:  Goal status: INITIAL  3.  Patient will have LE strength and stamina to be able to walk around [employer] pushing the shopping cart Baseline:  Goal status: INITIAL  4.  The patient will report a 60% improvement in left back and hip pain levels with functional activities which are currently including sleeping, cooking and housework Baseline:  Goal status: INITIAL  5.  Modified Oswestry Index improved to   14%  indicating improved function with  less pain Baseline:  Goal status: INITIAL   PLAN:  PT FREQUENCY: 2x/week  PT DURATION: 8 weeks  PLANNED INTERVENTIONS: 97164- PT Re-evaluation, 97110-Therapeutic exercises, 97530- Therapeutic activity, 97112- Neuromuscular re-education, 97535- Self Care, 86578- Manual therapy, [phone]- Aquatic Therapy, [id number]- Electrical stimulation (unattended), [id number]- Electrical stimulation (manual), [id number]- Ultrasound, [id number]- Traction (mechanical), [id number]- Ionotophoresis 4mg /ml Dexamethasone, Patient/Family education, Stair training, Taping, Dry Needling, Joint mobilization, Spinal manipulation, Spinal mobilization, Cryotherapy, and Moist heat.  PLAN FOR NEXT SESSION: Emphasize compliance with HEP, discuss weight loss, Progress as appropriate targeting core and hip strengthening;  Nu-Step or bike.  Almedia Jacobsen, PTA 03/11/24 12:39 PM [facility] [address] [place], [place], [address] Phone: [phone]   Fax:  [phone]

## 2024-03-14 ENCOUNTER — Ambulatory Visit

## 2024-03-14 DIAGNOSIS — R252 Cramp and spasm: Secondary | ICD-10-CM

## 2024-03-14 DIAGNOSIS — M62838 Other muscle spasm: Secondary | ICD-10-CM

## 2024-03-14 DIAGNOSIS — M79604 Pain in right leg: Secondary | ICD-10-CM

## 2024-03-14 DIAGNOSIS — R262 Difficulty in walking, not elsewhere classified: Secondary | ICD-10-CM

## 2024-03-14 DIAGNOSIS — M170 Bilateral primary osteoarthritis of knee: Secondary | ICD-10-CM

## 2024-03-14 DIAGNOSIS — M5459 Other low back pain: Secondary | ICD-10-CM

## 2024-03-14 DIAGNOSIS — R293 Abnormal posture: Secondary | ICD-10-CM

## 2024-03-14 DIAGNOSIS — M6281 Muscle weakness (generalized): Secondary | ICD-10-CM

## 2024-03-14 DIAGNOSIS — M25562 Pain in left knee: Secondary | ICD-10-CM | POA: Diagnosis not present

## 2024-03-14 DIAGNOSIS — M5442 Lumbago with sciatica, left side: Secondary | ICD-10-CM

## 2024-03-14 DIAGNOSIS — G8929 Other chronic pain: Secondary | ICD-10-CM

## 2024-03-14 NOTE — Therapy (Signed)
 OUTPATIENT PHYSICAL THERAPY THORACOLUMBAR TREATMENT PHYSICAL THERAPY DISCHARGE SUMMARY  Visits from Start of Care: 14  Current functional level related to goals / functional outcomes: See below   Remaining deficits: See below   Education / Equipment: See below   Patient agrees to discharge. Patient goals were partially met. Patient is being discharged due to maximized rehab potential.     Patient Name: Linda Cunningham MRN: [id number] DOB:[birth year], 58 y.o., female Today's Date: 03/14/2024  END OF SESSION:  PT End of Session - 03/14/24 1105     Visit Number 14    Date for PT Re-Evaluation 03/14/24    Authorization Type [employer] Medicaid    PT Start Time 1105    PT Stop Time 1145    PT Time Calculation (min) 40 min    Activity Tolerance Patient tolerated treatment well    Behavior During Therapy [facility] for tasks assessed/performed               Past Medical History:  Diagnosis Date   Abnormal nuclear stress test 09/25/2015   Back pain    radiating to leg on L side   Chest pain 01/05/2015   Epigastric pain    Essential hypertension, benign 12/11/2013   Hypertension    Migraine    OSA on CPAP    Followed by Dr. Gilford Labs with [facility]   Pain of upper extremity 01/07/2020   Pseudotumor cerebri    SOB (shortness of breath) 09/16/2016   Past Surgical History:  Procedure Laterality Date   CARDIAC CATHETERIZATION N/A 09/25/2015   Procedure: Left Heart Cath and Coronary Angiography;  Surgeon: Peter M [place]az[place], MD;  Location: [facility] INVASIVE CV LAB;  Service: Cardiovascular;  Laterality: N/A;   CESAREAN SECTION     Patient Active Problem List   Diagnosis Date Noted   Intractable migraine without aura and without status migrainosus 02/04/2024   IIH (idiopathic intracranial hypertension) 02/04/2024   Pain of upper extremity 01/07/2020   SOB (shortness of breath) 09/16/2016   OSA on CPAP    Abnormal nuclear stress test 09/25/2015   Chest pain 01/05/2015   Essential  hypertension, benign 12/11/2013    PCP: Maximo Spar MD  REFERRING PROVIDER: Maximo Spar MD  REFERRING DIAG: M54.42 left sided low back pain with left sided sciatica  Rationale for Evaluation and Treatment: Rehabilitation  THERAPY DIAG:  Left low back pain with left-sided sciatica; weakness  ONSET DATE: 3 weeks ago  SUBJECTIVE:  SUBJECTIVE STATEMENT: Patient reports she is still feeling good post injections.  She has been able to go do short grocery runs but has not done any of the big shopping trips yet.  She has not worked out at the [employer] at her apartment complex because "it's just so small" and " I don't know how to work most of the equipment".  She has been checking out the [facility] near her house but states that the parking lot always looks so crowded and she is concerned that she will not find a parking place close enough.     POOL ACCESS:  her apt complex has outdoor pool.  May look into going to [facility] for additional pool access.   PERTINENT HISTORY:  HTN; chronic knee issues; chronic back issues on/off over the years;  had sciatica during pregnancy Had water ex many years ago Had PT at [facility] last April, May, June for back and leg weakness   PAIN:  03/14/24 Are you having pain? Yes NPRS scale: 0-1/10 back; 0-2/10 Knees  Pain location: see above Pain orientation:  PAIN TYPE: aching  Pain description: intermittent  Aggravating factors: cleaning the tub, lying down/sleep Unsure how walking is Relieving factors: sitting OK    PRECAUTIONS: None   WEIGHT BEARING RESTRICTIONS: No  FALLS:  Has patient fallen in last 6 months? No  LIVING ENVIRONMENT: Lives with: boyfriend Lives in: House/apartment lives on 2nd floor    OCCUPATION: none  PLOF: Independent  PATIENT GOALS: go  back to normal; go to the store (avoiding b/c it takes 2 1/2 hours [employer])  my knees limit me; I'd like to start exercising (complex has a small compact-TM, Elliptical hurts)  OBJECTIVE:  Note: Objective measures were completed at Evaluation unless otherwise noted.  DIAGNOSTIC FINDINGS:  None recently  PATIENT SURVEYS:   Initial eval: Modified Oswestry 20%    02/08/24:  Modified Oswestry 11/50= 22%   03/14/24:  Modified Oswestry 13/50= 26%  COGNITION: Overall cognitive status: Within functional limits for tasks assessed      POSTURE: pelvic drop with attempted SLS on right and left 3-4 sec   PALPATION: No tender points  LUMBAR ROM:   AROM eval 03/14/24  Flexion Fingers to ankles mild pain WNL  Extension 20 50%  Right lateral flexion 20 WFL  Left lateral flexion 20 WFL  Right rotation  WFL with discomfort  Left rotation  WFL with discomfort   (Blank rows = not tested)  TRUNK STRENGTH:  Decreased activation of transverse abdominus muscles; abdominals 4-/5; decreased activation of lumbar multifidi; trunk extensors 4-/5  LOWER EXTREMITY ROM:  bil hip flexion 90 degrees; full knee extension, bil knee flexion 90 degrees  LOWER EXTREMITY MMT:  left hip abduction 3+/5, right hip abduction 4-/5; hip extension 4-/5, quads 4-/5  LUMBAR SPECIAL TESTS:  Negative slump test   FUNCTIONAL TESTS:  Able to STS without hands but it's a struggle Able to bend over (straight knees ) to pick up something from the ground   03/14/24:  5 times sit to stand: 16.51 sec  TUG: 9.48 sec  GAIT: Has a SPC, without cane walks with increased base of support and signs of bil glute weakness  TREATMENT DATE:  03/14/24: DC assessment completed Reviewed all of HEP Educated on importance of getting established at a gym or aquatics program to be able to lose weight and be a candidate for knee replacement.   [facility] Adult PT Treatment:  DATE: 03/11/24 Pt seen  for aquatic therapy today.  Treatment took place in water 3.5-4.75 ft in depth at the [facility] pool. Temp of water was 91.  Pt entered/exited the pool via stairs in step-to pattern independently with bilat rail.  - walking unsupported forward/ backward - farmer carry with single yellow hand float at side, walking forward / backward - side stepping with arm addct/ abdct with yellow hand floats - TrA set with single/bilat rainbow hand float pull to front of thighs x 10 each LE - UE on noodle: hamstring curls, alternating, x 10 each LE;   - UE on wall: hip add/abd  x 10 ; heel/toe raises 2 x 10;leg swings into hip flexion / ext; Hip circles (CW/CCW) x 10 each; single leg clams x 10 each - straddling noodle without UE support: cycling, multiple laps  - lower back stretch with feet in 2nd ladder hole, UE On rails (knees to chest and also straightening LEs to tolerance)  - return to suspended cycling   03/07/24: Instructed in use of new cane and how to adjust (cane was adjusted to lowest setting and may be slightly taller than her other cane)  Gait training with new cane- explained that the canes with the small base have a pistoning/pivot type base to allow for smoother gait transition Nustep x 5 min level 1 (level 1 per patient request) Seated LAQ 2 x 10 each with 2.5 lb ankle weights both Seated march with 2.5 lb ankle weights 2 x 10 each LE Seated hip ER with 2.5 lb ankle weights 2 x 10 each  Standing hamstring curl with 2.5 lb ankle weights  Standing hip abduction 2 x 10 with 2.5 lb ankle weights Lateral band walks with blue loop  Discussed pool options and access to each Educated on why some knee replacement patients are more successful than others: avoid self directing, following protocol.   Ice to bilateral knees and to low back x 10 min at end of session  [facility] Adult PT Treatment:                                                DATE: 03/04/24 Pt seen for aquatic therapy today.   Treatment took place in water 3.5-4.75 ft in depth at the [facility] pool. Temp of water was 91.  Pt entered/exited the pool via stairs in step-to pattern independently with bilat rail.  - farmer carry with bilat rainbow hand float at side, walking forward / backward - side stepping with arm addct/ abdct with rainbow hand floats - UE on wall: hamstring curls, alternating, x 5 each LE;  hip add/abd 2 x 10 ; heel/toe raises x 10; relaxed squats x 10 - return to walking forward/ backward with reciprocal arm swing  - UE on wall:  leg swings into hip flexion /extension x 10;  Hip circles (outward) x 10 each - straddling noodle without UE support: cycling, multiple laps  - TrA set with long hollow noodle-> single/bilat rainbow hand float pull to front of thighs x 10 each LE in staggered stance  02/29/24: Nustep x 5 min level 1 (level 1 per patient request) Seated LAQ 2 x 10 each with 2 lb ankle weights Lengthy discussion about goals and how her pain level has persisted despite therapy.  Discussed needing follow up with ortho MD  about her knees as they have worsened since her last follow up.  Spine MD wants to do stimulator for low back and also suggested genicular nerve block.  Suggested she discuss the genicular nerve block with her orthopedist.  Seated march x 20 with 2 lb ankle weights Seated hip ER 2 x 10 with 2 lb ankle weights Supine PPT x 20 Supine SKTC x 5 each LE holding 10 sec each Supine DKTC x 5 holding 10 sec each PPT with heel taps x 20 PPT with alternating arms and legs x 20 Ice to bilateral knees and to low back x 10 min at end of session  01/18/24                                                                                                                              Eval  Benefit of aquatic PT for properties of water on joint pain Aquatic PT handouts Initiation of HEP   PATIENT EDUCATION:  Education details: aquatic therapy progressions/ modifications - issued  laminated aquatic HEP  Person educated: Patient Education method: Explanation Education comprehension: verbalized understanding  HOME EXERCISE PROGRAM: LAND Access Code: [email or web address] URL: [email or web address] Date: 01/18/2024  AQUATIC access code: [email or web address] URL: [email or web address] This aquatic home exercise program from Med[employer] utilizes pictures from land based exercises, but has been adapted prior to lamination and issuance.     ASSESSMENT:  CLINICAL IMPRESSION: Linda Cunningham has met max potential for formal PT.  She has been encouraged to establish an aquatics or gym membership for several weeks.  She has not done this yet.  Her self disability rating has worsened over the last 3 tests.  She has been significantly more functional since receiving injections but lacks motivation and compliance with establishing a plan outside of formal PT as she was encouraged to do so.  She will be discharged from ortho PT at this time due to max potential met.      From eval: Patient is a 58 y.o. female who was seen today for physical therapy evaluation and treatment for left sided back pain and left sciatica. No current symptoms below left lateral hip.  No neural signs.  Chronic knee pain with limited ROM may have contributed to recent exacerbation of left back and hip pain.  The patient would benefit from PT to address trunk and hip range of motion deficits, strength asymmetries in lumbo/pelvic and hip regions and pain levels that are currently affecting activities of daily living including standing for cooking, sleeping, walking in larger stores like [employer], and doing housework like cleaning the tub.  OBJECTIVE IMPAIRMENTS: decreased activity tolerance, difficulty walking, decreased ROM, decreased strength, impaired perceived functional ability, and pain.   ACTIVITY LIMITATIONS: lifting, bending, standing, squatting, sleeping, and locomotion level  PARTICIPATION LIMITATIONS:  meal prep, cleaning, shopping, and community activity  PERSONAL FACTORS: Time since onset of injury/illness/exacerbation and 1-2 comorbidities: HTN, multi body regions affected are also affecting patient's functional outcome.  REHAB POTENTIAL: Good  CLINICAL DECISION MAKING: Stable/uncomplicated  EVALUATION COMPLEXITY: Low   GOALS: Goals reviewed with patient? Yes  SHORT TERM GOALS: Target date: 02/15/2024   The patient will demonstrate knowledge of basic self care strategies and exercises to promote healing  Baseline: Goal status: In progress  2.  The patient will report a 30% improvement in pain levels with functional activities which are currently difficult including sleeping, cooking and housework Baseline: pt reports 10% on 02/08/24 Goal status: In progress  3.  The patient will have improved hip strength to at least 4/5 needed for standing, walking longer distances and descending stairs at home  Baseline:  Goal status: In progress      LONG TERM GOALS: Target date: 03/14/2024    The patient will be independent in a safe self progression of a home exercise program to promote further recovery of function  Baseline:  Goal status: INITIAL  2.  The patient will have improved trunk flexor and extensor muscle strength to at least 4+/5 needed for housework and carrying grocery bags Baseline:  Goal status: INITIAL  3.  Patient will have LE strength and stamina to be able to walk around [employer] pushing the shopping cart Baseline:  Goal status: INITIAL  4.  The patient will report a 60% improvement in left back and hip pain levels with functional activities which are currently including sleeping, cooking and housework Baseline:  Goal status: INITIAL  5.  Modified Oswestry Index improved to   14%  indicating improved function with less pain Baseline:  Goal status: INITIAL   PLAN:  PT FREQUENCY: 2x/week  PT DURATION: 8 weeks  PLANNED INTERVENTIONS: 97164- PT  Re-evaluation, 97110-Therapeutic exercises, 97530- Therapeutic activity, 97112- Neuromuscular re-education, 97535- Self Care, 82956- Manual therapy, [id number]- Aquatic Therapy, O1308- Electrical stimulation (unattended), [id number]- Electrical stimulation (manual), [id number]- Ultrasound, [id number]- Traction (mechanical), [id number]- Ionotophoresis 4mg /ml Dexamethasone, Patient/Family education, Stair training, Taping, Dry Needling, Joint mobilization, Spinal manipulation, Spinal mobilization, Cryotherapy, and Moist heat.  PLAN FOR NEXT SESSION: DC due max potential.  Shade Kaley B. Allesandra Huebsch, PT 03/14/24 9:15 PM [facility] [address] [place], [place] [address] Phone # [phone] Fax [phone]

## 2024-03-18 ENCOUNTER — Encounter: Payer: Self-pay | Admitting: Cardiology

## 2024-03-18 ENCOUNTER — Ambulatory Visit: Attending: Cardiology | Admitting: Cardiology

## 2024-03-18 VITALS — BP 130/87 | HR 85 | Ht 59.0 in

## 2024-03-18 DIAGNOSIS — Z79899 Other long term (current) drug therapy: Secondary | ICD-10-CM | POA: Diagnosis present

## 2024-03-18 DIAGNOSIS — G4733 Obstructive sleep apnea (adult) (pediatric): Secondary | ICD-10-CM | POA: Diagnosis not present

## 2024-03-18 DIAGNOSIS — E785 Hyperlipidemia, unspecified: Secondary | ICD-10-CM | POA: Insufficient documentation

## 2024-03-18 DIAGNOSIS — I10 Essential (primary) hypertension: Secondary | ICD-10-CM | POA: Diagnosis present

## 2024-03-18 DIAGNOSIS — R0602 Shortness of breath: Secondary | ICD-10-CM | POA: Diagnosis not present

## 2024-03-18 DIAGNOSIS — I2583 Coronary atherosclerosis due to lipid rich plaque: Secondary | ICD-10-CM | POA: Insufficient documentation

## 2024-03-18 DIAGNOSIS — I2510 Atherosclerotic heart disease of native coronary artery without angina pectoris: Secondary | ICD-10-CM | POA: Insufficient documentation

## 2024-03-18 LAB — LIPID PANEL

## 2024-03-18 NOTE — Patient Instructions (Signed)
 Medication Instructions:  Your physician recommends that you continue on your current medications as directed. Please refer to the Current Medication list given to you today.  *If you need a refill on your cardiac medications before your next appointment, please call your pharmacy*  Lab Work: Please complete a CMET and FASTING lipid panel today at our lab on the first floor before you leave.   If you have labs (blood work) drawn today and your tests are completely normal, you will receive your results only by: MyChart Message (if you have MyChart) OR A paper copy in the mail If you have any lab test that is abnormal or we need to change your treatment, we will call you to review the results.  Testing/Procedures: None.  Follow-Up: At [facility], you and your health needs are our priority.  As part of our continuing mission to provide you with exceptional heart care, our providers are all part of one team.  This team includes your primary Cardiologist (physician) and Advanced Practice Providers or APPs (Physician Assistants and Nurse Practitioners) who all work together to provide you with the care you need, when you need it.  Your next appointment:   1 year(s)  Provider:   Gaylyn Keas, MD    We recommend signing up for the patient portal called "MyChart".  Sign up information is provided on this After Visit Summary.  MyChart is used to connect with patients for Virtual Visits (Telemedicine).  Patients are able to view lab/test results, encounter notes, upcoming appointments, etc.  Non-urgent messages can be sent to your provider as well.   To learn more about what you can do with MyChart, go to [email or web address].

## 2024-03-18 NOTE — Addendum Note (Signed)
 Addended by: Cherylyn Cos on: 03/18/2024 10:05 AM   Modules accepted: Orders

## 2024-03-18 NOTE — Progress Notes (Signed)
 Cardiology Office  Note    Date:  03/18/2024   ID:  MALISE YANIK, DOB [birth year], MRN [id number]  PCP:  Claudell Cruz, MD  Cardiologist:  Gaylyn Keas, MD   Chief Complaint  Patient presents with   Coronary Artery Disease   Hypertension   Hyperlipidemia   Sleep Apnea   Shortness of Breath    History of Present Illness:  Linda Cunningham is a 58yo obese female with a hx of HTN, GERD, OSA on PAP, Pseudotumor cerebri with a hx of Chest discomfort that she described as feeling like she had a cold in her chest.  She had an evaluation for chest pain in 2016 including LHC showing normal coronary arteries and normal LVF.   2D echo showed normal LVF with mild LVH and trivial MR.    She is here today for followup and is doing well.  She tells me that occasionally she will have a small pain in her midsternal region that does not radiate.  It feels sharp and only lasts a few seconds and goes away.  It mainly occurs with certain movements she does.  She had some LE edema when on a trip to [place] and had eaten fast food and had driven but that resolved. She denies any exertional chest pain or pressure, SOB, DOE, PND, orthopnea, dizziness (except for vertigo), palpitations or syncope. She is compliant with her meds and is tolerating meds with no SE.    Past Medical History:  Diagnosis Date   Abnormal nuclear stress test 09/25/2015   Back pain    radiating to leg on L side   Chest pain 01/05/2015   Epigastric pain    Essential hypertension, benign 12/11/2013   Hypertension    Migraine    OSA on CPAP    Followed by Dr. Gilford Labs with [facility]   Pain of upper extremity 01/07/2020   Pseudotumor cerebri    SOB (shortness of breath) 09/16/2016    Past Surgical History:  Procedure Laterality Date   CARDIAC CATHETERIZATION N/A 09/25/2015   Procedure: Left Heart Cath and Coronary Angiography;  Surgeon: Peter M Swaziland, MD;  Location: [facility] INVASIVE CV LAB;  Service: Cardiovascular;  Laterality:  N/A;   CESAREAN SECTION      Current Medications: Current Meds  Medication Sig   acetaminophen (TYLENOL) 325 MG tablet Take 650 mg by mouth every 6 (six) hours as needed.   amLODipine (NORVASC) 5 MG tablet Take 5 mg by mouth every morning.    atorvastatin  (LIPITOR) 20 MG tablet Take 1 tablet (20 mg total) by mouth daily.   baclofen  (LIORESAL ) 10 MG tablet Take 1 tablet (10 mg total) by mouth 3 (three) times daily.   cyclobenzaprine  (FLEXERIL ) 5 MG tablet Take 1 tablet (5 mg total) by mouth 2 (two) times daily as needed for muscle spasms. (Patient taking differently: Take 5 mg by mouth at bedtime.)   diclofenac (VOLTAREN) 75 MG EC tablet Take 75 mg by mouth as needed.   Erenumab -aooe (AIMOVIG ) 140 MG/ML SOAJ Inject 140 mg into the skin every 30 (thirty) days.   famotidine (PEPCID) 20 MG tablet Take 20 mg by mouth daily as needed for heartburn or indigestion.    fluticasone (FLONASE) 50 MCG/ACT nasal spray Place 1 spray into the nose daily as needed for allergies.   ibuprofen  (ADVIL ) 600 MG tablet Take by mouth.   losartan (COZAAR) 100 MG tablet Take 100 mg by mouth every morning.    montelukast (SINGULAIR)  10 MG tablet Take 1 tablet by mouth daily.   pantoprazole (PROTONIX) 40 MG tablet Take 1 tablet by mouth 2 (two) times daily.   topiramate  (TOPAMAX ) 100 MG tablet Take 1 tablet (100 mg total) by mouth daily.   triamcinolone cream (KENALOG) 0.1 % As needed   VAGIFEM 10 MCG TABS vaginal tablet Place 1 tablet vaginally 2 (two) times a week.    Allergies:   Amlodipine besy-benazepril hcl, Dust mite extract, Other, Amlodipine besy-benazepril hcl, Lactose intolerance (gi), and Tilactase   Social History   Socioeconomic History   Marital status: Single    Spouse name: Not on file   Number of children: 1   Years of education: Not on file   Highest education level: Associate degree: occupational, Scientist, product/process development, or vocational program  Occupational History    Comment: home maker  Tobacco Use    Smoking status: Never   Smokeless tobacco: Never  Substance and Sexual Activity   Alcohol use: No   Drug use: No   Sexual activity: Not on file  Other Topics Concern   Not on file  Social History Narrative   Lives with boyfriend and son   Caffeine- tea 1-2 x week   Social Drivers of Corporate investment banker Strain: Low Risk  (01/22/2024)   Received from [facility]   Overall Financial Resource Strain (CARDIA)    Difficulty of Paying Living Expenses: Not hard at all  Food Insecurity: No Food Insecurity (01/22/2024)   [facility]   Hunger Vital Sign    Worried About Running Out of Food in the Last Year: Never true    Ran Out of Food in the Last Year: Never true  Transportation Needs: No Transportation Needs (01/22/2024)   Received from [facility] - Transportation    Lack of Transportation (Medical): No    Lack of Transportation (Non-Medical): No  Physical Activity: Unknown (07/02/2023)   Received from [facility]   Exercise Vital Sign    Days of Exercise per Week: 0 days    Minutes of Exercise per Session: Not on file  Recent Concern: Physical Activity - Inactive (07/02/2023)   Received from [facility]   Exercise Vital Sign    Days of Exercise per Week: 0 days    Minutes of Exercise per Session: 0 min  Stress: No Stress Concern Present (07/02/2023)   Received from [facility] - Occupational Stress Questionnaire    Feeling of Stress : Only a little  Social Connections: Socially Isolated (07/02/2023)   Received from [facility]   Social Network    How would you rate your social network (family, work, friends)?: Little participation, lonely and socially isolated     Family History:  The patient's family history includes Coronary artery disease in her brother; Diabetes in her mother; Hypertension in her brother, mother, and sister; Migraines in her sister.   ROS:   Please see the history of  present illness.    ROS All other systems reviewed and are negative.      No data to display             PHYSICAL EXAM:   VS:  BP 130/87   Pulse 85   Ht 4\' 11"  (1.499 m)   LMP 10/10/2016   SpO2 94%   BMI 49.28 kg/m    GEN: Well nourished, well developed in no acute distress HEENT: Normal NECK: No JVD; No  carotid bruits LYMPHATICS: No lymphadenopathy CARDIAC:RRR, no murmurs, rubs, gallops RESPIRATORY:  Clear to auscultation without rales, wheezing or rhonchi  ABDOMEN: Soft, non-tender, non-distended MUSCULOSKELETAL:  No edema; No deformity  SKIN: Warm and dry NEUROLOGIC:  Alert and oriented x 3 PSYCHIATRIC:  Normal affect   Wt Readings from Last 3 Encounters:  02/01/24 244 lb (110.7 kg)  07/31/23 240 lb 8 oz (109.1 kg)  02/23/23 239 lb (108.4 kg)      Studies/Labs Reviewed:   EKG Interpretation Date/Time:  Monday Mar 18 2024 09:39:24 EDT Ventricular Rate:  85 PR Interval:  132 QRS Duration:  74 QT Interval:  360 QTC Calculation: 428 R Axis:   10  Text Interpretation: Normal sinus rhythm Low voltage QRS Cannot rule out Anterior infarct , age undetermined When compared with ECG of 11-Oct-2016 10:46, PREVIOUS ECG IS PRESENT No significant change since last tracing Confirmed by Gaylyn Keas ([id number]) on 03/18/2024 9:50:03 AM     Recent Labs: No results found for requested labs within last 365 days.   Lipid Panel    Component Value Date/Time   CHOL 131 11/18/2022 1045   TRIG 83 11/18/2022 1045   HDL 60 11/18/2022 1045   CHOLHDL 2.2 11/18/2022 1045   LDLCALC 55 11/18/2022 1045    Additional studies/ records that were reviewed today include:  Office notes from PCP    ASSESSMENT:    1. Coronary artery disease due to lipid rich plaque   2. SOB (shortness of breath)   3. OSA on CPAP   4. Hyperlipidemia, unspecified hyperlipidemia type   5. Benign essential HTN      PLAN:  In order of problems listed above:  1.  Minimal ASCAD -her description in  the past was not really pain but a feeling that she was getting cold in her chest which she still at times has -she had a normal cath in 2016 and 2D echo 01/2020 was normal except for LVH -coronary CTA 9/21 showed minimal pRCA, mLAD and pLCx < 25%. -She denies any anginal symptoms since I saw her last -she occasionally has a sharp pain that lasts a few seconds in the mid sternal region but only with certain movements and not with exertion -Continue amlodipine 5 mg daily, atorvastatin  20 mg daily and aspirin  81 mg daily with as needed refills  2.  SOB -this has resolved -no significant CAD in 2021 -2D echo 01/25/2020 EF 60 to 65% with normal diastolic function and normal RV  3.  OSA -she has not been compliant with her device due to clasutrophobia with the mask -she has not been using her device -encouraged her to try to get back on  4.  HLD -LDL goal < 70 -Continue atorvastatin  20 mg daily with as needed refills -Check FLP and ALT  5.  HTN - BP is adequately controlled on exam today - Continue amlodipine 5 mg daily, losartan 100 mg daily with as needed refills - Check BMET   Medication Adjustments/Labs and Tests Ordered: Current medicines are reviewed at length with the patient today.  Concerns regarding medicines are outlined above.  Medication changes, Labs and Tests ordered today are listed in the Patient Instructions below.  There are no Patient Instructions on file for this visit.   Signed, Gaylyn Keas, MD  03/18/2024 9:46 AM    [facility] [address] [place], [place], [place]  [address] Phone: [phone]; Fax: [phone]

## 2024-03-19 ENCOUNTER — Ambulatory Visit: Payer: Self-pay | Admitting: Cardiology

## 2024-03-19 DIAGNOSIS — E785 Hyperlipidemia, unspecified: Secondary | ICD-10-CM

## 2024-03-19 DIAGNOSIS — Z79899 Other long term (current) drug therapy: Secondary | ICD-10-CM

## 2024-03-19 LAB — LIPID PANEL
Cholesterol, Total: 182 mg/dL (ref 100–199)
HDL: 57 mg/dL (ref >39)
LDL CALC COMMENT:: 3.2 ratio (ref 0.0–4.4)
LDL Chol Calc (NIH): 106 mg/dL — ABNORMAL HIGH (ref 0–99)
Triglycerides: 108 mg/dL (ref 0–149)
VLDL Cholesterol Cal: 19 mg/dL (ref 5–40)

## 2024-03-19 LAB — COMPREHENSIVE METABOLIC PANEL WITH GFR
ALT: 21 IU/L (ref 0–32)
AST: 26 IU/L (ref 0–40)
Albumin: 4.2 g/dL (ref 3.8–4.9)
Alkaline Phosphatase: 112 IU/L (ref 44–121)
BUN/Creatinine Ratio: 16 (ref 9–23)
BUN: 16 mg/dL (ref 6–24)
Bilirubin Total: 0.3 mg/dL (ref 0.0–1.2)
CO2: 18 mmol/L — ABNORMAL LOW (ref 20–29)
Calcium: 9.4 mg/dL (ref 8.7–10.2)
Chloride: 107 mmol/L — ABNORMAL HIGH (ref 96–106)
Creatinine, Ser: 1.02 mg/dL — ABNORMAL HIGH (ref 0.57–1.00)
Globulin, Total: 2.7 g/dL (ref 1.5–4.5)
Glucose: 109 mg/dL — ABNORMAL HIGH (ref 70–99)
Potassium: 4 mmol/L (ref 3.5–5.2)
Sodium: 140 mmol/L (ref 134–144)
Total Protein: 6.9 g/dL (ref 6.0–8.5)
eGFR: 65 mL/min/{1.73_m2} (ref >59)

## 2024-03-21 NOTE — Telephone Encounter (Signed)
 Call to patient to advise that lab work looks normal except LDL is not at goal. Explained that dr. Micael Adas would like patient to increase atorvastatin  to 40 mg daily and repeat FLP and ALT in 6 weeks.   Patient states, "I have frequently been forgetting to take my atorvastatin . I would like to work on getting in the habit of not skipping any doses and then retest my levels in 6 weeks." Lab orders placed, patient responses forwarded to Dr.Turner.

## 2024-03-21 NOTE — Telephone Encounter (Signed)
-----   Message from Gaylyn Keas sent at 03/19/2024 10:57 AM EDT ----- LDL is not at goal.  Please have her increase atorvastatin  to 40 mg daily and repeat FLP and ALT in 6 weeks.  Her other lab work looks normal

## 2024-03-26 NOTE — Telephone Encounter (Signed)
 Call to patient to advise Dr. Micael Adas is in agreement with keeping current dose of atorvastatin  at 20 mg in the hopes patient will take this dose consistently and then labs will be retested in 6 weeks.

## 2024-03-26 NOTE — Telephone Encounter (Signed)
-----   Message from Gaylyn Keas sent at 03/21/2024  5:43 PM EDT ----- Agree with plan ----- Message ----- From: Cherylyn Cos, RN Sent: 03/21/2024   5:28 PM EDT To: Jacqueline Matsu, MD  ----- Message from Cherylyn Cos, RN sent at 03/21/2024  5:28 PM EDT ----- Gisele Lamas plan?

## 2024-04-17 ENCOUNTER — Other Ambulatory Visit: Payer: Self-pay | Admitting: Cardiology

## 2024-04-23 LAB — LIPID PANEL
Chol/HDL Ratio: 3.2 ratio (ref 0.0–4.4)
Cholesterol, Total: 149 mg/dL (ref 100–199)
HDL: 46 mg/dL (ref >39)
LDL Chol Calc (NIH): 79 mg/dL (ref 0–99)
Triglycerides: 136 mg/dL (ref 0–149)
VLDL Cholesterol Cal: 24 mg/dL (ref 5–40)

## 2024-04-23 LAB — ALT: ALT: 14 IU/L (ref 0–32)

## 2024-05-02 NOTE — Telephone Encounter (Signed)
 Call to patient who verifies that her recent cholesterol levels are related to taking 20 mg atorvastatin  and not 40 mg. She previously was not taking her atorvastatin  regularly and asked to try taking the 20 mg every day before increasing to 40 mg. She verbalizes understanding that her LDL improved but is not yet less than 70. She states she feels she has more work to do on her diet/exercise regimen and would like to continue to take 20 mg a little longer and then retest. Patient responses forwarded to Dr. Shlomo.

## 2024-05-08 ENCOUNTER — Other Ambulatory Visit: Payer: Self-pay

## 2024-05-08 DIAGNOSIS — Z79899 Other long term (current) drug therapy: Secondary | ICD-10-CM

## 2024-05-08 DIAGNOSIS — E785 Hyperlipidemia, unspecified: Secondary | ICD-10-CM

## 2024-05-08 MED ORDER — ATORVASTATIN CALCIUM 40 MG PO TABS: 40.0000 mg | ORAL_TABLET | Freq: Every day | ORAL | 3 refills | Status: AC

## 2024-05-08 NOTE — Addendum Note (Signed)
 Addended by: JANIT GENI CROME on: 05/08/2024 09:47 AM   Modules accepted: Orders

## 2024-05-08 NOTE — Telephone Encounter (Signed)
-----   Message from Wilbert Bihari sent at 05/06/2024  9:28 AM EDT ----- She has CAD and the longer her LDL it not at goal the more CAD she will develop and then be at risk for an MI.  I recommend increasing Atorvastatin  to 40mg  daily and repeat FLP and ALT in 6 weeks ----- Message ----- From: Janit Geni CROME, RN Sent: 05/02/2024   3:01 PM EDT To: Wilbert JONELLE Bihari, MD  ----- Message from Geni CROME Janit, RN sent at 05/02/2024  3:01 PM EDT -----   ----- Message ----- From: Bihari Wilbert JONELLE, MD Sent: 04/24/2024   9:09 AM EDT To: Geni CROME Janit, RN  LDL still not at goal.  Please find out if patient increased her atorvastatin  to 40 mg daily and if she has missed any doses ----- Message ----- From: Interface, [facility] Lab Results In Sent: 04/23/2024  11:36 PM EDT To: Wilbert JONELLE Bihari, MD

## 2024-05-08 NOTE — Telephone Encounter (Signed)
 Call to patient to advise that per Dr. Shlomo, she has CAD and the longer her LDL it not at goal the more CAD she will develop and then be at risk for an MI.  Patient agrees to increasing Atorvastatin  to 40mg  daily and repeat FLP and ALT in 6 weeks, orders placed.

## 2024-05-13 ENCOUNTER — Other Ambulatory Visit: Payer: Self-pay | Admitting: Family Medicine

## 2024-05-13 DIAGNOSIS — Z1231 Encounter for screening mammogram for malignant neoplasm of breast: Secondary | ICD-10-CM

## 2024-06-05 ENCOUNTER — Ambulatory Visit
Admission: RE | Admit: 2024-06-05 | Discharge: 2024-06-05 | Disposition: A | Discharge status: Home / Self Care | Source: Ambulatory Visit | Attending: Family Medicine | Admitting: Family Medicine

## 2024-06-05 DIAGNOSIS — Z1231 Encounter for screening mammogram for malignant neoplasm of breast: Secondary | ICD-10-CM

## 2024-07-02 ENCOUNTER — Telehealth: Payer: Self-pay | Admitting: Neurology

## 2024-07-02 NOTE — Telephone Encounter (Signed)
 Patient called to verify time and date.

## 2024-07-15 LAB — LIPID PANEL
Chol/HDL Ratio: 2.1 ratio (ref 0.0–4.4)
Cholesterol, Total: 116 mg/dL (ref 100–199)
HDL: 55 mg/dL (ref >39)
LDL Chol Calc (NIH): 45 mg/dL (ref 0–99)
Triglycerides: 80 mg/dL (ref 0–149)
VLDL Cholesterol Cal: 16 mg/dL (ref 5–40)

## 2024-07-19 ENCOUNTER — Telehealth: Payer: Self-pay | Admitting: Cardiology

## 2024-07-19 NOTE — Telephone Encounter (Signed)
-----   Message from Linda Cunningham sent at 07/16/2024  2:37 PM EDT ----- Lipids at goal continue current therapy and forward to PCP ----- Message ----- From: Interface, [facility] Lab Results In Sent: 07/15/2024  11:41 PM EDT To: Linda JONELLE Bihari, MD

## 2024-07-19 NOTE — Telephone Encounter (Signed)
 Pt called back in about labs, she asked if she should continue taking atorvastatin  40mg ? Please advise.

## 2024-07-19 NOTE — Telephone Encounter (Signed)
 Call to patient to advise that Lipids at goal and to continue current therapy. Patient verbalizes understanding. Results forwarded to PCP.

## 2024-07-31 NOTE — Telephone Encounter (Signed)
 Call to patient to confirm she needs to stay on her current dose of 40 mg atorvastatin  as her cholesterol #'s improved on this dose. Patient verbalizes understanding.

## 2024-08-06 NOTE — Progress Notes (Unsigned)
 Patient: Linda Cunningham Date of Birth: 1966/07/26  Reason for Visit: Follow up History from: Patient Primary Neurologist: Ahern/Penumalli  Virtual Visit via Video Note  I connected with Linda Cunningham on 08/07/24 at  3:30 PM EDT by a video enabled telemedicine application and verified that I am speaking with the correct person using two identifiers.  Location: Patient: at her home Provider: in the office    I discussed the limitations of evaluation and management by telemedicine and the availability of in person appointments. The patient expressed understanding and agreed to proceed.  ASSESSMENT AND PLAN 58 y.o. year old female   IIH Chronic migraines   - Continue Aimovig  140 mg monthly injection for migraine prevention - Recommend Topamax  100 mg daily for migraine prevention/IIH - I will get recent records from ophthalmology evaluation, reportedly was normal without papilledema - For acute migraine may take baclofen , combine with NSAID (kidney function normal recently) or Tylenol - Follow-up in 6 months or sooner if needed  Prior Therapies                                  Prevention: Aimovig  140 mg monthly Emgality  120 mg monthly - constipation Topamax  100 mg QHS Amlodipine 5 mg daily Losartan 100 mg daily   Rescue: Maxalt 10 mg PRN Nurtec - lack of efficacy Ubrelvy  - lack of efficacy Baclofen  10 mg TID PRN She has been told to avoid triptans due to HTN per cardiology and to avoid NSAIDs due to kidney function  HISTORY OF PRESENT ILLNESS: Today 08/06/24 Update August 07, 2024 SS: via VV. Remains on Aimovig  140 mg, hasn't tried the Zavzpret . Remains on Topamax  100 mg daily, takes sporadically, few days a week. Went to see eye doctor August, was told normal, no papilledema, Dr. Charmayne. Many years since LP, 2016. For headache rescue uses Tylenol, baclofen . On average 2-3 migraines a month. Mild to moderate headache 3-4 days a month. Wonders about which combination she  should use for rescue.   HISTORY  02/01/2024: 58 year old female with a history of IIH, migraines, OSA, HTN, HLD who follows in clinic for migraines and IIH.  This is a patient of my former colleague Dr. Rush who is transitioning to my care. She is on Topiramate  and at last visit Aimovig  was prescribed (Emgality  stopped being covered by insurance). She did not like the rizatriptan, Nurtec and Ubrelvy  were ineffective. Headaches will come out of the blue. She is not taking the topiramate  every day, advised her to take it daily for migraine AND IDIOPATHIC INTRACRANIAL HYPERTENSION. Sees ophthalmologist and she states is stable and not concerning for papilledema. Aimovig  is being covered by insurance but doing well as far as migraines go, she occ gets a migraine, she has not tried the zavzpret . She has knee and back problems. Her blood pressure is elevated and that can cause headaches as well, she sees a spine doctor. Take BP at home several times a day. No papilledema today. She sees Dr. Charmayne once a year next in July this year. Hsn;t taken the topiramate  for 2 weeks, advised her. Last LP was 2016 and hasn;t had an MRI for many years but considering doing well, no papilledema, neuro exam,   REVIEW OF SYSTEMS: Out of a complete 14 system review of symptoms, the patient complains only of the following symptoms, and all other reviewed systems are negative.  See HPI  ALLERGIES:  Allergies  Allergen Reactions   Amlodipine Besy-Benazepril Hcl Swelling    Lips swell Lips swell Lips swell   Dust Mite Extract Other (See Comments)    Sneezing    Other Rash    Pt reports EKG stickers leaves rash/bruises her when left too long   Amlodipine Besy-Benazepril Hcl Swelling    LIP swelling   Lactose Intolerance (Gi)    Tilactase Diarrhea    HOME MEDICATIONS: Outpatient Medications Prior to Visit  Medication Sig Dispense Refill   acetaminophen (TYLENOL) 325 MG tablet Take 650 mg by mouth every 6 (six) hours  as needed.     amLODipine (NORVASC) 5 MG tablet Take 5 mg by mouth every morning.      atorvastatin  (LIPITOR) 40 MG tablet Take 1 tablet (40 mg total) by mouth daily. 90 tablet 3   baclofen  (LIORESAL ) 10 MG tablet Take 1 tablet (10 mg total) by mouth 3 (three) times daily. 30 each 11   cyclobenzaprine  (FLEXERIL ) 5 MG tablet Take 1 tablet (5 mg total) by mouth 2 (two) times daily as needed for muscle spasms. (Patient taking differently: Take 5 mg by mouth at bedtime.) 10 tablet 0   diclofenac (VOLTAREN) 75 MG EC tablet Take 75 mg by mouth as needed.     Erenumab -aooe (AIMOVIG ) 140 MG/ML SOAJ Inject 140 mg into the skin every 30 (thirty) days. 1.12 mL 11   famotidine (PEPCID) 20 MG tablet Take 20 mg by mouth daily as needed for heartburn or indigestion.      fluticasone (FLONASE) 50 MCG/ACT nasal spray Place 1 spray into the nose daily as needed for allergies.     ibuprofen  (ADVIL ) 600 MG tablet Take by mouth.     losartan (COZAAR) 100 MG tablet Take 100 mg by mouth every morning.      montelukast (SINGULAIR) 10 MG tablet Take 1 tablet by mouth daily.     pantoprazole (PROTONIX) 40 MG tablet Take 1 tablet by mouth 2 (two) times daily.     topiramate  (TOPAMAX ) 100 MG tablet Take 1 tablet (100 mg total) by mouth daily. 90 tablet 4   triamcinolone cream (KENALOG) 0.1 % As needed     VAGIFEM 10 MCG TABS vaginal tablet Place 1 tablet vaginally 2 (two) times a week.     Zavegepant HCl (ZAVZPRET ) 10 MG/ACT SOLN Place 10 mg into the nose as needed (for migraine). Max dose 1 spray in 24 hours (Patient not taking: Reported on 03/18/2024)     No facility-administered medications prior to visit.    PAST MEDICAL HISTORY: Past Medical History:  Diagnosis Date   Abnormal nuclear stress test 09/25/2015   Back pain    radiating to leg on L side   Chest pain 01/05/2015   Epigastric pain    Essential hypertension, benign 12/11/2013   Hypertension    Migraine    OSA on CPAP    Followed by Dr. Brutus with  [facility]   Pain of upper extremity 01/07/2020   Pseudotumor cerebri    SOB (shortness of breath) 09/16/2016    PAST SURGICAL HISTORY: Past Surgical History:  Procedure Laterality Date   CARDIAC CATHETERIZATION N/A 09/25/2015   Procedure: Left Heart Cath and Coronary Angiography;  Surgeon: Peter M Swaziland, MD;  Location: [facility] INVASIVE CV LAB;  Service: Cardiovascular;  Laterality: N/A;   CESAREAN SECTION      FAMILY HISTORY: Family History  Problem Relation Age of Onset   Hypertension Mother    Diabetes Mother  Migraines Sister    Hypertension Sister    Hypertension Brother    Coronary artery disease Brother     SOCIAL HISTORY: Social History   Socioeconomic History   Marital status: Single    Spouse name: Not on file   Number of children: 1   Years of education: Not on file   Highest education level: Associate degree: occupational, Scientist, product/process development, or vocational program  Occupational History    Comment: home maker  Tobacco Use   Smoking status: Never   Smokeless tobacco: Never  Substance and Sexual Activity   Alcohol use: No   Drug use: No   Sexual activity: Not on file  Other Topics Concern   Not on file  Social History Narrative   Lives with boyfriend and son   Caffeine- tea 1-2 x week   Social Drivers of Corporate investment banker Strain: Low Risk  (05/01/2024)   Received from [facility]   Overall Financial Resource Strain (CARDIA)    Difficulty of Paying Living Expenses: Not hard at all  Food Insecurity: Unknown (05/01/2024)   Received [facility]   Hunger Vital Sign    Within the past 12 months, you worried that your food would run out before you got the money to buy more.: Patient declined    Within the past 12 months, the food you bought just didn't last and you didn't have money to get more.: Never true  Transportation Needs: No Transportation Needs (05/01/2024)   Received from [facility] - Transportation    Lack of  Transportation (Medical): No    Lack of Transportation (Non-Medical): No  Physical Activity: Unknown (05/01/2024)   Received from [facility]   Exercise Vital Sign    On average, how many days per week do you engage in moderate to strenuous exercise (like a brisk walk)?: 0 days    Minutes of Exercise per Session: Not on file  Stress: No Stress Concern Present (05/01/2024)   Received from [facility] - Occupational Stress Questionnaire    Feeling of Stress : Only a little  Social Connections: Socially Integrated (05/01/2024)   Received from [facility]   Social Network    How would you rate your social network (family, work, friends)?: Good participation with social networks  Intimate Partner Violence: Not At Risk (05/01/2024)   Received from [facility]   HITS    Over the last 12 months how often did your partner physically hurt you?: Never    Over the last 12 months how often did your partner insult you or talk down to you?: Never    Over the last 12 months how often did your partner threaten you with physical harm?: Never    Over the last 12 months how often did your partner scream or curse at you?: Never   PHYSICAL EXAM  There were no vitals filed for this visit. There is no height or weight on file to calculate BMI.  Virtual visit  DIAGNOSTIC DATA (LABS, IMAGING, TESTING) - I reviewed patient records, labs, notes, testing and imaging myself where available.  Lab Results  Component Value Date   WBC 5.3 10/11/2016   HGB 13.1 10/11/2016   HCT 40.5 10/11/2016   MCV 92.0 10/11/2016   PLT 276 10/11/2016      Component Value Date/Time   NA 140 03/18/2024 1044   K 4.0 03/18/2024 1044   CL 107 (H) 03/18/2024 1044  CO2 18 (L) 03/18/2024 1044   GLUCOSE 109 (H) 03/18/2024 1044   GLUCOSE 135 (H) 10/11/2016 1422   BUN 16 03/18/2024 1044   CREATININE 1.02 (H) 03/18/2024 1044   CREATININE 0.95 09/16/2015 1038   CALCIUM  9.4  03/18/2024 1044   PROT 6.9 03/18/2024 1044   ALBUMIN 4.2 03/18/2024 1044   AST 26 03/18/2024 1044   ALT 14 04/23/2024 0935   ALKPHOS 112 03/18/2024 1044   BILITOT 0.3 03/18/2024 1044   GFRNONAA >60 10/11/2016 1422   GFRAA >60 10/11/2016 1422   Lab Results  Component Value Date   CHOL 116 07/15/2024   HDL 55 07/15/2024   LDLCALC 45 07/15/2024   TRIG 80 07/15/2024   CHOLHDL 2.1 07/15/2024   No results found for: HGBA1C No results found for: VITAMINB12 No results found for: TSH  Lauraine Born, AGNP-C, DNP 08/06/2024, 1:41 PM [facility] [address] [place], [place] [address] [phone]

## 2024-08-07 ENCOUNTER — Telehealth: Admitting: Neurology

## 2024-08-07 ENCOUNTER — Telehealth: Payer: Self-pay | Admitting: Neurology

## 2024-08-07 DIAGNOSIS — G43019 Migraine without aura, intractable, without status migrainosus: Secondary | ICD-10-CM

## 2024-08-07 DIAGNOSIS — G43709 Chronic migraine without aura, not intractable, without status migrainosus: Secondary | ICD-10-CM | POA: Diagnosis not present

## 2024-08-07 DIAGNOSIS — G932 Benign intracranial hypertension: Secondary | ICD-10-CM

## 2024-08-07 MED ORDER — AIMOVIG 140 MG/ML ~~LOC~~ SOAJ: 140.0000 mg | SUBCUTANEOUS | 11 refills | Status: AC

## 2024-08-07 NOTE — Patient Instructions (Signed)
 Great to meet you today.  Continue current medications.  For acute migraine take baclofen , may combine with NSAID or Tylenol for severe headache.  Continue follow-up with ophthalmology.  I will get those notes.  Please call for worsening symptoms.  I will see you in office in 6 months.  Thanks!!

## 2024-08-07 NOTE — Telephone Encounter (Signed)
 Can we please get notes from Dr. Charmayne eye doctor? Thanks

## 2024-08-08 ENCOUNTER — Telehealth: Payer: Self-pay | Admitting: Neurology

## 2024-08-08 NOTE — Telephone Encounter (Signed)
Patient returned phone call and would like a call back.

## 2024-08-08 NOTE — Telephone Encounter (Signed)
 Pt reports she she has had a headache all morning. She states at one time there was a medication given to her that she would place under her tongue, she would like to know if a Rx could be called in for that again, pt has mentioned there was a nasal spray she was given at one time also, she would like to know which of the two is best,please call.

## 2024-08-08 NOTE — Telephone Encounter (Signed)
Call to patient - no answer. Left message to return call.

## 2025-03-05 ENCOUNTER — Ambulatory Visit: Admitting: Neurology

## 2025-03-05 ENCOUNTER — Telehealth: Admitting: Neurology
